# Patient Record
Sex: Male | Born: 1950 | Race: White | Hispanic: No | Marital: Married | Smoking: Never smoker
Health system: Southern US, Community
[De-identification: ages and names within clinical notes are randomized; demographics above are authoritative.]

## PROBLEM LIST (undated history)

## (undated) DIAGNOSIS — I10 Essential (primary) hypertension: Secondary | ICD-10-CM

## (undated) DIAGNOSIS — K8020 Calculus of gallbladder without cholecystitis without obstruction: Secondary | ICD-10-CM

## (undated) DIAGNOSIS — E119 Type 2 diabetes mellitus without complications: Secondary | ICD-10-CM

## (undated) DIAGNOSIS — F419 Anxiety disorder, unspecified: Secondary | ICD-10-CM

## (undated) DIAGNOSIS — I2510 Atherosclerotic heart disease of native coronary artery without angina pectoris: Secondary | ICD-10-CM

## (undated) DIAGNOSIS — I509 Heart failure, unspecified: Secondary | ICD-10-CM

## (undated) DIAGNOSIS — K635 Polyp of colon: Secondary | ICD-10-CM

## (undated) DIAGNOSIS — J45909 Unspecified asthma, uncomplicated: Secondary | ICD-10-CM

## (undated) DIAGNOSIS — I5030 Unspecified diastolic (congestive) heart failure: Secondary | ICD-10-CM

## (undated) DIAGNOSIS — G4730 Sleep apnea, unspecified: Secondary | ICD-10-CM

## (undated) DIAGNOSIS — I4891 Unspecified atrial fibrillation: Secondary | ICD-10-CM

## (undated) DIAGNOSIS — J189 Pneumonia, unspecified organism: Secondary | ICD-10-CM

## (undated) DIAGNOSIS — D509 Iron deficiency anemia, unspecified: Secondary | ICD-10-CM

## (undated) DIAGNOSIS — N189 Chronic kidney disease, unspecified: Secondary | ICD-10-CM

## (undated) DIAGNOSIS — E785 Hyperlipidemia, unspecified: Secondary | ICD-10-CM

## (undated) DIAGNOSIS — E669 Obesity, unspecified: Secondary | ICD-10-CM

## (undated) HISTORY — PX: UPPER GASTROINTESTINAL ENDOSCOPY: SHX188

## (undated) HISTORY — DX: Hyperlipidemia, unspecified: E78.5

## (undated) HISTORY — DX: Calculus of gallbladder without cholecystitis without obstruction: K80.20

## (undated) HISTORY — DX: Polyp of colon: K63.5

## (undated) HISTORY — PX: CORONARY STENT PLACEMENT: SHX1402

## (undated) HISTORY — DX: Sleep apnea, unspecified: G47.30

## (undated) HISTORY — DX: Chronic kidney disease, unspecified: N18.9

## (undated) HISTORY — DX: Heart failure, unspecified: I50.9

## (undated) HISTORY — DX: Iron deficiency anemia, unspecified: D50.9

## (undated) HISTORY — DX: Obesity, unspecified: E66.9

## (undated) HISTORY — DX: Anxiety disorder, unspecified: F41.9

## (undated) HISTORY — DX: Pneumonia, unspecified organism: J18.9

## (undated) HISTORY — PX: TONSILLECTOMY: SUR1361

## (undated) HISTORY — PX: COLONOSCOPY: SHX174

## (undated) HISTORY — DX: Unspecified diastolic (congestive) heart failure: I50.30

---

## 2006-05-05 ENCOUNTER — Emergency Department (HOSPITAL_COMMUNITY): Admission: EM | Admit: 2006-05-05 | Discharge: 2006-05-05 | Payer: Self-pay | Admitting: Emergency Medicine

## 2012-07-13 DIAGNOSIS — I4891 Unspecified atrial fibrillation: Secondary | ICD-10-CM | POA: Insufficient documentation

## 2012-08-15 DIAGNOSIS — N529 Male erectile dysfunction, unspecified: Secondary | ICD-10-CM | POA: Insufficient documentation

## 2012-08-15 DIAGNOSIS — R001 Bradycardia, unspecified: Secondary | ICD-10-CM | POA: Insufficient documentation

## 2012-08-15 DIAGNOSIS — M2550 Pain in unspecified joint: Secondary | ICD-10-CM | POA: Insufficient documentation

## 2012-08-15 DIAGNOSIS — E119 Type 2 diabetes mellitus without complications: Secondary | ICD-10-CM | POA: Insufficient documentation

## 2012-08-15 DIAGNOSIS — E65 Localized adiposity: Secondary | ICD-10-CM | POA: Insufficient documentation

## 2012-08-15 DIAGNOSIS — F329 Major depressive disorder, single episode, unspecified: Secondary | ICD-10-CM | POA: Insufficient documentation

## 2012-08-15 DIAGNOSIS — R609 Edema, unspecified: Secondary | ICD-10-CM | POA: Insufficient documentation

## 2012-08-15 DIAGNOSIS — E785 Hyperlipidemia, unspecified: Secondary | ICD-10-CM | POA: Insufficient documentation

## 2012-08-15 DIAGNOSIS — I10 Essential (primary) hypertension: Secondary | ICD-10-CM | POA: Insufficient documentation

## 2012-08-15 DIAGNOSIS — I2510 Atherosclerotic heart disease of native coronary artery without angina pectoris: Secondary | ICD-10-CM | POA: Insufficient documentation

## 2012-08-15 DIAGNOSIS — E559 Vitamin D deficiency, unspecified: Secondary | ICD-10-CM | POA: Insufficient documentation

## 2013-02-19 ENCOUNTER — Emergency Department (HOSPITAL_COMMUNITY)
Admission: EM | Admit: 2013-02-19 | Discharge: 2013-02-19 | Disposition: A | Discharge status: Home / Self Care | Payer: 59 | Attending: Emergency Medicine | Admitting: Emergency Medicine

## 2013-02-19 ENCOUNTER — Encounter (HOSPITAL_COMMUNITY): Payer: Self-pay | Admitting: *Deleted

## 2013-02-19 DIAGNOSIS — Z79899 Other long term (current) drug therapy: Secondary | ICD-10-CM | POA: Insufficient documentation

## 2013-02-19 DIAGNOSIS — R209 Unspecified disturbances of skin sensation: Secondary | ICD-10-CM | POA: Insufficient documentation

## 2013-02-19 DIAGNOSIS — Z9861 Coronary angioplasty status: Secondary | ICD-10-CM | POA: Insufficient documentation

## 2013-02-19 DIAGNOSIS — Z8679 Personal history of other diseases of the circulatory system: Secondary | ICD-10-CM | POA: Insufficient documentation

## 2013-02-19 DIAGNOSIS — Z794 Long term (current) use of insulin: Secondary | ICD-10-CM | POA: Insufficient documentation

## 2013-02-19 DIAGNOSIS — I10 Essential (primary) hypertension: Secondary | ICD-10-CM | POA: Insufficient documentation

## 2013-02-19 DIAGNOSIS — J45909 Unspecified asthma, uncomplicated: Secondary | ICD-10-CM | POA: Insufficient documentation

## 2013-02-19 DIAGNOSIS — E119 Type 2 diabetes mellitus without complications: Secondary | ICD-10-CM | POA: Insufficient documentation

## 2013-02-19 DIAGNOSIS — I2510 Atherosclerotic heart disease of native coronary artery without angina pectoris: Secondary | ICD-10-CM | POA: Insufficient documentation

## 2013-02-19 DIAGNOSIS — M542 Cervicalgia: Secondary | ICD-10-CM | POA: Insufficient documentation

## 2013-02-19 HISTORY — DX: Type 2 diabetes mellitus without complications: E11.9

## 2013-02-19 HISTORY — DX: Unspecified atrial fibrillation: I48.91

## 2013-02-19 HISTORY — DX: Essential (primary) hypertension: I10

## 2013-02-19 HISTORY — DX: Atherosclerotic heart disease of native coronary artery without angina pectoris: I25.10

## 2013-02-19 HISTORY — DX: Unspecified asthma, uncomplicated: J45.909

## 2013-02-19 MED ORDER — CYCLOBENZAPRINE HCL 10 MG PO TABS
10.0000 mg | ORAL_TABLET | Freq: Two times a day (BID) | ORAL | Status: DC | PRN
Start: 2013-02-19 — End: 2023-09-01

## 2013-02-19 MED ORDER — HYDROCODONE-ACETAMINOPHEN 5-325 MG PO TABS: 2.0000 | ORAL_TABLET | ORAL | Status: DC | PRN

## 2013-02-19 NOTE — ED Provider Notes (Signed)
History     CSN: [id number]  Arrival date & time 02/19/13  0721   First MD Initiated Contact with Patient 02/19/13 [phone]      Chief Complaint  Patient presents with  . Shoulder Pain     HPI Pt states woke up with right shoulder pain yesterday and today now has numbness. Pt reports that pain hurts with movement, but concerned because he does have heart problems.  Past Medical History  Diagnosis Date  . Coronary artery disease   . Diabetes mellitus without complication   . Hypertension   . Atrial fibrillation   . Asthma     Past Surgical History  Procedure Laterality Date  . Coronary stent placement    . Tonsillectomy      No family history on file.  History  Substance Use Topics  . Smoking status: Never Smoker   . Smokeless tobacco: Not on file  . Alcohol Use: No      Review of Systems All other systems reviewed and are negative Allergies  Glucophage  Home Medications   Current Outpatient Rx  Name  Route  Sig  Dispense  Refill  . amiodarone (PACERONE) 200 MG tablet   Oral   Take 200 mg by mouth daily.         Marland Kitchen apixaban (ELIQUIS) 5 MG TABS tablet   Oral   Take 5 mg by mouth 2 (two) times daily.         Marland Kitchen atorvastatin (LIPITOR) 40 MG tablet   Oral   Take 40 mg by mouth daily.         . cholecalciferol (VITAMIN D) 1000 UNITS tablet   Oral   Take 2,000 Units by mouth daily.         . insulin NPH (HUMULIN N,NOVOLIN N) 100 UNIT/ML injection   Subcutaneous   Inject 40 Units into the skin 2 (two) times daily.         . insulin regular (HUMULIN R) 100 units/mL injection   Subcutaneous   Inject 20-30 Units into the skin 3 (three) times daily before meals. 30 units in am, 20 units at lunch and 30 unkits pm         . irbesartan (AVAPRO) 150 MG tablet   Oral   Take 150 mg by mouth daily.         . cyclobenzaprine (FLEXERIL) 10 MG tablet   Oral   Take 1 tablet (10 mg total) by mouth 2 (two) times daily as needed for muscle spasms.   20  tablet   0   . HYDROcodone-acetaminophen (NORCO/VICODIN) 5-325 MG per tablet   Oral   Take 2 tablets by mouth every 4 (four) hours as needed for pain.   10 tablet   0     BP 184/46  Pulse 61  Temp(Src) 97.5 F (36.4 C) (Oral)  Resp 18  SpO2 94%  Physical Exam  Nursing note and vitals reviewed. Constitutional: He is oriented to person, place, and time. He appears well-developed and well-nourished. No distress.  HENT:  Head: Normocephalic and atraumatic.  Eyes: Pupils are equal, round, and reactive to light.  Neck: Normal range of motion. Muscular tenderness present. No spinous process tenderness present. No mass present.    Pain is reproducible to palpation were indicated  Cardiovascular: Normal rate and intact distal pulses.   Pulmonary/Chest: No respiratory distress.  Abdominal: Normal appearance. He exhibits no distension.  Musculoskeletal: Normal range of motion.  Neurological: He is alert  and oriented to person, place, and time. No cranial nerve deficit.  Skin: Skin is warm and dry. No rash noted.  Psychiatric: He has a normal mood and affect. His behavior is normal.    ED Course  Procedures (including critical care time)  Date: 02/19/2013  Rate: 60  Rhythm: normal sinus rhythm  QRS Axis: Left   Intervals: normal  ST/T Wave abnormalities: normal  Conduction Disutrbances: none  Narrative Interpretation: Abnormal EKG     Labs Reviewed - No data to display No results found.   1. Musculoskeletal neck pain       MDM         Nelia Shi, MD 02/19/13 [phone]

## 2013-02-19 NOTE — ED Notes (Signed)
Pt states woke up with right shoulder pain yesterday and today now has numbness.  Pt reports that pain hurts with movement, but concerned because he does have heart problems.

## 2013-02-19 NOTE — ED Notes (Signed)
NAD noted at time of d/c home 

## 2013-02-21 ENCOUNTER — Emergency Department (HOSPITAL_COMMUNITY)
Admission: EM | Admit: 2013-02-21 | Discharge: 2013-02-21 | Disposition: A | Discharge status: Home / Self Care | Payer: 59 | Attending: Emergency Medicine | Admitting: Emergency Medicine

## 2013-02-21 ENCOUNTER — Emergency Department (HOSPITAL_COMMUNITY): Payer: 59

## 2013-02-21 ENCOUNTER — Encounter (HOSPITAL_COMMUNITY): Payer: Self-pay | Admitting: *Deleted

## 2013-02-21 DIAGNOSIS — Z794 Long term (current) use of insulin: Secondary | ICD-10-CM | POA: Insufficient documentation

## 2013-02-21 DIAGNOSIS — M25519 Pain in unspecified shoulder: Secondary | ICD-10-CM | POA: Insufficient documentation

## 2013-02-21 DIAGNOSIS — E119 Type 2 diabetes mellitus without complications: Secondary | ICD-10-CM | POA: Insufficient documentation

## 2013-02-21 DIAGNOSIS — M542 Cervicalgia: Secondary | ICD-10-CM | POA: Insufficient documentation

## 2013-02-21 DIAGNOSIS — J45909 Unspecified asthma, uncomplicated: Secondary | ICD-10-CM | POA: Insufficient documentation

## 2013-02-21 DIAGNOSIS — Z8679 Personal history of other diseases of the circulatory system: Secondary | ICD-10-CM | POA: Insufficient documentation

## 2013-02-21 DIAGNOSIS — Z9861 Coronary angioplasty status: Secondary | ICD-10-CM | POA: Insufficient documentation

## 2013-02-21 DIAGNOSIS — I10 Essential (primary) hypertension: Secondary | ICD-10-CM | POA: Insufficient documentation

## 2013-02-21 DIAGNOSIS — M25511 Pain in right shoulder: Secondary | ICD-10-CM

## 2013-02-21 DIAGNOSIS — I2510 Atherosclerotic heart disease of native coronary artery without angina pectoris: Secondary | ICD-10-CM | POA: Insufficient documentation

## 2013-02-21 DIAGNOSIS — Z79899 Other long term (current) drug therapy: Secondary | ICD-10-CM | POA: Insufficient documentation

## 2013-02-21 LAB — CBC WITH DIFFERENTIAL/PLATELET
Eosinophils Relative: 2 % (ref 0–5)
HCT: 41.4 % (ref 39.0–52.0)
Lymphocytes Relative: 30 % (ref 12–46)
MCH: 27.9 pg (ref 26.0–34.0)
MCV: 82.6 fL (ref 78.0–100.0)
Monocytes Absolute: 1 10*3/uL (ref 0.1–1.0)
Monocytes Relative: 9 % (ref 3–12)
Neutro Abs: 5.9 10*3/uL (ref 1.7–7.7)
Neutrophils Relative %: 58 % (ref 43–77)
Platelets: 304 10*3/uL (ref 150–400)
RDW: 13.7 % (ref 11.5–15.5)

## 2013-02-21 LAB — BASIC METABOLIC PANEL
BUN: 16 mg/dL (ref 6–23)
Creatinine, Ser: 1.18 mg/dL (ref 0.50–1.35)

## 2013-02-21 MED ORDER — HYDROCODONE-ACETAMINOPHEN 5-325 MG PO TABS: 1.0000 | ORAL_TABLET | Freq: Four times a day (QID) | ORAL | Status: DC | PRN

## 2013-02-21 MED ORDER — PREDNISONE 20 MG PO TABS: 40.0000 mg | ORAL_TABLET | Freq: Every day | ORAL | Status: DC

## 2013-02-21 NOTE — ED Provider Notes (Signed)
History     CSN: [id number]  Arrival date & time 02/21/13  1302   First MD Initiated Contact with Patient 02/21/13 1505      Chief Complaint  Patient presents with  . Shoulder Pain  . Neck Pain    (Consider location/radiation/quality/duration/timing/severity/associated sxs/prior treatment) Patient is a 62 y.o. male presenting with shoulder pain and neck pain.  Shoulder Pain  Neck Pain  Pt with history of CAD and atrial fibrillation reports he woke up about 2 days ago with moderate to severe aching R shoulder pain, radiating into R neck. He was seen for similar 2 days ago and treated for presumed muscle spasm with Norco and Flexeril. He has not had much improvement. Wife reports he is inconsistently compliant with Lasix, typically goes days without taking his daily dose and then takes several doses back to back. He denies any trauma, no previous history of pain in this area. Different from cardiac pain with his stent. He has some paresthesia to R shoulder, but no pain or numbness radiating down the arm.   Past Medical History  Diagnosis Date  . Coronary artery disease   . Diabetes mellitus without complication   . Hypertension   . Atrial fibrillation   . Asthma     Past Surgical History  Procedure Laterality Date  . Coronary stent placement    . Tonsillectomy      History reviewed. No pertinent family history.  History  Substance Use Topics  . Smoking status: Never Smoker   . Smokeless tobacco: Not on file  . Alcohol Use: No      Review of Systems  HENT: Positive for neck pain.    All other systems reviewed and are negative except as noted in HPI.   Allergies  Glucophage  Home Medications   Current Outpatient Rx  Name  Route  Sig  Dispense  Refill  . amiodarone (PACERONE) 200 MG tablet   Oral   Take 200 mg by mouth daily.         Marland Kitchen apixaban (ELIQUIS) 5 MG TABS tablet   Oral   Take 5 mg by mouth 2 (two) times daily.         Marland Kitchen atorvastatin  (LIPITOR) 40 MG tablet   Oral   Take 40 mg by mouth daily.         . cholecalciferol (VITAMIN D) 1000 UNITS tablet   Oral   Take 2,000 Units by mouth daily.         . cyclobenzaprine (FLEXERIL) 10 MG tablet   Oral   Take 1 tablet (10 mg total) by mouth 2 (two) times daily as needed for muscle spasms.   20 tablet   0   . furosemide (LASIX) 40 MG tablet   Oral   Take 40 mg by mouth daily.         Marland Kitchen HYDROcodone-acetaminophen (NORCO/VICODIN) 5-325 MG per tablet   Oral   Take 2 tablets by mouth every 4 (four) hours as needed for pain.   10 tablet   0   . insulin NPH (HUMULIN N,NOVOLIN N) 100 UNIT/ML injection   Subcutaneous   Inject 40 Units into the skin 2 (two) times daily. 30 units in the morning.  40-50 units in the evening.         . insulin regular (HUMULIN R) 100 units/mL injection   Subcutaneous   Inject 20 Units into the skin 3 (three) times daily before meals. 20 unit morning, 40 units  lunch, and 30 units in the evening.         . irbesartan (AVAPRO) 150 MG tablet   Oral   Take 150 mg by mouth daily.           BP 156/70  Pulse 55  Temp(Src) 98 F (36.7 C) (Oral)  Resp 23  Ht 5\' 8"  (1.727 m)  Wt 275 lb (124.739 kg)  BMI 41.82 kg/m2  SpO2 95%  Physical Exam  Nursing note and vitals reviewed. Constitutional: He is oriented to person, place, and time. He appears well-developed and well-nourished.  HENT:  Head: Normocephalic and atraumatic.  Eyes: EOM are normal. Pupils are equal, round, and reactive to light.  Neck: Normal range of motion. Neck supple.  Cardiovascular: Normal rate, normal heart sounds and intact distal pulses.   Pulmonary/Chest: Effort normal and breath sounds normal.  Abdominal: Bowel sounds are normal. He exhibits no distension. There is no tenderness.  Musculoskeletal: Normal range of motion. He exhibits edema (bilatateral LE mild) and tenderness (R AC joint and soft tissue of supraclavicular and trapezius area, no bony  cervical spine tenderness).  Neurological: He is alert and oriented to person, place, and time. He has normal strength. No cranial nerve deficit or sensory deficit.  Skin: Skin is warm and dry. No rash noted.  Psychiatric: He has a normal mood and affect.    ED Course  Procedures (including critical care time)  Labs Reviewed  BASIC METABOLIC PANEL - Abnormal; Notable for the following:    GFR calc non Af Amer 63 (*)    GFR calc Af Amer 74 (*)    All other components within normal limits  CBC WITH DIFFERENTIAL   Dg Cervical Spine Complete  02/21/2013  *RADIOLOGY REPORT*  Clinical Data: Shoulder pain and neck pain.  CERVICAL SPINE - COMPLETE 4+ VIEW  Comparison: None.  Findings: Mild to moderate multilevel disc space narrowing, worst at C6-C7.  Foraminal narrowing is worst at C6-C7 on the right. Lung apices clear.  Bilateral carotid calcification.  Negative odontoid.  No osseous destruction.  IMPRESSION:  Spondylosis as described.   Original Report Authenticated By: Davonna Belling, M.D.    Dg Shoulder Right  02/21/2013  *RADIOLOGY REPORT*  Clinical Data: Right shoulder and neck pain for 3 days, no known injury  RIGHT SHOULDER - 2+ VIEW  Comparison: None  Findings: Mild AC joint degenerative changes. Osseous mineralization grossly normal. No acute fracture, dislocation or bone destruction. Visualized right ribs intact.  IMPRESSION: No acute abnormalities. Mild degenerative changes right AC joint.   Original Report Authenticated By: Ulyses Southward, M.D.      1. Shoulder pain, right       MDM  Labs unremarkable. Xrays as above show some arthritis. I suspect this is the cause of his pain which is clearly musculoskeletal. Could be a radiculopathy component as well. Continue with hydrocodone, short course of steroids. PCP followup already scheduled for next week.        Melis Trochez B. Bernette Mayers, MD 02/21/13 [id number]

## 2013-02-21 NOTE — ED Notes (Signed)
Pt was seen here Tues am for R shoulder pain and numbness and d/c'd with pain r/t nerves.  Pain increases with movement.  Pt is on his last hydrocodone and the pain is not improving, so pt would like to figure out what is going on.  States his wife thinks pain is r/t low K b/c pt is on lasix but no labs were drawn last time pt was here.

## 2013-02-21 NOTE — ED Notes (Signed)
Pt being transported to x-ray

## 2013-08-14 DIAGNOSIS — Z6841 Body Mass Index (BMI) 40.0 and over, adult: Secondary | ICD-10-CM | POA: Insufficient documentation

## 2014-08-21 DIAGNOSIS — Z79899 Other long term (current) drug therapy: Secondary | ICD-10-CM | POA: Insufficient documentation

## 2014-11-01 IMAGING — CR DG CERVICAL SPINE COMPLETE 4+V
5 series · 5 of 5 positions shown · non-contrast
Comparison: None.

CLINICAL DATA: Shoulder pain and neck pain.

CERVICAL SPINE - COMPLETE 4+ VIEW

[w c-spine lat *]
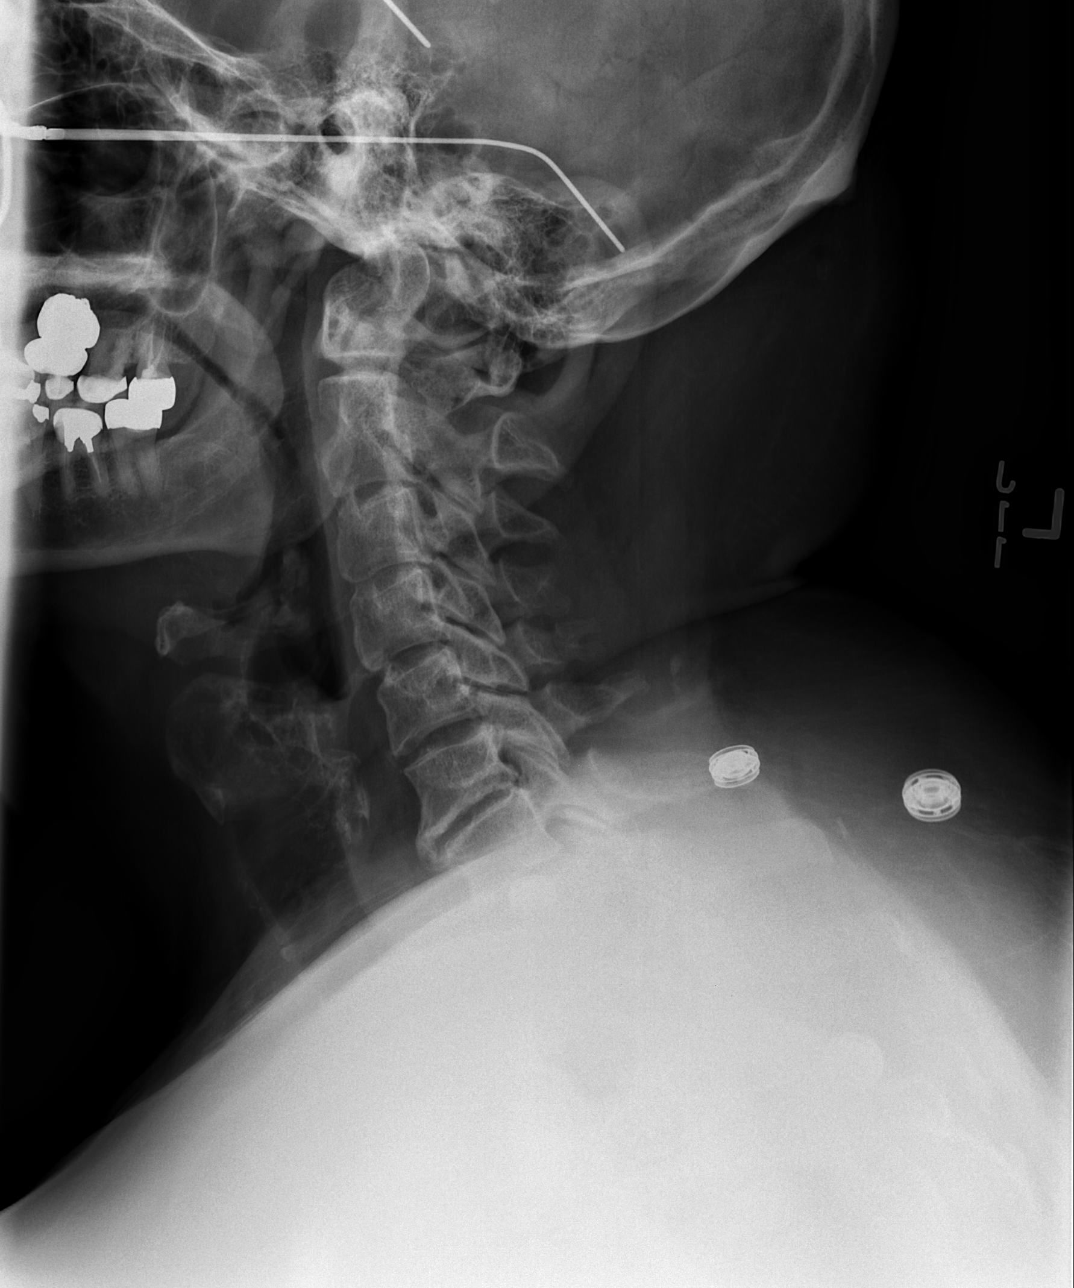

[w c-spine oblique (1 of 2)]
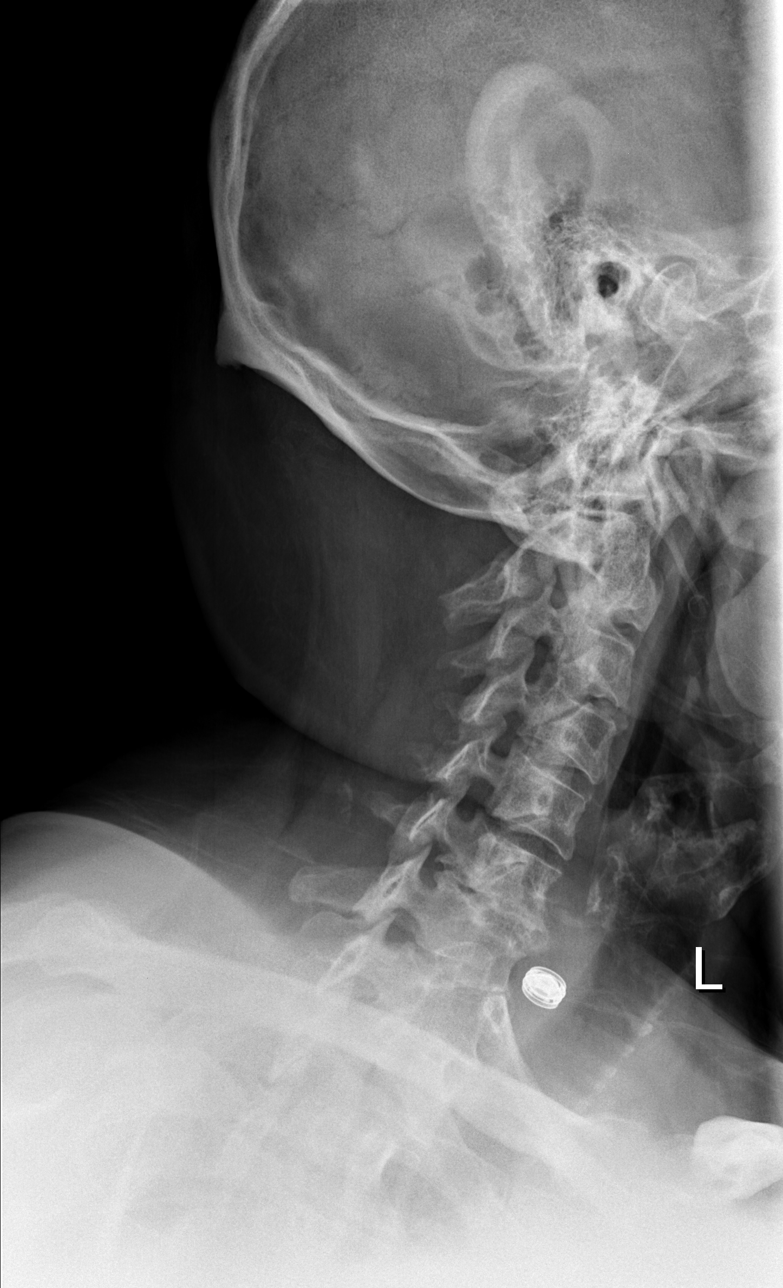

[w c-spine oblique (2 of 2)]
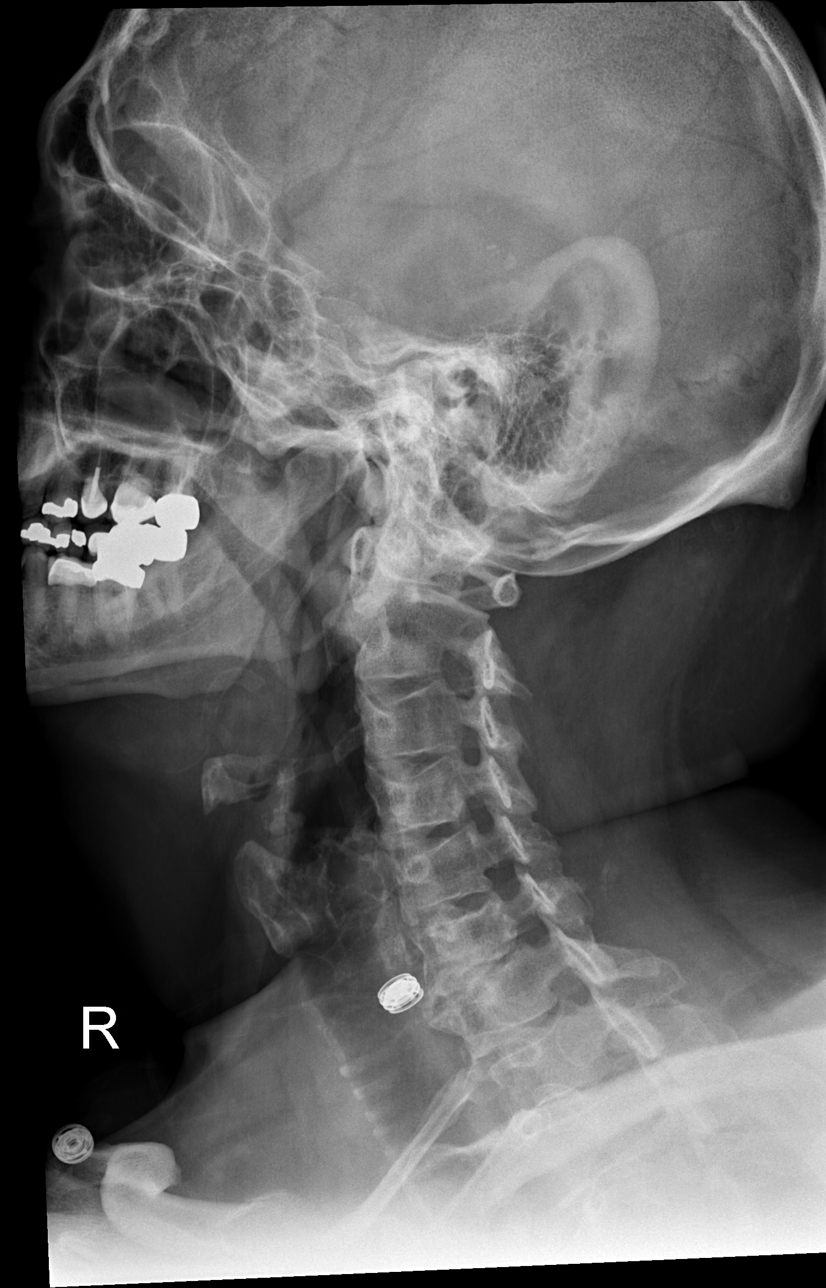

[w c-spine a.p.]
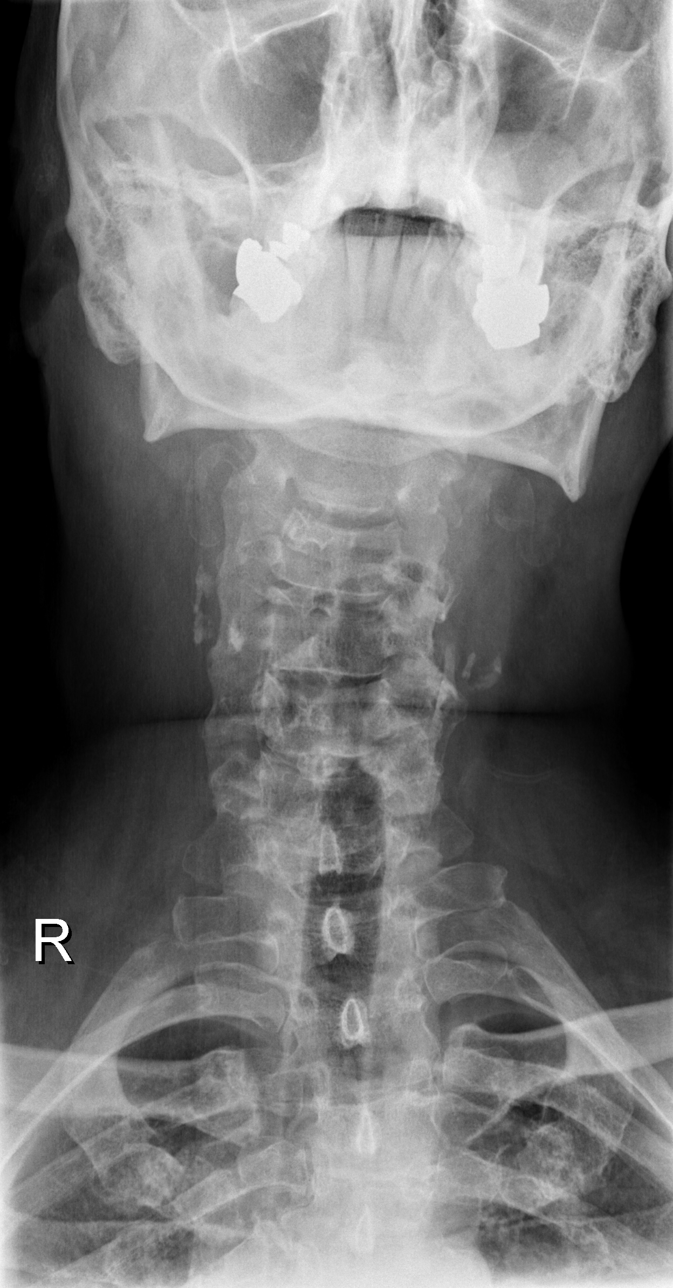

[w c-spine odontoid *]
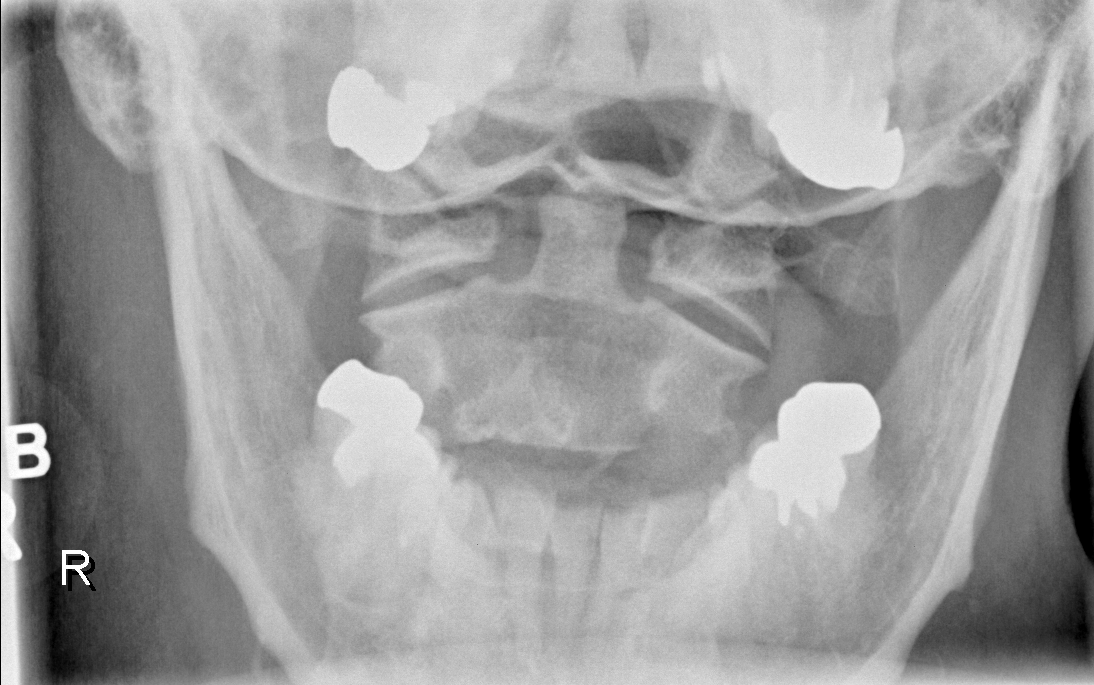

[5 of 5 positions shown; findings below may reference images not displayed]

FINDINGS: Mild to moderate multilevel disc space narrowing, worst
at C6-C7.  Foraminal narrowing is worst at C6-C7 on the right.
Lung apices clear.  Bilateral carotid calcification.  Negative
odontoid.  No osseous destruction.
IMPRESSION: Spondylosis as described.

## 2015-09-29 DIAGNOSIS — G4733 Obstructive sleep apnea (adult) (pediatric): Secondary | ICD-10-CM | POA: Insufficient documentation

## 2016-05-04 DIAGNOSIS — Z955 Presence of coronary angioplasty implant and graft: Secondary | ICD-10-CM | POA: Insufficient documentation

## 2017-04-19 DIAGNOSIS — R0602 Shortness of breath: Secondary | ICD-10-CM | POA: Insufficient documentation

## 2018-09-21 ENCOUNTER — Ambulatory Visit: Payer: Medicare Other | Admitting: Podiatry

## 2018-09-21 ENCOUNTER — Encounter: Payer: Self-pay | Admitting: Podiatry

## 2018-09-21 ENCOUNTER — Other Ambulatory Visit: Payer: Self-pay

## 2018-09-21 VITALS — BP 127/58 | HR 51

## 2018-09-21 DIAGNOSIS — M79675 Pain in left toe(s): Secondary | ICD-10-CM

## 2018-09-21 DIAGNOSIS — M79674 Pain in right toe(s): Secondary | ICD-10-CM

## 2018-09-21 DIAGNOSIS — E1142 Type 2 diabetes mellitus with diabetic polyneuropathy: Secondary | ICD-10-CM | POA: Diagnosis not present

## 2018-09-21 DIAGNOSIS — L84 Corns and callosities: Secondary | ICD-10-CM

## 2018-09-21 DIAGNOSIS — B351 Tinea unguium: Secondary | ICD-10-CM

## 2018-09-21 NOTE — Patient Instructions (Addendum)
Diabetes and Foot Care Diabetes may cause you to have problems because of poor blood supply (circulation) to your feet and legs. This may cause the skin on your feet to become thinner, break easier, and heal more slowly. Your skin may become dry, and the skin may peel and crack. You may also have nerve damage in your legs and feet causing decreased feeling in them. You may not notice minor injuries to your feet that could lead to infections or more serious problems. Taking care of your feet is one of the most important things you can do for yourself. Follow these instructions at home:  Wear shoes at all times, even in the house. Do not go barefoot. Bare feet are easily injured.  Check your feet daily for blisters, cuts, and redness. If you cannot see the bottom of your feet, use a mirror or ask someone for help.  Wash your feet with warm water (do not use hot water) and mild soap. Then pat your feet and the areas between your toes until they are completely dry. Do not soak your feet as this can dry your skin.  Apply a moisturizing lotion or petroleum jelly (that does not contain alcohol and is unscented) to the skin on your feet and to dry, brittle toenails. Do not apply lotion between your toes.  Trim your toenails straight across. Do not dig under them or around the cuticle. File the edges of your nails with an emery board or nail file.  Do not cut corns or calluses or try to remove them with medicine.  Wear clean socks or stockings every day. Make sure they are not too tight. Do not wear knee-high stockings since they may decrease blood flow to your legs.  Wear shoes that fit properly and have enough cushioning. To break in new shoes, wear them for just a few hours a day. This prevents you from injuring your feet. Always look in your shoes before you put them on to be sure there are no objects inside.  Do not cross your legs. This may decrease the blood flow to your feet.  If you find a  minor scrape, cut, or break in the skin on your feet, keep it and the skin around it clean and dry. These areas may be cleansed with mild soap and water. Do not cleanse the area with peroxide, alcohol, or iodine.  When you remove an adhesive bandage, be sure not to damage the skin around it.  If you have a wound, look at it several times a day to make sure it is healing.  Do not use heating pads or hot water bottles. They may burn your skin. If you have lost feeling in your feet or legs, you may not know it is happening until it is too late.  Make sure your health care provider performs a complete foot exam at least annually or more often if you have foot problems. Report any cuts, sores, or bruises to your health care provider immediately. Contact a health care provider if:  You have an injury that is not healing.  You have cuts or breaks in the skin.  You have an ingrown nail.  You notice redness on your legs or feet.  You feel burning or tingling in your legs or feet.  You have pain or cramps in your legs and feet.  Your legs or feet are numb.  Your feet always feel cold. Get help right away if:  There is increasing   redness, swelling, or pain in or around a wound.  There is a red line that goes up your leg.  Pus is coming from a wound.  You develop a fever or as directed by your health care provider.  You notice a bad smell coming from an ulcer or wound. This information is not intended to replace advice given to you by your health care provider. Make sure you discuss any questions you have with your health care provider. Document Released: 11/04/2000 Document Revised: 04/14/2016 Document Reviewed: 04/16/2013 Elsevier Interactive Patient Education  2017 [employer]  Diabetic Neuropathy Diabetic neuropathy is a nerve disease or nerve damage that is caused by diabetes mellitus. About half of all people with diabetes mellitus have some form of nerve damage. Nerve damage  is more common in those who have had diabetes mellitus for many years and who generally have not had good control of their blood sugar (glucose) level. Diabetic neuropathy is a common complication of diabetes mellitus. There are three common types of diabetic neuropathy and a fourth type that is less common and less understood:  Peripheral neuropathy-This is the most common type of diabetic neuropathy. It causes damage to the nerves of the feet and legs first and then eventually the hands and arms. The damage affects the ability to sense touch.  Autonomic neuropathy-This type causes damage to the autonomic nervous system, which controls the following functions: ? Heartbeat. ? Body temperature. ? Blood pressure. ? Urination. ? Digestion. ? Sweating. ? Sexual function.  Focal neuropathy-Focal neuropathy can be painful and unpredictable and occurs most often in older adults with diabetes mellitus. It involves a specific nerve or one area and often comes on suddenly. It usually does not cause long-term problems.  Radiculoplexus neuropathy- Sometimes called lumbosacral radiculoplexus neuropathy, radiculoplexus neuropathy affects the nerves of the thighs, hips, buttocks, or legs. It is more common in people with type 2 diabetes mellitus and in older men. It is characterized by debilitating pain, weakness, and atrophy, usually in the thigh muscles.  What are the causes? The cause of peripheral, autonomic, and focal neuropathies is diabetes mellitus that is uncontrolled and high glucose levels. The cause of radiculoplexus neuropathy is unknown. However, it is thought to be caused by inflammation related to uncontrolled glucose levels. What are the signs or symptoms? Peripheral Neuropathy Peripheral neuropathy develops slowly over time. When the nerves of the feet and legs no longer work there may be:  Burning, stabbing, or aching pain in the legs or feet.  Inability to feel pressure or pain in your  feet. This can lead to: ? Thick calluses over pressure areas. ? Pressure sores. ? Ulcers.  Foot deformities.  Reduced ability to feel temperature changes.  Muscle weakness.  Autonomic Neuropathy The symptoms of autonomic neuropathy vary depending on which nerves are affected. Symptoms may include:  Problems with digestion, such as: ? Feeling sick to your stomach (nausea). ? Vomiting. ? Bloating. ? Constipation. ? Diarrhea. ? Abdominal pain.  Difficulty with urination. This occurs if you lose your ability to sense when your bladder is full. Problems include: ? Urine leakage (incontinence). ? Inability to empty your bladder completely (retention).  Rapid or irregular heartbeat (palpitations).  Blood pressure drops when you stand up (orthostatic hypotension). When you stand up you may feel: ? Dizzy. ? Weak. ? Faint.  In men, inability to attain and maintain an erection.  In women, vaginal dryness and problems with decreased sexual desire and arousal.  Problems with body temperature   regulation.  Increased or decreased sweating.  Focal Neuropathy  Abnormal eye movements or abnormal alignment of both eyes.  Weakness in the wrist.  Foot drop. This results in an inability to lift the foot properly and abnormal walking or foot movement.  Paralysis on one side of your face (Bell palsy).  Chest or abdominal pain. Radiculoplexus Neuropathy  Sudden, severe pain in your hip, thigh, or buttocks.  Weakness and wasting of thigh muscles.  Difficulty rising from a seated position.  Abdominal swelling.  Unexplained weight loss (usually more than 10 lb [4.5 kg]). How is this diagnosed? Peripheral Neuropathy Your senses may be tested. Sensory function testing can be done with:  A light touch using a monofilament.  A vibration with tuning fork.  A sharp sensation with a pin prick.  Other tests that can help diagnose neuropathy are:  Nerve conduction velocity. This  test checks the transmission of an electrical current through a nerve.  Electromyography. This shows how muscles respond to electrical signals transmitted by nearby nerves.  Quantitative sensory testing. This is used to assess how your nerves respond to vibrations and changes in temperature.  Autonomic Neuropathy Diagnosis is often based on reported symptoms. Tell your health care provider if you experience:  Dizziness.  Constipation.  Diarrhea.  Inappropriate urination or inability to urinate.  Inability to get or maintain an erection.  Tests that may be done include:  Electrocardiography or Holter monitor. These are tests that can help show problems with the heart rate or heart rhythm.  An X-ray exam may be done.  Focal Neuropathy Diagnosis is made based on your symptoms and what your health care provider finds during your exam. Other tests may be done. They may include:  Nerve conduction velocities. This checks the transmission of electrical current through a nerve.  Electromyography. This shows how muscles respond to electrical signals transmitted by nearby nerves.  Quantitative sensory testing. This test is used to assess how your nerves respond to vibration and changes in temperature.  Radiculoplexus Neuropathy  Often the first thing is to eliminate any other issue or problems that might be the cause, as there is no standard test for diagnosis.  X-ray exam of your spine and lumbar region.  Spinal tap to rule out cancer.  MRI to rule out other lesions. How is this treated? Once nerve damage occurs, it cannot be reversed. The goal of treatment is to keep the disease or nerve damage from getting worse and affecting more nerve fibers. Controlling your blood glucose level is the key. Most people with radiculoplexus neuropathy see at least a partial improvement over time. You will need to keep your blood glucose and HbA1c levels in the target range determined by your  health care provider. Things that help control blood glucose levels include:  Blood glucose monitoring.  Meal planning.  Physical activity.  Diabetes medicine.  Over time, maintaining lower blood glucose levels helps lessen symptoms. Sometimes, prescription pain medicine is needed. Follow these instructions at home:  Do not smoke.  Keep your blood glucose level in the range that you and your health care provider have determined acceptable for you.  Keep your blood pressure level in the range that you and your health care provider have determined acceptable for you.  Eat a well-balanced diet.  Be physically active every day. Include strength training and balance exercises.  Protect your feet. ? Check your feet every day for sores, cuts, blisters, or signs of infection. ? Wear padded socks   and supportive shoes. Use orthotic inserts, if necessary. ? Regularly check the insides of your shoes for worn spots. Make sure there are no rocks or other items inside your shoes before you put them on. Contact a health care provider if:  You have burning, stabbing, or aching pain in the legs or feet.  You are unable to feel pressure or pain in your feet.  You develop problems with digestion such as: ? Nausea. ? Vomiting. ? Bloating. ? Constipation. ? Diarrhea. ? Abdominal pain.  You have difficulty with urination, such as: ? Incontinence. ? Retention.  You have palpitations.  You develop orthostatic hypotension. When you stand up you may feel: ? Dizzy. ? Weak. ? Faint.  You cannot attain and maintain an erection (in men).  You have vaginal dryness and problems with decreased sexual desire and arousal (in women).  You have severe pain in your thighs, legs, or buttocks.  You have unexplained weight loss. This information is not intended to replace advice given to you by your health care provider. Make sure you discuss any questions you have with your health care  provider. Document Released: 01/16/2002 Document Revised: 04/14/2016 Document Reviewed: 04/18/2013 Elsevier Interactive Patient Education  2017 [employer]  

## 2018-10-08 ENCOUNTER — Encounter: Payer: Self-pay | Admitting: Podiatry

## 2018-10-08 NOTE — Progress Notes (Signed)
Subjective: George Boyer presents to clinic today with  cc calluses bilateral great toes and second and third digits.  Patient states condition is been present for some time.  He became more concerned because of his diabetes.   He also relates long-standing problem with elongated, painful, discolored, thick toenails both feet which interfere with daily activities.  Pain is aggravated when wearing enclosed shoe gear. Pain is getting progressively worse.  His primary care doctor is Dr. Teodoro Spray in [place].  Last visit with Dr. Karen Kitchens was May 03, 2018.  Medical History   Date Unknown Asthma  Date Unknown Atrial fibrillation [facility])  Date Unknown Coronary artery disease  Date Unknown Diabetes mellitus without complication (HCC)  Date Unknown Hypertension  Mark as Reviewed   Cardiovascular and Mediastinum  CAD in native artery   HTN (hypertension)   Unspecified atrial fibrillation (HCC)   Respiratory  Obstructive sleep apnea   Endocrine  T2DM (type 2 diabetes mellitus) (HCC)   Other  Anxiety and depression   Arthralgia   Body mass index 40.0-44.9, adult (HCC)   Bradycardia   Central obesity   Dependent edema   Dyslipidemia   Erectile dysfunction   High risk medication use   S/P coronary artery stent placement   SOB (shortness of breath) on exertion   Vitamin D deficiency    Surgical History   Date Unknown Coronary stent placement  Date Unknown Tonsillectomy   Medications    fluticasone (FLOVENT HFA) 220 MCG/ACT inhaler    nitroGLYCERIN (NITROSTAT) 0.4 MG SL tablet    ACCU-CHEK AVIVA PLUS test strip    amiodarone (PACERONE) 200 MG tablet    amLODipine (NORVASC) 5 MG tablet    apixaban (ELIQUIS) 5 MG TABS tablet    atorvastatin (LIPITOR) 40 MG tablet    benzonatate (TESSALON) 100 MG capsule    buPROPion (WELLBUTRIN XL) 150 MG 24 hr tablet    cholecalciferol (VITAMIN D) 1000 UNITS tablet    cyclobenzaprine (FLEXERIL) 10 MG tablet    furosemide (LASIX) 40 MG  tablet    HYDROcodone-acetaminophen (NORCO/VICODIN) 5-325 MG per tablet    hydrocortisone 2.5 % ointment    insulin NPH (HUMULIN N,NOVOLIN N) 100 UNIT/ML injection    insulin regular (HUMULIN R) 100 units/mL injection    irbesartan (AVAPRO) 150 MG tablet    pravastatin (PRAVACHOL) 20 MG tablet    predniSONE (DELTASONE) 20 MG tablet    ranolazine (RANEXA) 500 MG 12 hr tablet    Vitamin D, Ergocalciferol, (DRISDOL) 50000 units CAPS capsule    Allergies     BupropionOther (See Comments)  Glucophage [Metformin Hcl]Diarrhea   Tobacco History   Smoking Status  Never Smoker  Smokeless Tobacco Status  Never Used   Family History   None   Review of systems: Constitutional: Denies chills fatigue fever sweats weight change Eyes: Denies diplopia glare light sensitivity Ears nose mouth throat: Denies vertigo denies bloody nose rhinitis denies cold sores and snoring Cardiovascular: +HTN, Denies chest pain tightness Respiratory: +Dyspnea on exertion, +asthma, Denies difficulty breathing, denies congestion Gastrointestinal: Denies abdominal pain, diarrhea, nausea, vomiting Genitourinary: Denies nocturia, pain on urination, blood in urine Musculoskeletal: Denies cramping Skin: +changes in toenails, denies color change dryness, itchy skin, mole changes, or rash  Neurological: Denies fainting, denies seizure, denies change in speech.  Endocrine: Denies dry mouth, denies flushing, denies heat intolerance, denies cold intolerance, denies excessive thirst, denies polyuria, denies nocturia Hematological: Denies easy bleeding, excessive bleeding, easy bruising, denies enlarged lymph nodes Allergy/immunological:  Denies hives denies frequent infections . Objective: Vitals:   09/21/18 0802  BP: (!) 127/58  Pulse: (!) 51   Vascular Examination: Capillary refill time <3 seconds x 10 digits Dorsalis pedis pulses 2/4 bilaterally  Posterior tibial pulses are faintly palpable bilaterally b/l No digital  hair x 10 digits Skin temperature warm to warm b/l  Dermatological Examination: Skin with normal turgor, texture and tone bilaterally  Toenails 1-5 b/l discolored, thick, dystrophic with subungual debris and pain with palpation to nailbeds due to thickness of nails.  Hyperkeratotic lesions noted submetatarsal heads 1, 5 bilaterally distal tip left second and third digits, distal tip right second digit, plantar medial hallux IPJ bilaterally  Musculoskeletal: Muscle strength 5/5 to all LE muscle groups  Neurological: Sensation intact with 10 gram monofilament. Vibratory sensation diminished b/l  Assessment: 1. Painful onychomycosis toenails 1-5 b/l  2.  Calluses submetatarsal heads 1, 5 bilaterally , plantar medial hallux IPJ bilaterally 3.  Corns distal tip left second and third digits, distal tip right second digit 4. NIDDM with neuropathy  Plan: 1. Discussed diabetic foot care principles.  Literature was dispensed on diabetic foot care and diabetic neuropathy. 2. Toenails 1-5 b/l were debrided in length and girth without iatrogenic bleeding. 3. All hyperkeratotic were pared with sterile blade submetatarsal heads 1 formal bilaterally, medial hallux IPJ bilaterally, distal tip left third digit, distal tip bilateral second digits. 4. Patient does qualify for Medicare approved diabetic shoe gear based on his examination today.  Supporting diagnoses include NIDDM with neuropathy, multiple callosities as above.  Paperwork will be completed and forwarded to our orthotics and prosthetics department.  Once they receive certification from his primary care physician will be scheduled for an appointment to be measured for his shoes and insoles. 5. Patient to continue soft, supportive shoe gear 6. Patient to report any pedal injuries to medical professional immediately. 7. Follow up 3 months. Patient/POA to call should there be a concern in the interim.

## 2018-10-16 ENCOUNTER — Other Ambulatory Visit: Payer: Self-pay | Admitting: Internal Medicine

## 2018-10-16 DIAGNOSIS — N183 Chronic kidney disease, stage 3 unspecified: Secondary | ICD-10-CM

## 2018-10-24 ENCOUNTER — Ambulatory Visit
Admission: RE | Admit: 2018-10-24 | Discharge: 2018-10-24 | Disposition: A | Discharge status: Home / Self Care | Payer: Self-pay | Source: Ambulatory Visit | Attending: Internal Medicine | Admitting: Internal Medicine

## 2018-10-24 DIAGNOSIS — N183 Chronic kidney disease, stage 3 unspecified: Secondary | ICD-10-CM

## 2018-12-21 ENCOUNTER — Ambulatory Visit: Payer: Medicare Other | Admitting: Podiatry

## 2018-12-26 ENCOUNTER — Ambulatory Visit: Payer: Medicare Other | Admitting: Podiatry

## 2018-12-26 ENCOUNTER — Ambulatory Visit: Payer: Medicare Other | Admitting: Orthotics

## 2018-12-26 DIAGNOSIS — L84 Corns and callosities: Secondary | ICD-10-CM

## 2018-12-26 DIAGNOSIS — M79675 Pain in left toe(s): Secondary | ICD-10-CM | POA: Diagnosis not present

## 2018-12-26 DIAGNOSIS — E1142 Type 2 diabetes mellitus with diabetic polyneuropathy: Secondary | ICD-10-CM

## 2018-12-26 DIAGNOSIS — B351 Tinea unguium: Secondary | ICD-10-CM

## 2018-12-26 DIAGNOSIS — M79674 Pain in right toe(s): Principal | ICD-10-CM

## 2018-12-26 NOTE — Patient Instructions (Signed)

## 2018-12-30 ENCOUNTER — Encounter: Payer: Self-pay | Admitting: Podiatry

## 2018-12-30 NOTE — Progress Notes (Addendum)
Subjective: George Boyer to clinic for preventative foot care. He has h/o diabetic neuropathy.   He is seen for painful, mycotic toenails and corns and calluses. He is picking up his diabetic shoes on today as well.  Teodoro Spray, MD is his PCP and last visit was 05/03/2018.  He is also on Eliquis.   Current Outpatient Medications:  .  ACCU-CHEK AVIVA PLUS test strip, USE TO TEST BLOOD SUGARS FOUR TIMES DAILY DX E11.9 AND Z79.4, Disp: , Rfl: 1 .  amiodarone (PACERONE) 200 MG tablet, Take 200 mg by mouth daily., Disp: , Rfl:  .  amLODipine (NORVASC) 5 MG tablet, TK 1 T PO QAM, Disp: , Rfl: 0 .  apixaban (ELIQUIS) 5 MG TABS tablet, Take 5 mg by mouth 2 (two) times daily., Disp: , Rfl:  .  atorvastatin (LIPITOR) 40 MG tablet, Take 40 mg by mouth daily., Disp: , Rfl:  .  benzonatate (TESSALON) 100 MG capsule, TK ONE C PO TID FOR 10 DAYS, Disp: , Rfl: 0 .  buPROPion (WELLBUTRIN XL) 150 MG 24 hr tablet, TAKE 1 TABLET BY MOUTH EVERY DAY IN THE MORNING, Disp: , Rfl: 0 .  cholecalciferol (VITAMIN D) 1000 UNITS tablet, Take 2,000 Units by mouth daily., Disp: , Rfl:  .  cyclobenzaprine (FLEXERIL) 10 MG tablet, Take 1 tablet (10 mg total) by mouth 2 (two) times daily as needed for muscle spasms., Disp: 20 tablet, Rfl: 0 .  fluticasone (FLOVENT HFA) 220 MCG/ACT inhaler, Inhale into the lungs., Disp: , Rfl:  .  furosemide (LASIX) 40 MG tablet, Take 40 mg by mouth daily., Disp: , Rfl:  .  HYDROcodone-acetaminophen (NORCO/VICODIN) 5-325 MG per tablet, Take 1-2 tablets by mouth every 6 (six) hours as needed for pain., Disp: 30 tablet, Rfl: 0 .  hydrocortisone 2.5 % ointment, APP TOPICALLY AA BID FOR 7 DAYS, Disp: , Rfl: 0 .  insulin NPH (HUMULIN N,NOVOLIN N) 100 UNIT/ML injection, Inject 40 Units into the skin 2 (two) times daily. 30 units in the morning.  40-50 units in the evening., Disp: , Rfl:  .  insulin regular (HUMULIN R) 100 units/mL injection, Inject 20 Units into the skin 3 (three) times daily  before meals. 20 unit morning, 40 units lunch, and 30 units in the evening., Disp: , Rfl:  .  irbesartan (AVAPRO) 150 MG tablet, Take 150 mg by mouth daily., Disp: , Rfl:  .  nitroGLYCERIN (NITROSTAT) 0.4 MG SL tablet, Place under the tongue., Disp: , Rfl:  .  pravastatin (PRAVACHOL) 20 MG tablet, TK 1 T PO HS, Disp: , Rfl: 0 .  predniSONE (DELTASONE) 20 MG tablet, Take 2 tablets (40 mg total) by mouth daily., Disp: 10 tablet, Rfl: 0 .  ranolazine (RANEXA) 500 MG 12 hr tablet, TK 1 T PO BID, Disp: , Rfl: 11 .  Vitamin D, Ergocalciferol, (DRISDOL) 50000 units CAPS capsule, TK ONE C PO TWICE WEEKLY, Disp: , Rfl: 0  Allergies  Allergen Reactions  . Bupropion Other (See Comments)    Tremors  . Glucophage [Metformin Hcl] Diarrhea    Vascular Examination: Capillary refill time <3 seconds x 10 digits Dorsalis pedis 2/4 b/l Posterior tibial pulses faintly palpable b/l No digital hair x 10 digits Skin temperature WNL b/l  Dermatological Examination: Skin with normal turgor, texture and tone b/l Toenails 1-5 b/l discolored, thick, dystrophic with subungual debris and pain with palpation to nailbeds due to thickness of nails.  Hyperkeratotic lesion submetatarsal head 1, 5 b/l, distal tip left  2nd, 3rd digits, distal tip right 2nd digit and medial hallux IPJ b/l.  Musculoskeletal: Muscle strength 5/5 to all LE muscle groups  Neurological: Sensation intact with 10 gram monofilament b/l. Vibratory sensation diminished b/l.  Assessment: 1. Painful onychomycosis toenails 1-5 b/l 2. Callus/Corn submetatarsal head 1, 5 b/l, distal tip left 2nd, 3rd digits, distal tip right 2nd digit and medial hallux IPJ b/l 3. NIDDM with Diabetic neuropathy  Plan: 1. Continue diabetic foot care principles.  2. Toenails 1-5 b/l were debrided in length and girth without iatrogenic bleeding. 3. Hyperkeratotic lesion pared with sterile chisel blade submetatarsal head 1, 5 b/l, distal tip left 2nd, 3rd digits,  distal tip right 2nd digit and medial hallux IPJ b/l without incident.  4. Patient to continue soft, supportive shoe gear 5. Patient to report any pedal injuries to medical professional  6. Follow up 3 months.  7. Patient/POA to call should there be a concern in the interim.

## 2019-02-08 ENCOUNTER — Ambulatory Visit: Payer: Medicare Other | Admitting: Orthotics

## 2019-02-18 NOTE — Progress Notes (Signed)

## 2019-02-28 ENCOUNTER — Ambulatory Visit (INDEPENDENT_AMBULATORY_CARE_PROVIDER_SITE_OTHER): Payer: Medicare Other | Admitting: Orthotics

## 2019-02-28 ENCOUNTER — Other Ambulatory Visit: Payer: Self-pay

## 2019-02-28 DIAGNOSIS — E1142 Type 2 diabetes mellitus with diabetic polyneuropathy: Secondary | ICD-10-CM

## 2019-02-28 DIAGNOSIS — L84 Corns and callosities: Secondary | ICD-10-CM

## 2019-02-28 NOTE — Progress Notes (Signed)

## 2019-03-06 ENCOUNTER — Ambulatory Visit: Payer: Medicare Other | Admitting: Orthotics

## 2019-03-26 ENCOUNTER — Other Ambulatory Visit: Payer: Self-pay

## 2019-03-26 ENCOUNTER — Ambulatory Visit (INDEPENDENT_AMBULATORY_CARE_PROVIDER_SITE_OTHER): Payer: Medicare Other

## 2019-03-26 ENCOUNTER — Ambulatory Visit: Payer: Medicare Other | Admitting: Podiatry

## 2019-03-26 ENCOUNTER — Encounter: Payer: Self-pay | Admitting: Podiatry

## 2019-03-26 VITALS — Temp 97.7°F

## 2019-03-26 DIAGNOSIS — M79674 Pain in right toe(s): Secondary | ICD-10-CM

## 2019-03-26 DIAGNOSIS — L84 Corns and callosities: Secondary | ICD-10-CM | POA: Diagnosis not present

## 2019-03-26 DIAGNOSIS — M6740 Ganglion, unspecified site: Secondary | ICD-10-CM | POA: Diagnosis not present

## 2019-03-26 DIAGNOSIS — E1142 Type 2 diabetes mellitus with diabetic polyneuropathy: Secondary | ICD-10-CM

## 2019-03-26 DIAGNOSIS — B351 Tinea unguium: Secondary | ICD-10-CM

## 2019-03-26 DIAGNOSIS — M79675 Pain in left toe(s): Secondary | ICD-10-CM | POA: Diagnosis not present

## 2019-03-26 NOTE — Patient Instructions (Addendum)
Ganglion Cyst  A ganglion cyst is a non-cancerous, fluid-filled lump that occurs near a joint or tendon. The cyst grows out of a joint or the lining of a tendon. Ganglion cysts most often develop in the hand or wrist, but they can also develop in the shoulder, elbow, hip, knee, ankle, or foot. Ganglion cysts are ball-shaped or egg-shaped. Their size can range from the size of a pea to larger than a grape. Increased activity may cause the cyst to get bigger because more fluid starts to build up. What are the causes? The exact cause of this condition is not known, but it may be related to:  Inflammation or irritation around the joint.  An injury.  Repetitive movements or overuse.  Arthritis. What increases the risk? You are more likely to develop this condition if:  You are a woman.  You are 40-59 years old. What are the signs or symptoms? The main symptom of this condition is a lump. It most often appears on the hand or wrist. In many cases, there are no other symptoms, but a cyst can sometimes cause:  Tingling.  Pain.  Numbness.  Muscle weakness.  Weak grip.  Less range of motion in a joint. How is this diagnosed? Ganglion cysts are usually diagnosed based on a physical exam. Your health care provider will feel the lump and may shine a light next to it. If it is a ganglion cyst, the light will likely shine through it. Your health care provider may order an X-ray, ultrasound, or MRI to rule out other conditions. How is this treated? Ganglion cysts often go away on their own without treatment. If you have pain or other symptoms, treatment may be needed. Treatment is also needed if the ganglion cyst limits your movement or if it gets infected. Treatment may include:  Wearing a brace or splint on your wrist or finger.  Taking anti-inflammatory medicine.  Having fluid drained from the lump with a needle (aspiration).  Getting a steroid injected into the joint.  Having  surgery to remove the ganglion cyst.  Placing a pad on your shoe or wearing shoes that will not rub against the cyst if it is on your foot. Follow these instructions at home:  Do not press on the ganglion cyst, poke it with a needle, or hit it.  Take over-the-counter and prescription medicines only as told by your health care provider.  If you have a brace or splint: ? Wear it as told by your health care provider. ? Remove it as told by your health care provider. Ask if you need to remove it when you take a shower or a bath.  Watch your ganglion cyst for any changes.  Keep all follow-up visits as told by your health care provider. This is important. Contact a health care provider if:  Your ganglion cyst becomes larger or more painful.  You have pus coming from the lump.  You have weakness or numbness in the affected area.  You have a fever or chills. Get help right away if:  You have a fever and have any of these in the cyst area: ? Increased redness. ? Red streaks. ? Swelling. Summary  A ganglion cyst is a non-cancerous, fluid-filled lump that occurs near a joint or tendon.  Ganglion cysts most often develop in the hand or wrist, but they can also develop in the shoulder, elbow, hip, knee, ankle, or foot.  Ganglion cysts often go away on their own without treatment.  This information is not intended to replace advice given to you by your health care provider. Make sure you discuss any questions you have with your health care provider. Document Released: 11/04/2000 Document Revised: 07/07/2017 Document Reviewed: 07/07/2017 Elsevier Interactive Patient Education  2019 [employer]  Diabetes Mellitus and Foot Care Foot care is an important part of your health, especially when you have diabetes. Diabetes may cause you to have problems because of poor blood flow (circulation) to your feet and legs, which can cause your skin to:  Become thinner and drier.  Break more  easily.  Heal more slowly.  Peel and crack. You may also have nerve damage (neuropathy) in your legs and feet, causing decreased feeling in them. This means that you may not notice minor injuries to your feet that could lead to more serious problems. Noticing and addressing any potential problems early is the best way to prevent future foot problems. How to care for your feet Foot hygiene  Wash your feet daily with warm water and mild soap. Do not use hot water. Then, pat your feet and the areas between your toes until they are completely dry. Do not soak your feet as this can dry your skin.  Trim your toenails straight across. Do not dig under them or around the cuticle. File the edges of your nails with an emery board or nail file.  Apply a moisturizing lotion or petroleum jelly to the skin on your feet and to dry, brittle toenails. Use lotion that does not contain alcohol and is unscented. Do not apply lotion between your toes. Shoes and socks  Wear clean socks or stockings every day. Make sure they are not too tight. Do not wear knee-high stockings since they may decrease blood flow to your legs.  Wear shoes that fit properly and have enough cushioning. Always look in your shoes before you put them on to be sure there are no objects inside.  To break in new shoes, wear them for just a few hours a day. This prevents injuries on your feet. Wounds, scrapes, corns, and calluses  Check your feet daily for blisters, cuts, bruises, sores, and redness. If you cannot see the bottom of your feet, use a mirror or ask someone for help.  Do not cut corns or calluses or try to remove them with medicine.  If you find a minor scrape, cut, or break in the skin on your feet, keep it and the skin around it clean and dry. You may clean these areas with mild soap and water. Do not clean the area with peroxide, alcohol, or iodine.  If you have a wound, scrape, corn, or callus on your foot, look at it  several times a day to make sure it is healing and not infected. Check for: ? Redness, swelling, or pain. ? Fluid or blood. ? Warmth. ? Pus or a bad smell. General instructions  Do not cross your legs. This may decrease blood flow to your feet.  Do not use heating pads or hot water bottles on your feet. They may burn your skin. If you have lost feeling in your feet or legs, you may not know this is happening until it is too late.  Protect your feet from hot and cold by wearing shoes, such as at the beach or on hot pavement.  Schedule a complete foot exam at least once a year (annually) or more often if you have foot problems. If you have foot problems, report  any cuts, sores, or bruises to your health care provider immediately. Contact a health care provider if:  You have a medical condition that increases your risk of infection and you have any cuts, sores, or bruises on your feet.  You have an injury that is not healing.  You have redness on your legs or feet.  You feel burning or tingling in your legs or feet.  You have pain or cramps in your legs and feet.  Your legs or feet are numb.  Your feet always feel cold.  You have pain around a toenail. Get help right away if:  You have a wound, scrape, corn, or callus on your foot and: ? You have pain, swelling, or redness that gets worse. ? You have fluid or blood coming from the wound, scrape, corn, or callus. ? Your wound, scrape, corn, or callus feels warm to the touch. ? You have pus or a bad smell coming from the wound, scrape, corn, or callus. ? You have a fever. ? You have a red line going up your leg. Summary  Check your feet every day for cuts, sores, red spots, swelling, and blisters.  Moisturize feet and legs daily.  Wear shoes that fit properly and have enough cushioning.  If you have foot problems, report any cuts, sores, or bruises to your health care provider immediately.  Schedule a complete foot exam  at least once a year (annually) or more often if you have foot problems. This information is not intended to replace advice given to you by your health care provider. Make sure you discuss any questions you have with your health care provider. Document Released: 11/04/2000 Document Revised: 12/20/2017 Document Reviewed: 12/09/2016 Elsevier Interactive Patient Education  2019 [employer]

## 2019-04-01 NOTE — Progress Notes (Signed)
Subjective: George Boyer presents today for preventative diabetic foot care with painful, thick toenails 1-5 b/l that he cannot cut and which interfere with daily activities.  Pain is aggravated when wearing enclosed shoe gear and relieved with periodic professional debridement.  Patient notes new concern of lump noted on the dorsum of his right foot.  States it appeared about 2-3 weeks ago. Onset was sudden. He notes it may be related to the new pair of shoes he received about that same time.  He notes no pain, no redness, no warmth. Only aggravated when pressure is applied. It is not limiting his activities.  George Spray, MD is his PCP. Last visit December 17, 2018.   Current Outpatient Medications:  .  ACCU-CHEK AVIVA PLUS test strip, USE TO TEST BLOOD SUGARS FOUR TIMES DAILY DX E11.9 AND Z79.4, Disp: , Rfl: 1 .  Accu-Chek FastClix Lancets MISC, Use to monitor blood glucose 4 time(s) daily DX CODE E11.9 and Z79.4, Disp: , Rfl:  .  amiodarone (PACERONE) 200 MG tablet, Take 200 mg by mouth daily., Disp: , Rfl:  .  amLODipine (NORVASC) 5 MG tablet, TK 1 T PO QAM, Disp: , Rfl: 0 .  apixaban (ELIQUIS) 5 MG TABS tablet, Take 5 mg by mouth 2 (two) times daily., Disp: , Rfl:  .  atorvastatin (LIPITOR) 40 MG tablet, Take 40 mg by mouth daily., Disp: , Rfl:  .  benzonatate (TESSALON) 100 MG capsule, TK ONE C PO TID FOR 10 DAYS, Disp: , Rfl: 0 .  buPROPion (WELLBUTRIN XL) 150 MG 24 hr tablet, TAKE 1 TABLET BY MOUTH EVERY DAY IN THE MORNING, Disp: , Rfl: 0 .  cholecalciferol (VITAMIN D) 1000 UNITS tablet, Take 2,000 Units by mouth daily., Disp: , Rfl:  .  cyanocobalamin (,VITAMIN B-12,) 1000 MCG/ML injection, INJECT 1 ML IN THE MUSCLE EVERY 30 DAYS, Disp: , Rfl:  .  cyclobenzaprine (FLEXERIL) 10 MG tablet, Take 1 tablet (10 mg total) by mouth 2 (two) times daily as needed for muscle spasms., Disp: 20 tablet, Rfl: 0 .  fluticasone (FLOVENT HFA) 220 MCG/ACT inhaler, Inhale into the lungs., Disp: , Rfl:   .  furosemide (LASIX) 40 MG tablet, Take 40 mg by mouth daily., Disp: , Rfl:  .  HYDROcodone-acetaminophen (NORCO/VICODIN) 5-325 MG per tablet, Take 1-2 tablets by mouth every 6 (six) hours as needed for pain., Disp: 30 tablet, Rfl: 0 .  hydrocortisone 2.5 % ointment, APP TOPICALLY AA BID FOR 7 DAYS, Disp: , Rfl: 0 .  insulin NPH (HUMULIN N,NOVOLIN N) 100 UNIT/ML injection, Inject 40 Units into the skin 2 (two) times daily. 30 units in the morning.  40-50 units in the evening., Disp: , Rfl:  .  insulin regular (HUMULIN R) 100 units/mL injection, Inject 20 Units into the skin 3 (three) times daily before meals. 20 unit morning, 40 units lunch, and 30 units in the evening., Disp: , Rfl:  .  Insulin Syringe-Needle U-100 (INSULIN SYRINGE 1CC/31GX5/16") 31G X 5/16" 1 ML MISC, Use with insulin five (5) times daily  DX E11.42 and Z79.4, Disp: , Rfl:  .  irbesartan (AVAPRO) 150 MG tablet, Take 150 mg by mouth daily., Disp: , Rfl:  .  nitroGLYCERIN (NITROSTAT) 0.4 MG SL tablet, Place under the tongue., Disp: , Rfl:  .  pravastatin (PRAVACHOL) 20 MG tablet, TK 1 T PO HS, Disp: , Rfl: 0 .  predniSONE (DELTASONE) 20 MG tablet, Take 2 tablets (40 mg total) by mouth daily., Disp: 10 tablet,  Rfl: 0 .  ranolazine (RANEXA) 500 MG 12 hr tablet, TK 1 T PO BID, Disp: , Rfl: 11 .  Vitamin D, Ergocalciferol, (DRISDOL) 50000 units CAPS capsule, TK ONE C PO TWICE WEEKLY, Disp: , Rfl: 0  Allergies  Allergen Reactions  . Bupropion Other (See Comments)    Tremors  . Glucophage [Metformin Hcl] Diarrhea    Objective:  Vascular Examination: Capillary refill time <3 seconds x 10 digits.  Dorsalis pedis 2/4 b/l.  Posterior tibial pulses faintly palpable b/l.  Digital hair absent x 10 digits.  Skin temperature gradient WNL b/l.  Dermatological Examination: Skin with normal turgor, texture and tone b/l.  Toenails 1-5 b/l discolored, thick, dystrophic with subungual debris and pain with palpation to nailbeds due  to thickness of nails.  Hyperkeratotic lesions submet head 1, 5 b/l, distal tip left 2nd, 3rd digits, distal tip right 2nd digit, medial hallux IPJ b/l.Marland Kitchen. No erythema, no edema, no drainage, no flocculence noted with any of these lesions.  Musculoskeletal: Muscle strength 5/5 to all LE muscle groups.       Palpable firm, fixed nodule noted over dorsal forefoot 2nd webspace, along extensor tendon. No erythema, no edema, no drainage, no flocculence, no warmth.  No suspicion of abscess.  No gross bony deformities b/l.  No pain, crepitus or joint limitation noted with ROM.   Neurological: Sensation intact 5/5 with 10 gram monofilament.  Vibratory sensation diminished.  Xrays right foot reveal calcified vessels, early posterior calcaneal spur formation.  No gas in tissues.  Assessment: 1. Painful onychomycosis toenails 1-5 b/l  2. Ganglion cyst right foot  Plan: 1. Discussed ganglion cyst's etiology and course, conservative vs surgical treatment options available. Xray right foot taken/reviewed with George Boyer. Offered in-office aspiration of ganglion cyst, but patient refused on today stating he will wait until it becomes symptomatic. He has also had vascular studies done in the remote past, so any surgical intervention would depend on his peripheral vascular integrity. 2. Toenails 1-5 b/l were debrided in length and girth without iatrogenic bleeding. 3. Patient to continue soft, supportive shoe gear daily. 4. Patient to report any pedal injuries to medical professional immediately. 5. Follow up 9 weeks for diabetic foot care; he will call office if ganglion cyst of right foot becomes symptomatic.  6. Patient/POA to call should there be a concern in the interim.

## 2019-04-02 ENCOUNTER — Encounter: Payer: Self-pay | Admitting: Podiatry

## 2019-06-12 ENCOUNTER — Encounter: Payer: Self-pay | Admitting: Podiatry

## 2019-06-12 ENCOUNTER — Other Ambulatory Visit: Payer: Self-pay

## 2019-06-12 ENCOUNTER — Ambulatory Visit: Payer: Medicare Other | Admitting: Podiatry

## 2019-06-12 DIAGNOSIS — B351 Tinea unguium: Secondary | ICD-10-CM | POA: Diagnosis not present

## 2019-06-12 DIAGNOSIS — M79674 Pain in right toe(s): Secondary | ICD-10-CM | POA: Diagnosis not present

## 2019-06-12 DIAGNOSIS — M79675 Pain in left toe(s): Secondary | ICD-10-CM

## 2019-06-12 DIAGNOSIS — E1142 Type 2 diabetes mellitus with diabetic polyneuropathy: Secondary | ICD-10-CM | POA: Diagnosis not present

## 2019-06-12 DIAGNOSIS — L84 Corns and callosities: Secondary | ICD-10-CM

## 2019-06-12 NOTE — Patient Instructions (Signed)

## 2019-06-16 NOTE — Progress Notes (Signed)
Subjective: George Boyer presents today with painful, thick toenails 1-5 b/l that he cannot cut and which interfere with daily activities.  Pain is aggravated when wearing enclosed shoe gear.  Patient states the ganglion cyst on the dorsal 2nd webspace right foot has shrunk and is no longer visible.  Teodoro Sprayrujillo, Jamie, MD is his PCP.    Current Outpatient Medications:  .  ACCU-CHEK AVIVA PLUS test strip, USE TO TEST BLOOD SUGARS FOUR TIMES DAILY DX E11.9 AND Z79.4, Disp: , Rfl: 1 .  Accu-Chek FastClix Lancets MISC, Use to monitor blood glucose 4 time(s) daily DX CODE E11.9 and Z79.4, Disp: , Rfl:  .  amiodarone (PACERONE) 200 MG tablet, Take 200 mg by mouth daily., Disp: , Rfl:  .  amLODipine (NORVASC) 5 MG tablet, TK 1 T PO QAM, Disp: , Rfl: 0 .  apixaban (ELIQUIS) 5 MG TABS tablet, Take 5 mg by mouth 2 (two) times daily., Disp: , Rfl:  .  atorvastatin (LIPITOR) 40 MG tablet, Take 40 mg by mouth daily., Disp: , Rfl:  .  benzonatate (TESSALON) 100 MG capsule, TK ONE C PO TID FOR 10 DAYS, Disp: , Rfl: 0 .  buPROPion (WELLBUTRIN XL) 150 MG 24 hr tablet, TAKE 1 TABLET BY MOUTH EVERY DAY IN THE MORNING, Disp: , Rfl: 0 .  cholecalciferol (VITAMIN D) 1000 UNITS tablet, Take 2,000 Units by mouth daily., Disp: , Rfl:  .  cyanocobalamin (,VITAMIN B-12,) 1000 MCG/ML injection, INJECT 1 ML IN THE MUSCLE EVERY 30 DAYS, Disp: , Rfl:  .  cyclobenzaprine (FLEXERIL) 10 MG tablet, Take 1 tablet (10 mg total) by mouth 2 (two) times daily as needed for muscle spasms., Disp: 20 tablet, Rfl: 0 .  fluticasone (FLOVENT HFA) 220 MCG/ACT inhaler, Inhale into the lungs., Disp: , Rfl:  .  furosemide (LASIX) 40 MG tablet, Take 40 mg by mouth daily., Disp: , Rfl:  .  HYDROcodone-acetaminophen (NORCO/VICODIN) 5-325 MG per tablet, Take 1-2 tablets by mouth every 6 (six) hours as needed for pain., Disp: 30 tablet, Rfl: 0 .  hydrocortisone 2.5 % ointment, APP TOPICALLY AA BID FOR 7 DAYS, Disp: , Rfl: 0 .  insulin NPH (HUMULIN  N,NOVOLIN N) 100 UNIT/ML injection, Inject 40 Units into the skin 2 (two) times daily. 30 units in the morning.  40-50 units in the evening., Disp: , Rfl:  .  insulin regular (HUMULIN R) 100 units/mL injection, Inject 20 Units into the skin 3 (three) times daily before meals. 20 unit morning, 40 units lunch, and 30 units in the evening., Disp: , Rfl:  .  Insulin Syringe-Needle U-100 (INSULIN SYRINGE 1CC/31GX5/16") 31G X 5/16" 1 ML MISC, Use with insulin five (5) times daily  DX E11.42 and Z79.4, Disp: , Rfl:  .  irbesartan (AVAPRO) 150 MG tablet, Take 150 mg by mouth daily., Disp: , Rfl:  .  nitroGLYCERIN (NITROSTAT) 0.4 MG SL tablet, Place under the tongue., Disp: , Rfl:  .  pravastatin (PRAVACHOL) 20 MG tablet, TK 1 T PO HS, Disp: , Rfl: 0 .  predniSONE (DELTASONE) 20 MG tablet, Take 2 tablets (40 mg total) by mouth daily., Disp: 10 tablet, Rfl: 0 .  ranolazine (RANEXA) 500 MG 12 hr tablet, TK 1 T PO BID, Disp: , Rfl: 11 .  Vitamin D, Ergocalciferol, (DRISDOL) 50000 units CAPS capsule, TK ONE C PO TWICE WEEKLY, Disp: , Rfl: 0  Allergies  Allergen Reactions  . Bupropion Other (See Comments)    Tremors  . Glucophage [Metformin Hcl]  Diarrhea    Objective: Vascular Examination: Capillary refill time < 3 seconds x 10 digits.  Dorsalis pedis and Posterior tibial pulses palpable b/l.  Digital hair absent x 10 digits.  Skin temperature gradient WNL b/l.  Dermatological Examination: Skin with normal turgor, texture and tone b/l.  Toenails 1-5 b/l discolored, thick, dystrophic with subungual debris and pain with palpation to nailbeds due to thickness of nails.  Hyperkeratotic lesion submet head 1, 5 b/l, distal tip left 2nd and 3rd digits, distal tip right 2nd digit, medial hallux IP b/l with tenderness to palpation. No edema, no erythema, no drainage, no flocculence.  Musculoskeletal: Muscle strength 5/5 to all LE muscle groups.  No gross bony deformities b/l.  No pain, crepitus or  joint limitation noted with ROM.   Neurological: Sensation intact 5/5 b/l with 10 gram monofilament.  Vibratory sensation diminished b/l.  Assessment: Painful onychomycosis toenails 1-5 b/l  Corns and callosities submet head 1, 5 b/l, distal tip left 2nd and 3rd digits, distal tip right 2nd digit, medial hallux IP b/l   Plan: 1. Toenails 1-5 b/l were debrided in length and girth without iatrogenic bleeding. 2. Corns and calluses pared submet head 1, 5 b/l, distal tip left 2nd and 3rd digits, distal tip right 2nd digit, medial hallux IPJ b/lutilizing sterile scalpel blade without incident. 3. Continue to watch ganglion cyst right 2nd webspace should it become symptomatic.  4. Patient to continue soft, supportive shoe gear daily. 5. Patient to report any pedal injuries to medical professional immediately. 6. Follow up 9 weeks. 7. Patient/POA to call should there be a concern in the interim.

## 2019-07-11 DIAGNOSIS — R809 Proteinuria, unspecified: Secondary | ICD-10-CM | POA: Insufficient documentation

## 2019-07-11 DIAGNOSIS — E1122 Type 2 diabetes mellitus with diabetic chronic kidney disease: Secondary | ICD-10-CM | POA: Insufficient documentation

## 2019-07-11 DIAGNOSIS — D631 Anemia in chronic kidney disease: Secondary | ICD-10-CM | POA: Insufficient documentation

## 2019-08-14 ENCOUNTER — Ambulatory Visit (INDEPENDENT_AMBULATORY_CARE_PROVIDER_SITE_OTHER): Payer: Medicare Other | Admitting: Podiatry

## 2019-08-14 ENCOUNTER — Other Ambulatory Visit: Payer: Self-pay

## 2019-08-14 ENCOUNTER — Encounter: Payer: Self-pay | Admitting: Podiatry

## 2019-08-14 DIAGNOSIS — L84 Corns and callosities: Secondary | ICD-10-CM

## 2019-08-14 DIAGNOSIS — M79674 Pain in right toe(s): Secondary | ICD-10-CM | POA: Diagnosis not present

## 2019-08-14 DIAGNOSIS — E119 Type 2 diabetes mellitus without complications: Secondary | ICD-10-CM

## 2019-08-14 DIAGNOSIS — Z794 Long term (current) use of insulin: Secondary | ICD-10-CM | POA: Diagnosis not present

## 2019-08-14 DIAGNOSIS — M79675 Pain in left toe(s): Secondary | ICD-10-CM

## 2019-08-14 DIAGNOSIS — E1142 Type 2 diabetes mellitus with diabetic polyneuropathy: Secondary | ICD-10-CM

## 2019-08-14 DIAGNOSIS — B351 Tinea unguium: Secondary | ICD-10-CM | POA: Diagnosis not present

## 2019-08-14 NOTE — Patient Instructions (Signed)
Diabetic Neuropathy Diabetic neuropathy refers to nerve damage that is caused by diabetes (diabetes mellitus). Over time, people with diabetes can develop nerve damage throughout the body. There are several types of diabetic neuropathy:  Peripheral neuropathy. This is the most common type of diabetic neuropathy. It causes damage to nerves that carry signals between the spinal cord and other parts of the body (peripheral nerves). This usually affects nerves in the feet and legs first, and may eventually affect the hands and arms. The damage affects the ability to sense touch or temperature.  Autonomic neuropathy. This type causes damage to nerves that control involuntary functions (autonomic nerves). These nerves carry signals that control: ? Heartbeat. ? Body temperature. ? Blood pressure. ? Urination. ? Digestion. ? Sweating. ? Sexual function. ? Response to changing blood sugar (glucose) levels.  Focal neuropathy. This type of nerve damage affects one area of the body, such as an arm, a leg, or the face. The injury may involve one nerve or a small group of nerves. Focal neuropathy can be painful and unpredictable, and occurs most often in older adults with diabetes. This often develops suddenly, but usually improves over time and does not cause long-term problems.  Proximal neuropathy. This type of nerve damage affects the nerves of the thighs, hips, buttocks, or legs. It causes severe pain, weakness, and muscle death (atrophy), usually in the thigh muscles. It is more common among older men and people who have type 2 diabetes. The length of recovery time may vary. What are the causes? Peripheral, autonomic, and focal neuropathies are caused by diabetes that is not well controlled with treatment. The cause of proximal neuropathy is not known, but it may be caused by inflammation related to uncontrolled blood glucose levels. What are the signs or symptoms? Peripheral neuropathy Peripheral  neuropathy develops slowly over time. When the nerves of the feet and legs no longer work, you may experience:  Burning, stabbing, or aching pain in the legs or feet.  Pain or cramping in the legs or feet.  Loss of feeling (numbness) and inability to feel pressure or pain in the feet. This can lead to: ? Thick calluses or sores on areas of constant pressure. ? Ulcers. ? Reduced ability to feel temperature changes.  Foot deformities.  Muscle weakness.  Loss of balance or coordination. Autonomic neuropathy The symptoms of autonomic neuropathy vary depending on which nerves are affected. Symptoms may include:  Problems with digestion, such as: ? Nausea or vomiting. ? Poor appetite. ? Bloating. ? Diarrhea or constipation. ? Trouble swallowing. ? Losing weight without trying to.  Problems with the heart, blood and lungs, such as: ? Dizziness, especially when standing up. ? Fainting. ? Shortness of breath. ? Irregular heartbeat.  Bladder problems, such as: ? Trouble starting or stopping urination. ? Leaking urine. ? Trouble emptying the bladder. ? Urinary tract infections (UTIs).  Problems with other body functions, such as: ? Sweat. You may sweat too much or too little. ? Temperature. You might get hot easily. Or, you might feel cold more than usual. ? Sexual function. Men may not be able to get or maintain an erection. Women may have vaginal dryness and difficulty with arousal. Focal neuropathy Symptoms affect only one area of the body. Common symptoms include:  Numbness.  Tingling.  Burning pain.  Prickling feeling.  Very sensitive skin.  Weakness.  Inability to move (paralysis).  Muscle twitching.  Muscles getting smaller (wasting).  Poor coordination.  Double or blurred vision. Proximal   neuropathy  Sudden, severe pain in the hip, thigh, or buttocks. Pain may spread from the back into the legs (sciatica).  Pain and numbness in the arms and legs.   Tingling.  Loss of bladder control or bowel control.  Weakness and wasting of thigh muscles.  Difficulty getting up from a seated position.  Abdominal swelling.  Unexplained weight loss. How is this diagnosed? Diagnosis usually involves reviewing your medical history and any symptoms you have. Diagnosis varies depending on the type of neuropathy your health care provider suspects. Peripheral neuropathy Your health care provider will check areas that are affected by your nervous system (neurologic exam), such as your reflexes, how you move, and what you can feel. You may have other tests, such as:  Blood tests.  Removal and examination of fluid that surrounds the spinal cord (lumbar puncture).  CT scan.  MRI.  A test to check the nerves that control muscles (electromyogram, EMG).  Tests of how quickly messages pass through your nerves (nerve conduction velocity tests).  Removal of a small piece of nerve to be examined under a microscope (biopsy). Autonomic neuropathy You may have tests, such as:  Tests to measure your blood pressure and heart rate. This may include monitoring you while you are safely secured to an exam table that moves you from a lying position to an upright position (table tilt test).  Breathing tests to check your lungs.  Tests to check how food moves through the digestive system (gastric emptying tests).  Blood, sweat, or urine tests.  Ultrasound of your bladder.  Spinal fluid tests. Focal neuropathy This condition may be diagnosed with:  A neurologic exam.  CT scan.  MRI.  EMG.  Nerve conduction velocity tests. Proximal neuropathy There is no test to diagnose this type of neuropathy. You may have tests to rule out other possible causes of this type of neuropathy. Tests may include:  X-rays of your spine and lumbar region.  Lumbar puncture.  MRI. How is this treated? The goal of treatment is to keep nerve damage from getting worse.  The most important part of treatment is keeping your blood glucose level and your A1C level within your target range by following your diabetes management plan. Over time, maintaining lower blood glucose levels helps lessen symptoms. In some cases, you may need prescription pain medicine. Follow these instructions at home:  Lifestyle   Do not use any products that contain nicotine or tobacco, such as cigarettes and e-cigarettes. If you need help quitting, ask your health care provider.  Be physically active every day. Include strength training and balance exercises.  Follow a healthy meal plan.  Work with your health care provider to manage your blood pressure. General instructions  Follow your diabetes management plan as directed. ? Check your blood glucose levels as directed by your health care provider. ? Keep your blood glucose in your target range as directed by your health care provider. ? Have your A1C level checked at least two times a year, or as often as told by your health care provider.  Take over the counter and prescription medicines only as told by your health care provider. This includes insulin and diabetes medicine.  Do not drive or use heavy machinery while taking prescription pain medicines.  Check your skin and feet every day for cuts, bruises, redness, blisters, or sores.  Keep all follow up visits as told by your health care provider. This is important. Contact a health care provider if:    You have burning, stabbing, or aching pain in your legs or feet.  You are unable to feel pressure or pain in your feet.  You develop problems with digestion, such as: ? Nausea. ? Vomiting. ? Bloating. ? Constipation. ? Diarrhea. ? Abdominal pain.  You have difficulty with urination, such as inability: ? To control when you urinate (incontinence). ? To completely empty the bladder (retention).  You have palpitations.  You feel dizzy, weak, or faint when you stand  up. Get help right away if:  You cannot urinate.  You have sudden weakness or loss of coordination.  You have trouble speaking.  You have pain or pressure in your chest.  You have an irregular heart beat.  You have sudden inability to move a part of your body. Summary  Diabetic neuropathy refers to nerve damage that is caused by diabetes. It can affect nerves throughout the entire body, causing numbness and pain in the arms, legs, digestive tract, heart, and other body systems.  Keep your blood glucose level and your blood pressure in your target range, as directed by your health care provider. This can help prevent neuropathy from getting worse.  Check your skin and feet every day for cuts, bruises, redness, blisters, or sores.  Do not use any products that contain nicotine or tobacco, such as cigarettes and e-cigarettes. If you need help quitting, ask your health care provider. This information is not intended to replace advice given to you by your health care provider. Make sure you discuss any questions you have with your health care provider. Document Released: 01/16/2002 Document Revised: 12/20/2017 Document Reviewed: 12/12/2016 Elsevier Patient Education  2020 [employer]  Onychomycosis/Fungal Toenails  WHAT IS IT? An infection that lies within the keratin of your nail plate that is caused by a fungus.  WHY ME? Fungal infections affect all ages, sexes, races, and creeds.  There may be many factors that predispose you to a fungal infection such as age, coexisting medical conditions such as diabetes, or an autoimmune disease; stress, medications, fatigue, genetics, etc.  Bottom line: fungus thrives in a warm, moist environment and your shoes offer such a location.  IS IT CONTAGIOUS? Theoretically, yes.  You do not want to share shoes, nail clippers or files with someone who has fungal toenails.  Walking around barefoot in the same room or sleeping in the same bed is unlikely  to transfer the organism.  It is important to realize, however, that fungus can spread easily from one nail to the next on the same foot.  HOW DO WE TREAT THIS?  There are several ways to treat this condition.  Treatment may depend on many factors such as age, medications, pregnancy, liver and kidney conditions, etc.  It is best to ask your doctor which options are available to you.  1. No treatment.   Unlike many other medical concerns, you can live with this condition.  However for many people this can be a painful condition and may lead to ingrown toenails or a bacterial infection.  It is recommended that you keep the nails cut short to help reduce the amount of fungal nail. 2. Topical treatment.  These range from herbal remedies to prescription strength nail lacquers.  About 40-50% effective, topicals require twice daily application for approximately 9 to 12 months or until an entirely new nail has grown out.  The most effective topicals are medical grade medications available through physicians offices. 3. Oral antifungal medications.  With an 80-90% cure   rate, the most common oral medication requires 3 to 4 months of therapy and stays in your system for a year as the new nail grows out.  Oral antifungal medications do require blood work to make sure it is a safe drug for you.  A liver function panel will be performed prior to starting the medication and after the first month of treatment.  It is important to have the blood work performed to avoid any harmful side effects.  In general, this medication safe but blood work is required. 4. Laser Therapy.  This treatment is performed by applying a specialized laser to the affected nail plate.  This therapy is noninvasive, fast, and non-painful.  It is not covered by insurance and is therefore, out of pocket.  The results have been very good with a 80-95% cure rate.  [facility] is the only practice in the area to offer this therapy. 5. Permanent  Nail Avulsion.  Removing the entire nail so that a new nail will not grow back. 

## 2019-08-20 NOTE — Progress Notes (Signed)
Subjective: George Boyer is a 68 y.o. y.o. male who presents fpr preventative diabetic foot care today with h/o calluses b/l and  cc of painful, discolored, thick toenails and painful callus/corn which interfere with daily activities. Pain is aggravated when wearing enclosed shoe gear and relieved with periodic professional debridement.  Sarina Ser, MD is his PCP.   Current Outpatient Medications on File Prior to Visit  Medication Sig Dispense Refill  . Insulin Glargine, 1 Unit Dial, 300 UNIT/ML SOPN Inject 60 units under the skin once daily.    Marland Kitchen ACCU-CHEK AVIVA PLUS test strip USE TO TEST BLOOD SUGARS FOUR TIMES DAILY DX E11.9 AND Z79.4  1  . Accu-Chek FastClix Lancets MISC Use to monitor blood glucose 4 time(s) daily DX CODE E11.9 and Z79.4    . amiodarone (PACERONE) 200 MG tablet Take 200 mg by mouth daily.    Marland Kitchen amLODipine (NORVASC) 5 MG tablet TK 1 T PO QAM  0  . apixaban (ELIQUIS) 5 MG TABS tablet Take 5 mg by mouth 2 (two) times daily.    Marland Kitchen atorvastatin (LIPITOR) 40 MG tablet Take 40 mg by mouth daily.    . benzonatate (TESSALON) 100 MG capsule TK ONE C PO TID FOR 10 DAYS  0  . buPROPion (WELLBUTRIN XL) 150 MG 24 hr tablet TAKE 1 TABLET BY MOUTH EVERY DAY IN THE MORNING  0  . cholecalciferol (VITAMIN D) 1000 UNITS tablet Take 2,000 Units by mouth daily.    . cyanocobalamin (,VITAMIN B-12,) 1000 MCG/ML injection INJECT 1 ML IN THE MUSCLE EVERY 30 DAYS    . cyclobenzaprine (FLEXERIL) 10 MG tablet Take 1 tablet (10 mg total) by mouth 2 (two) times daily as needed for muscle spasms. 20 tablet 0  . fluticasone (FLOVENT HFA) 220 MCG/ACT inhaler Inhale into the lungs.    . furosemide (LASIX) 40 MG tablet Take 40 mg by mouth daily.    Marland Kitchen HYDROcodone-acetaminophen (NORCO/VICODIN) 5-325 MG per tablet Take 1-2 tablets by mouth every 6 (six) hours as needed for pain. 30 tablet 0  . hydrocortisone 2.5 % ointment APP TOPICALLY AA BID FOR 7 DAYS  0  . insulin NPH (HUMULIN N,NOVOLIN N) 100 UNIT/ML  injection Inject 40 Units into the skin 2 (two) times daily. 30 units in the morning.  40-50 units in the evening.    . insulin regular (HUMULIN R) 100 units/mL injection Inject 20 Units into the skin 3 (three) times daily before meals. 20 unit morning, 40 units lunch, and 30 units in the evening.    . Insulin Syringe-Needle U-100 (INSULIN SYRINGE 1CC/31GX5/16") 31G X 5/16" 1 ML MISC Use with insulin five (5) times daily  DX E11.42 and Z79.4    . irbesartan (AVAPRO) 150 MG tablet Take 150 mg by mouth daily.    . nitroGLYCERIN (NITROSTAT) 0.4 MG SL tablet Place under the tongue.    . pravastatin (PRAVACHOL) 20 MG tablet TK 1 T PO HS  0  . predniSONE (DELTASONE) 20 MG tablet Take 2 tablets (40 mg total) by mouth daily. 10 tablet 0  . ranolazine (RANEXA) 500 MG 12 hr tablet TK 1 T PO BID  11  . TOUJEO SOLOSTAR 300 UNIT/ML SOPN ADM 60 UNI [place] QD    . TRADJENTA 5 MG TABS tablet     . Vitamin D, Ergocalciferol, (DRISDOL) 50000 units CAPS capsule TK ONE C PO TWICE WEEKLY  0   No current facility-administered medications on file prior to visit.  Allergies  Allergen Reactions  . Sunflower Oil Anaphylaxis    Swelling of throat  . Bupropion Other (See Comments)    Tremors  . Glucophage [Metformin Hcl] Diarrhea    Objective:  Vascular Examination: Capillary refill time < 3 seconds x 10 digits.  Dorsalis pedis pulses palpable b/l.  Posterior tibial pulses palpable b/l.  Digital hair absent x 10 digits.  Skin temperature gradient WNL b/l.  Dermatological Examination: Skin with normal turgor, texture and tone b/l.  Toenails 1-5 b/l discolored, thick, dystrophic with subungual debris and pain with palpation to nailbeds due to thickness of nails.  Hyperkeratotic lesions submet heads 1, 5 b/l and b/l hallux. No erythema, no edema, no drainage, no flocculence noted.   Musculoskeletal: Muscle strength 5/5 to all LE muscle groups b/l.   No gross bony deformities b/l.   No pain, crepitus  or joint limitation noted with active/passive ROM b/l.  Neurological: Sensation intact 5/5 b/l with 10 gram monofilament.  Vibratory sensation diminished b/l.  Assessment: 1. Painful onychomycosis toenails 1-5 b/l 2.  Calluses submet heads 1, 5 b/l and b/l hallux 3.  NIDDM  Plan: 1. Continue diabetic foot care principles. Literature dispensed on today. 2. Toenails 1-5 b/l were debrided in length and girth without iatrogenic bleeding. 3. Hyperkeratotic lesion(s) pared submet heads 1, 5 b/l and b/l hallux with sterile scalpel blade without incident. 4. Patient to continue soft, supportive shoe gear daily. 5. Patient to report any pedal injuries to medical professional immediately. 6. Follow up 9 weeks.  7. Patient/POA to call should there be a concern in the interim.

## 2019-11-05 ENCOUNTER — Ambulatory Visit: Payer: Medicare Other | Admitting: Podiatry

## 2019-11-05 ENCOUNTER — Other Ambulatory Visit: Payer: Self-pay

## 2019-11-05 ENCOUNTER — Encounter: Payer: Self-pay | Admitting: Podiatry

## 2019-11-05 DIAGNOSIS — M79675 Pain in left toe(s): Secondary | ICD-10-CM

## 2019-11-05 DIAGNOSIS — Z794 Long term (current) use of insulin: Secondary | ICD-10-CM | POA: Diagnosis not present

## 2019-11-05 DIAGNOSIS — B351 Tinea unguium: Secondary | ICD-10-CM | POA: Diagnosis not present

## 2019-11-05 DIAGNOSIS — L84 Corns and callosities: Secondary | ICD-10-CM

## 2019-11-05 DIAGNOSIS — E119 Type 2 diabetes mellitus without complications: Secondary | ICD-10-CM | POA: Diagnosis not present

## 2019-11-05 DIAGNOSIS — M79674 Pain in right toe(s): Secondary | ICD-10-CM

## 2019-11-05 NOTE — Patient Instructions (Signed)

## 2019-11-13 NOTE — Progress Notes (Signed)
Subjective: George Boyer is a 68 y.o. y.o. male who presents today with cc of painful, discolored, thick toenails and painful callus/corn which interfere with daily activities. Pain is aggravated when wearing enclosed shoe gear and relieved with periodic professional debridement.  Sarina Ser, MD is his PCP.   He remains on blood thinner, Eliquis.  Medications reviewed in chart.  Allergies  Allergen Reactions  . Sunflower Oil Anaphylaxis    Swelling of throat  . Bupropion Other (See Comments)    Tremors  . Glucophage [Metformin Hcl] Diarrhea    Objective: There were no vitals filed for this visit.  Vascular Examination: Capillary refill time to digits <3 seconds b/l.  Dorsalis pedis and posterior tibial pulses palpable b/l.  Digital hair absent b/l.  Skin temperature gradient WNL b/l.  Dermatological Examination: Skin with normal turgor, texture and tone b/l.  Toenails 1-5 b/l discolored, thick, dystrophic with subungual debris and pain with palpation to nailbeds due to thickness of nails.  Hyperkeratotic lesions submet heads 1, 5 b/l and b/l hallux. No erythema, no edema, no drainage, no flocculence noted.   Musculoskeletal: Muscle strength 5/5 to all LE muscle groups b/l.  No gross bony deformities b/l.  Normal LE joint ROM without pain or crepitus  Neurological: Sensation intact 5/5 b/l with 10 gram monofilament.  Vibratory sensation diminished b/l.  Assessment: 1. Painful onychomycosis toenails 1-5 b/l 2.   Calluses submet head 1, 5 b/l and b/l hallux 3.   NIDDM  Plan: 1. No new findings. No new orders. 2. Continue diabetic foot care principles. Literature dispensed on today. 3. Toenails 1-5 b/l were debrided in length and girth without iatrogenic bleeding. 4. Hyperkeratotic lesion(s) submet head 1, 5 b/l and b/l hallux pared with sterile scalpel blade without incident. 5. Patient to continue soft, supportive shoe gear daily. 6. Patient to report  any pedal injuries to medical professional immediately. 7. Follow up 9 weeks.  8. Patient/POA to call should there be a concern in the interim.

## 2019-12-10 ENCOUNTER — Ambulatory Visit: Payer: Medicare Other | Attending: Internal Medicine

## 2019-12-10 DIAGNOSIS — Z23 Encounter for immunization: Secondary | ICD-10-CM | POA: Insufficient documentation

## 2019-12-10 NOTE — Progress Notes (Signed)
   Covid-[facility] Vaccination [facility]  Name:  George Boyer    MRN: [id number] DOB: 30-Dec-1950  12/10/2019  [name] was observed post Covid-19 immunization for 15 minutes without incidence. He was provided with Vaccine Information Sheet and instruction to access the V-Safe system.   [name] was instructed to call 911 with any severe reactions post vaccine: Marland Kitchen Difficulty breathing  . Swelling of your face and throat  . A fast heartbeat  . A bad rash all over your body  . Dizziness and weakness    Immunizations Administered    Name Date Dose VIS Date Route   Pfizer COVID-19 Vaccine 12/10/2019 12:40 PM 0.3 mL 11/01/2019 Intramuscular   Manufacturer: [employer], [employer]   Lot: [id number]   NDC: [address]-2

## 2019-12-31 ENCOUNTER — Ambulatory Visit: Payer: Medicare Other | Attending: Internal Medicine

## 2019-12-31 DIAGNOSIS — Z23 Encounter for immunization: Secondary | ICD-10-CM | POA: Insufficient documentation

## 2019-12-31 NOTE — Progress Notes (Signed)
   Covid-19 Vaccination [facility]  Name:  George Boyer    MRN: [id number] DOB: 03/27/1951  12/31/2019  George Boyer was observed post Covid-19 immunization for 15 minutes without incidence. He was provided with Vaccine Information Sheet and instruction to access the V-Safe system.   George Boyer was instructed to call 911 with any severe reactions post vaccine: Marland Kitchen Difficulty breathing  . Swelling of your face and throat  . A fast heartbeat  . A bad rash all over your body  . Dizziness and weakness    Immunizations Administered    Name Date Dose VIS Date Route   Pfizer COVID-19 Vaccine 12/31/2019  1:06 PM 0.3 mL 11/01/2019 Intramuscular   Manufacturer: [employer], [employer]   Lot: [id number]   NDC: [address]-1

## 2020-02-04 ENCOUNTER — Ambulatory Visit: Payer: Medicare Other | Admitting: Podiatry

## 2020-02-04 ENCOUNTER — Encounter: Payer: Self-pay | Admitting: Podiatry

## 2020-02-04 ENCOUNTER — Other Ambulatory Visit: Payer: Self-pay

## 2020-02-04 VITALS — Temp 97.2°F

## 2020-02-04 DIAGNOSIS — Z794 Long term (current) use of insulin: Secondary | ICD-10-CM

## 2020-02-04 DIAGNOSIS — M79675 Pain in left toe(s): Secondary | ICD-10-CM | POA: Diagnosis not present

## 2020-02-04 DIAGNOSIS — B351 Tinea unguium: Secondary | ICD-10-CM

## 2020-02-04 DIAGNOSIS — L84 Corns and callosities: Secondary | ICD-10-CM | POA: Diagnosis not present

## 2020-02-04 DIAGNOSIS — E119 Type 2 diabetes mellitus without complications: Secondary | ICD-10-CM

## 2020-02-04 DIAGNOSIS — M79674 Pain in right toe(s): Secondary | ICD-10-CM | POA: Diagnosis not present

## 2020-02-04 NOTE — Progress Notes (Signed)
Subjective: George Boyer presents today for follow up of preventative diabetic foot care and callus(es) b/l feet and painful mycotic toenails b/l that are difficult to trim. Pain interferes with ambulation. Aggravating factors include wearing enclosed shoe gear. Pain is relieved with periodic professional debridement.   He states he did attempt trim his nails.  Allergies  Allergen Reactions  . Sunflower Oil Anaphylaxis    Swelling of throat  . Bupropion Other (See Comments)    Tremors  . Glucophage [Metformin Hcl] Diarrhea     Objective: Vitals:   02/04/20 0833  Temp: (!) 97.2 F (36.2 C)    Pt 69 y.o. year old Caucasian male, obese,  in NAD. AAO x 3.   Vascular Examination:  Capillary fill time to digits <3 seconds b/l. Palpable DP pulses b/l. Palpable PT pulses b/l. Pedal hair absent b/l Skin temperature gradient within normal limits b/l.  Dermatological Examination: Pedal skin with normal turgor, texture and tone bilaterally. No open wounds bilaterally. No interdigital macerations bilaterally. Toenails 1-5 b/l elongated, dystrophic, thickened, crumbly with subungual debris and tenderness to dorsal palpation. Hyperkeratotic lesion(s) submet head 1 b/l and b/l hallux IPJ.  No erythema, no edema, no drainage, no flocculence.   Incurvated nailplate left great toe medial border(s) with tenderness to palpation. No erythema, no edema, no drainage noted.  Musculoskeletal: Normal muscle strength 5/5 to all lower extremity muscle groups bilaterally, no pain crepitus or joint limitation noted with ROM b/l and bunion deformity noted b/l  Neurological: Protective sensation intact 5/5 intact bilaterally with 10g monofilament b/l Vibratory sensation intact b/l  Assessment: 1. Pain due to onychomycosis of toenails of both feet   2. Callus   3. Type 2 diabetes mellitus without complication, with long-term current use of insulin (HCC)    Plan: -Continue diabetic foot care principles.  Literature dispensed on today.  -Toenails 1-5 b/l were debrided in length and girth with sterile nail nippers and dremel. -Offending nail border debrided and curretaged left hallux. Light bleeding of border addressed with Lumicain hemostatic solution. Border cleansed with alcohol and triple antibiotic applied. No further treatment required by patient. -Patient advised to apply moisturizing lotion to both feet once daily. -Patient to continue soft, supportive shoe gear daily. -Continue diabetic shoes daily. -Patient to report any pedal injuries to medical professional immediately. -Patient/POA to call should there be question/concern in the interim.  Return in about 3 months (around 05/06/2020) for diabetic nail and callus trim.

## 2020-02-04 NOTE — Patient Instructions (Signed)
Diabetes Mellitus and Foot Care Foot care is an important part of your health, especially when you have diabetes. Diabetes may cause you to have problems because of poor blood flow (circulation) to your feet and legs, which can cause your skin to:  Become thinner and drier.  Break more easily.  Heal more slowly.  Peel and crack. You may also have nerve damage (neuropathy) in your legs and feet, causing decreased feeling in them. This means that you may not notice minor injuries to your feet that could lead to more serious problems. Noticing and addressing any potential problems early is the best way to prevent future foot problems. How to care for your feet Foot hygiene  Wash your feet daily with warm water and mild soap. Do not use hot water. Then, pat your feet and the areas between your toes until they are completely dry. Do not soak your feet as this can dry your skin.  Trim your toenails straight across. Do not dig under them or around the cuticle. File the edges of your nails with an emery board or nail file.  Apply a moisturizing lotion or petroleum jelly to the skin on your feet and to dry, brittle toenails. Use lotion that does not contain alcohol and is unscented. Do not apply lotion between your toes. Shoes and socks  Wear clean socks or stockings every day. Make sure they are not too tight. Do not wear knee-high stockings since they may decrease blood flow to your legs.  Wear shoes that fit properly and have enough cushioning. Always look in your shoes before you put them on to be sure there are no objects inside.  To break in new shoes, wear them for just a few hours a day. This prevents injuries on your feet. Wounds, scrapes, corns, and calluses  Check your feet daily for blisters, cuts, bruises, sores, and redness. If you cannot see the bottom of your feet, use a mirror or ask someone for help.  Do not cut corns or calluses or try to remove them with medicine.  If you  find a minor scrape, cut, or break in the skin on your feet, keep it and the skin around it clean and dry. You may clean these areas with mild soap and water. Do not clean the area with peroxide, alcohol, or iodine.  If you have a wound, scrape, corn, or callus on your foot, look at it several times a day to make sure it is healing and not infected. Check for: ? Redness, swelling, or pain. ? Fluid or blood. ? Warmth. ? Pus or a bad smell. General instructions  Do not cross your legs. This may decrease blood flow to your feet.  Do not use heating pads or hot water bottles on your feet. They may burn your skin. If you have lost feeling in your feet or legs, you may not know this is happening until it is too late.  Protect your feet from hot and cold by wearing shoes, such as at the beach or on hot pavement.  Schedule a complete foot exam at least once a year (annually) or more often if you have foot problems. If you have foot problems, report any cuts, sores, or bruises to your health care provider immediately. Contact a health care provider if:  You have a medical condition that increases your risk of infection and you have any cuts, sores, or bruises on your feet.  You have an injury that is not   healing.  You have redness on your legs or feet.  You feel burning or tingling in your legs or feet.  You have pain or cramps in your legs and feet.  Your legs or feet are numb.  Your feet always feel cold.  You have pain around a toenail. Get help right away if:  You have a wound, scrape, corn, or callus on your foot and: ? You have pain, swelling, or redness that gets worse. ? You have fluid or blood coming from the wound, scrape, corn, or callus. ? Your wound, scrape, corn, or callus feels warm to the touch. ? You have pus or a bad smell coming from the wound, scrape, corn, or callus. ? You have a fever. ? You have a red line going up your leg. Summary  Check your feet every day  for cuts, sores, red spots, swelling, and blisters.  Moisturize feet and legs daily.  Wear shoes that fit properly and have enough cushioning.  If you have foot problems, report any cuts, sores, or bruises to your health care provider immediately.  Schedule a complete foot exam at least once a year (annually) or more often if you have foot problems. This information is not intended to replace advice given to you by your health care provider. Make sure you discuss any questions you have with your health care provider. Document Revised: 07/31/2019 Document Reviewed: 12/09/2016 Elsevier Patient Education  2020 [employer]  

## 2020-04-08 DIAGNOSIS — I5189 Other ill-defined heart diseases: Secondary | ICD-10-CM | POA: Insufficient documentation

## 2020-04-21 ENCOUNTER — Encounter: Payer: Self-pay | Admitting: Podiatry

## 2020-04-21 ENCOUNTER — Other Ambulatory Visit: Payer: Self-pay

## 2020-04-21 ENCOUNTER — Ambulatory Visit: Payer: Medicare Other | Admitting: Podiatry

## 2020-04-21 VITALS — Temp 97.5°F

## 2020-04-21 DIAGNOSIS — E119 Type 2 diabetes mellitus without complications: Secondary | ICD-10-CM | POA: Diagnosis not present

## 2020-04-21 DIAGNOSIS — M2011 Hallux valgus (acquired), right foot: Secondary | ICD-10-CM

## 2020-04-21 DIAGNOSIS — M79675 Pain in left toe(s): Secondary | ICD-10-CM | POA: Diagnosis not present

## 2020-04-21 DIAGNOSIS — L84 Corns and callosities: Secondary | ICD-10-CM | POA: Diagnosis not present

## 2020-04-21 DIAGNOSIS — M79674 Pain in right toe(s): Secondary | ICD-10-CM

## 2020-04-21 DIAGNOSIS — Z794 Long term (current) use of insulin: Secondary | ICD-10-CM | POA: Diagnosis not present

## 2020-04-21 DIAGNOSIS — B351 Tinea unguium: Secondary | ICD-10-CM

## 2020-04-21 DIAGNOSIS — M2012 Hallux valgus (acquired), left foot: Secondary | ICD-10-CM | POA: Diagnosis not present

## 2020-04-21 NOTE — Progress Notes (Signed)
Subjective: George Boyer presents today for for annual diabetic foot examination and painful callus(es) b/l feet and painful mycotic toenails b/l that are difficult to trim. Pain interferes with ambulation. Aggravating factors include wearing enclosed shoe gear. Pain is relieved with periodic professional debridement.   He voices no new pedal concerns on today's visit.  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  His wife is scheduled for knee surgery and he will take time off to care for her.  Past Medical History:  Diagnosis Date  . Asthma   . Atrial fibrillation (HCC)   . Coronary artery disease   . Diabetes mellitus without complication (HCC)   . Hypertension     Patient Active Problem List   Diagnosis Date Noted  . Anemia due to chronic kidney disease 07/11/2019  . CKD stage 3 due to type 2 diabetes mellitus (HCC) 07/11/2019  . Microalbuminuria 07/11/2019  . SOB (shortness of breath) on exertion 04/19/2017  . S/P coronary artery stent placement 05/04/2016  . Obstructive sleep apnea 09/29/2015  . High risk medication use 08/21/2014  . Body mass index 40.0-44.9, adult (HCC) 08/14/2013  . Anxiety and depression 08/15/2012  . Arthralgia 08/15/2012  . Bradycardia 08/15/2012  . CAD in native artery 08/15/2012  . Central obesity 08/15/2012  . Dependent edema 08/15/2012  . Dyslipidemia 08/15/2012  . Erectile dysfunction 08/15/2012  . HTN (hypertension) 08/15/2012  . T2DM (type 2 diabetes mellitus) (HCC) 08/15/2012  . Vitamin D deficiency 08/15/2012  . Hyperlipidemia LDL goal <100 08/15/2012  . Unspecified atrial fibrillation (HCC) 07/13/2012  . Long term (current) use of anticoagulants 07/13/2012    Past Surgical History:  Procedure Laterality Date  . CORONARY STENT PLACEMENT    . TONSILLECTOMY      Current Outpatient Medications on File Prior to Visit  Medication Sig Dispense Refill   . ACCU-CHEK AVIVA PLUS test strip USE TO TEST BLOOD SUGARS FOUR TIMES DAILY DX E11.9 AND Z79.4  1  . Accu-Chek FastClix Lancets MISC Use to monitor blood glucose 4 time(s) daily DX CODE E11.9 and Z79.4    . amiodarone (PACERONE) 200 MG tablet Take 200 mg by mouth daily.    Marland Kitchen amLODipine (NORVASC) 5 MG tablet TK 1 T PO QAM  0  . apixaban (ELIQUIS) 5 MG TABS tablet Take 5 mg by mouth 2 (two) times daily.    Marland Kitchen atorvastatin (LIPITOR) 40 MG tablet Take 40 mg by mouth daily.    . benzonatate (TESSALON) 100 MG capsule TK ONE C PO TID FOR 10 DAYS  0  . buPROPion (WELLBUTRIN XL) 150 MG 24 hr tablet TAKE 1 TABLET BY MOUTH EVERY DAY IN THE MORNING  0  . cholecalciferol (VITAMIN D) 1000 UNITS tablet Take 2,000 Units by mouth daily.    . cyanocobalamin (,VITAMIN B-12,) 1000 MCG/ML injection INJECT 1 ML IN THE MUSCLE EVERY 30 DAYS    . cyclobenzaprine (FLEXERIL) 10 MG tablet Take 1 tablet (10 mg total) by mouth 2 (two) times daily as needed for muscle spasms. 20 tablet 0  . fluticasone (FLOVENT HFA) 220 MCG/ACT inhaler Inhale into the lungs.    . Folic Acid-Cholecalciferol 03-276 MG-UNIT TABS Take by mouth.    . furosemide (LASIX) 40 MG tablet Take 40 mg by mouth daily.    Marland Kitchen HYDROcodone-acetaminophen (NORCO/VICODIN) 5-325 MG per tablet Take 1-2 tablets by mouth every 6 (six) hours as needed for pain.  30 tablet 0  . hydrocortisone 2.5 % ointment APP TOPICALLY AA BID FOR 7 DAYS  0  . Insulin Glargine, 1 Unit Dial, 300 UNIT/ML SOPN Inject 60 units under the skin once daily.    . insulin NPH (HUMULIN N,NOVOLIN N) 100 UNIT/ML injection Inject 40 Units into the skin 2 (two) times daily. 30 units in the morning.  40-50 units in the evening.    . insulin regular (HUMULIN R) 100 units/mL injection Inject 20 Units into the skin 3 (three) times daily before meals. 20 unit morning, 40 units lunch, and 30 units in the evening.    . Insulin Syringe-Needle U-100 (INSULIN SYRINGE 1CC/31GX5/16") 31G X 5/16" 1 ML MISC Use with  insulin five (5) times daily  DX E11.42 and Z79.4    . irbesartan (AVAPRO) 150 MG tablet Take 150 mg by mouth daily.    . nitroGLYCERIN (NITROSTAT) 0.4 MG SL tablet Place under the tongue.    . pravastatin (PRAVACHOL) 20 MG tablet TK 1 T PO HS  0  . predniSONE (DELTASONE) 20 MG tablet Take 2 tablets (40 mg total) by mouth daily. 10 tablet 0  . ranolazine (RANEXA) 500 MG 12 hr tablet TK 1 T PO BID  11  . TOUJEO SOLOSTAR 300 UNIT/ML SOPN ADM 60 UNI [place] QD    . TRADJENTA 5 MG TABS tablet     . Vitamin D, Ergocalciferol, (DRISDOL) 50000 units CAPS capsule TK ONE C PO TWICE WEEKLY  0   No current facility-administered medications on file prior to visit.     Allergies  Allergen Reactions  . Sunflower Oil Anaphylaxis    Swelling of throat  . Bupropion Other (See Comments)    Tremors  . Glucophage [Metformin Hcl] Diarrhea    Social History   Occupational History  . Not on file  Tobacco Use  . Smoking status: Never Smoker  . Smokeless tobacco: Never Used  Substance and Sexual Activity  . Alcohol use: No  . Drug use: No  . Sexual activity: Not on file    History reviewed. No pertinent family history.  Immunization History  Administered Date(s) Administered  . Influenza,inj,quad, With Preservative 08/17/2018  . PFIZER SARS-COV-2 Vaccination 12/10/2019, 12/31/2019  . Pneumococcal Polysaccharide-23 07/09/2019     Objective: Vitals:   04/21/20 0838  Temp: (!) 97.5 F (36.4 C)    George Boyer is a/an 69 y.o. male  in NAD. AAO X 3.  Vascular Examination: Capillary fill time to digits <3 seconds b/l lower extremities. Palpable PT pulses b/l. Faintly palpable DP pulses b/l. Pedal hair absent b/l Skin temperature gradient within normal limits b/l.  Dermatological Examination: Pedal skin with normal turgor, texture and tone bilaterally. No open wounds bilaterally. No interdigital macerations bilaterally. Toenails L hallux, R hallux, R 2nd toe and R 5th toe elongated, discolored,  dystrophic, thickened, and crumbly with subungual debris and tenderness to dorsal palpation. Hyperkeratotic lesion(s) L hallux, submet head 1 left foot, submet head 1 right foot and submet head 5 right foot.  No erythema, no edema, no drainage, no flocculence.  Musculoskeletal Examination: Normal muscle strength 5/5 to all lower extremity muscle groups bilaterally. No pain crepitus or joint limitation noted with ROM b/l. Hallux valgus with bunion deformity noted L hallux, submet head 1 left foot, submet head 1 right foot and submet head 5 right foot. Patient ambulates independent of any assistive aids.  Neurological Examination: Protective sensation intact 5/5 intact bilaterally with 10g monofilament b/l. Vibratory sensation intact  b/l. Proprioception intact bilaterally.  Assessment: 1. Pain due to onychomycosis of toenails of both feet   2. Callus   3. Hallux valgus, acquired, bilateral   4. Type 2 diabetes mellitus without complication, with long-term current use of insulin (HCC)   5. Encounter for diabetic foot exam (HCC)    Plan: -Examined patient. -No new findings. No new orders. -Diabetic foot examination performed on today's visit. -Toenails L hallux, R hallux, R 2nd toe and R 5th toe debrided in length and girth without iatrogenic bleeding with sterile nail nipper and dremel.  -Callus(es) L hallux, submet head 1 left foot, submet head 1 right foot and submet head 5 right foot pared utilizing sterile scalpel blade without complication or incident. Total number debrided =4. -Patient to continue soft, supportive shoe gear daily. Start process for new diabetic shoes. -Patient to report any pedal injuries to medical professional immediately. -Patient/POA to call should there be question/concern in the interim.  Return in about 9 weeks (around 06/23/2020) for diabetic nail trim.

## 2020-04-21 NOTE — Patient Instructions (Signed)
Diabetes Mellitus and Foot Care Foot care is an important part of your health, especially when you have diabetes. Diabetes may cause you to have problems because of poor blood flow (circulation) to your feet and legs, which can cause your skin to:  Become thinner and drier.  Break more easily.  Heal more slowly.  Peel and crack. You may also have nerve damage (neuropathy) in your legs and feet, causing decreased feeling in them. This means that you may not notice minor injuries to your feet that could lead to more serious problems. Noticing and addressing any potential problems early is the best way to prevent future foot problems. How to care for your feet Foot hygiene  Wash your feet daily with warm water and mild soap. Do not use hot water. Then, pat your feet and the areas between your toes until they are completely dry. Do not soak your feet as this can dry your skin.  Trim your toenails straight across. Do not dig under them or around the cuticle. File the edges of your nails with an emery board or nail file.  Apply a moisturizing lotion or petroleum jelly to the skin on your feet and to dry, brittle toenails. Use lotion that does not contain alcohol and is unscented. Do not apply lotion between your toes. Shoes and socks  Wear clean socks or stockings every day. Make sure they are not too tight. Do not wear knee-high stockings since they may decrease blood flow to your legs.  Wear shoes that fit properly and have enough cushioning. Always look in your shoes before you put them on to be sure there are no objects inside.  To break in new shoes, wear them for just a few hours a day. This prevents injuries on your feet. Wounds, scrapes, corns, and calluses  Check your feet daily for blisters, cuts, bruises, sores, and redness. If you cannot see the bottom of your feet, use a mirror or ask someone for help.  Do not cut corns or calluses or try to remove them with medicine.  If you  find a minor scrape, cut, or break in the skin on your feet, keep it and the skin around it clean and dry. You may clean these areas with mild soap and water. Do not clean the area with peroxide, alcohol, or iodine.  If you have a wound, scrape, corn, or callus on your foot, look at it several times a day to make sure it is healing and not infected. Check for: ? Redness, swelling, or pain. ? Fluid or blood. ? Warmth. ? Pus or a bad smell. General instructions  Do not cross your legs. This may decrease blood flow to your feet.  Do not use heating pads or hot water bottles on your feet. They may burn your skin. If you have lost feeling in your feet or legs, you may not know this is happening until it is too late.  Protect your feet from hot and cold by wearing shoes, such as at the beach or on hot pavement.  Schedule a complete foot exam at least once a year (annually) or more often if you have foot problems. If you have foot problems, report any cuts, sores, or bruises to your health care provider immediately. Contact a health care provider if:  You have a medical condition that increases your risk of infection and you have any cuts, sores, or bruises on your feet.  You have an injury that is not   healing.  You have redness on your legs or feet.  You feel burning or tingling in your legs or feet.  You have pain or cramps in your legs and feet.  Your legs or feet are numb.  Your feet always feel cold.  You have pain around a toenail. Get help right away if:  You have a wound, scrape, corn, or callus on your foot and: ? You have pain, swelling, or redness that gets worse. ? You have fluid or blood coming from the wound, scrape, corn, or callus. ? Your wound, scrape, corn, or callus feels warm to the touch. ? You have pus or a bad smell coming from the wound, scrape, corn, or callus. ? You have a fever. ? You have a red line going up your leg. Summary  Check your feet every day  for cuts, sores, red spots, swelling, and blisters.  Moisturize feet and legs daily.  Wear shoes that fit properly and have enough cushioning.  If you have foot problems, report any cuts, sores, or bruises to your health care provider immediately.  Schedule a complete foot exam at least once a year (annually) or more often if you have foot problems. This information is not intended to replace advice given to you by your health care provider. Make sure you discuss any questions you have with your health care provider. Document Revised: 07/31/2019 Document Reviewed: 12/09/2016 Elsevier Patient Education  2020 [employer]  

## 2020-05-05 ENCOUNTER — Other Ambulatory Visit: Payer: Medicare Other | Admitting: Orthotics

## 2020-05-08 NOTE — Progress Notes (Signed)
Pts wife canceled appt stating pt does not want shoes.

## 2020-05-15 DIAGNOSIS — L539 Erythematous condition, unspecified: Secondary | ICD-10-CM | POA: Insufficient documentation

## 2020-06-04 DIAGNOSIS — L03113 Cellulitis of right upper limb: Secondary | ICD-10-CM

## 2020-06-04 HISTORY — DX: Cellulitis of right upper limb: L03.113

## 2020-07-22 ENCOUNTER — Other Ambulatory Visit: Payer: Self-pay

## 2020-07-22 ENCOUNTER — Ambulatory Visit: Payer: Medicare Other | Admitting: Podiatry

## 2020-08-03 IMAGING — US US RENAL
1 series · 14 of 25 positions shown · non-contrast
Comparison: None.

CLINICAL DATA: Initial evaluation for chronic kidney disease stage
3

EXAM:
RENAL / URINARY TRACT ULTRASOUND COMPLETE

[Series 1: us renal · 0.25mm/px · 14 of 39 slices shown]
[im 1/39]
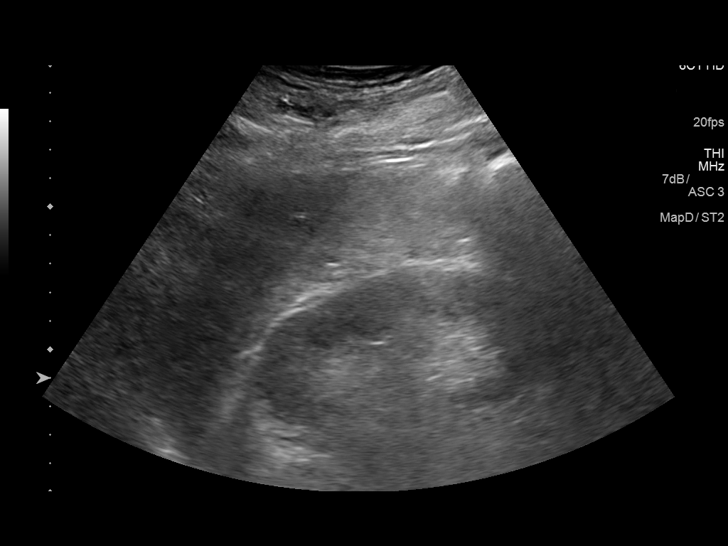
[im 4/39]
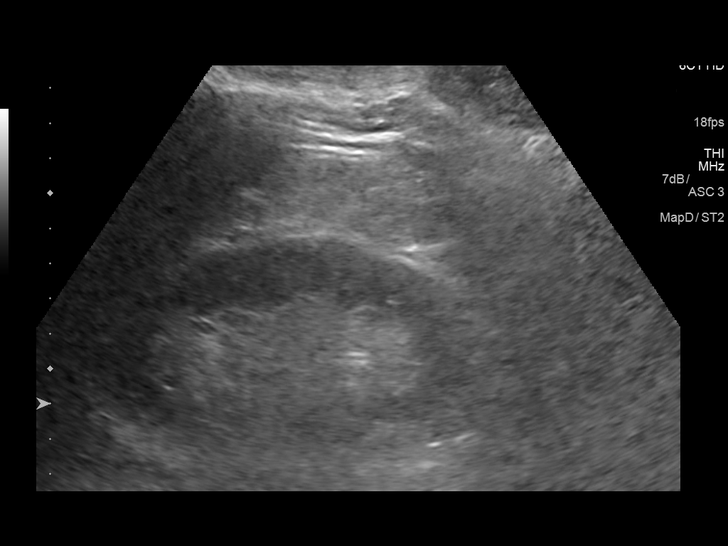
[im 7/39]
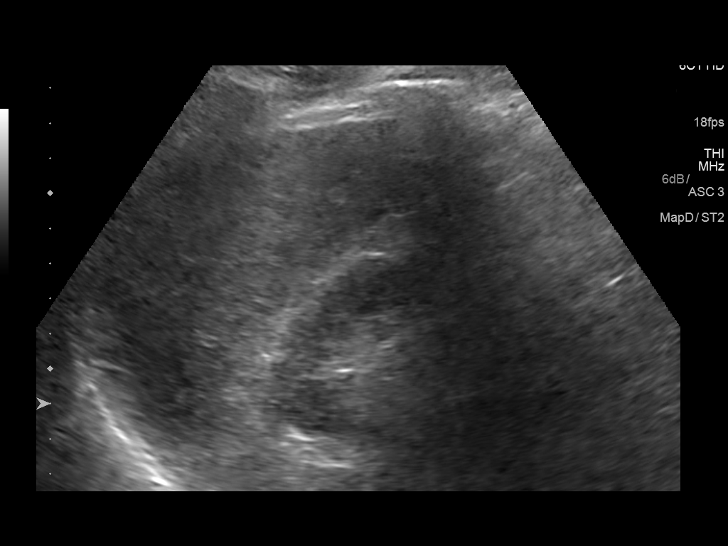
[im 10/39]
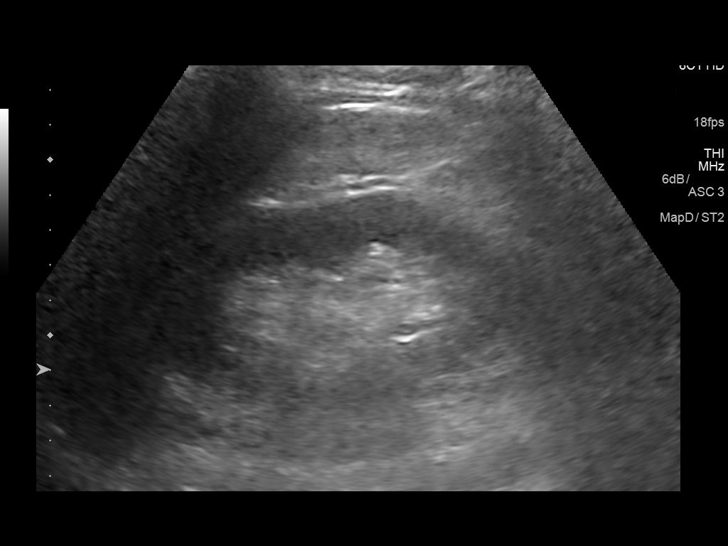
[im 13/39]
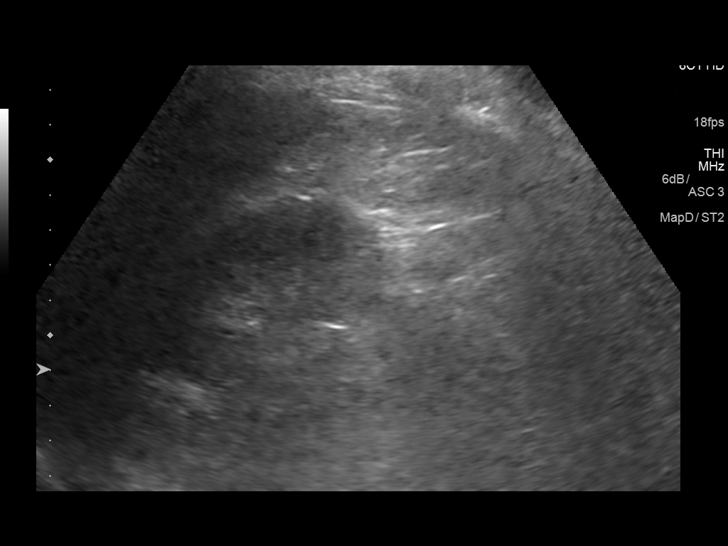
[im 15/39]
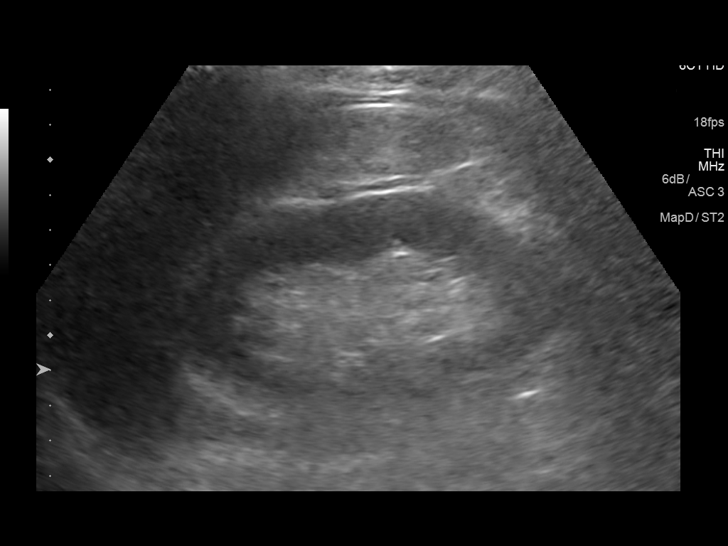
[im 18/39]
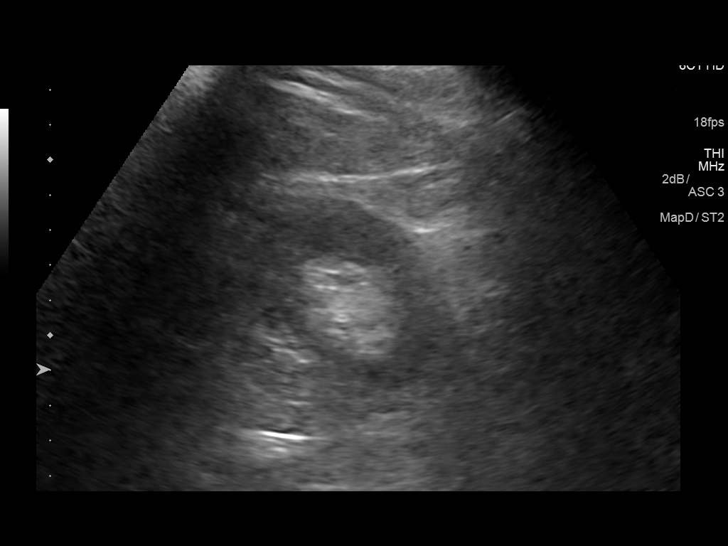
[im 21/39]
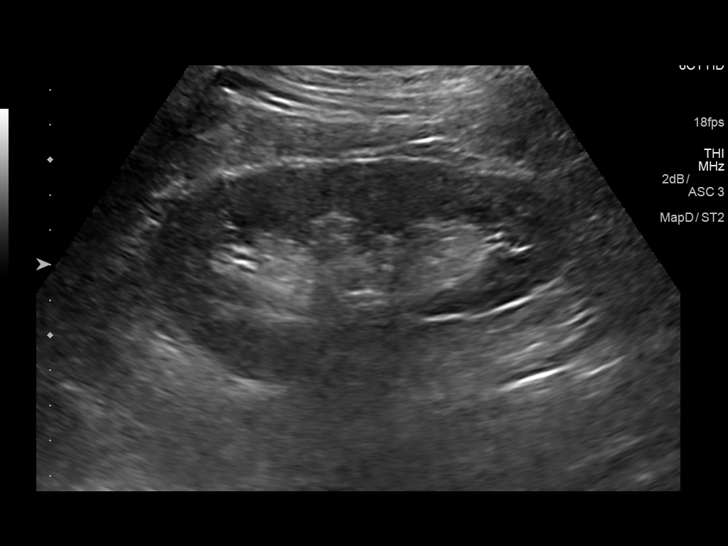
[im 24/39]
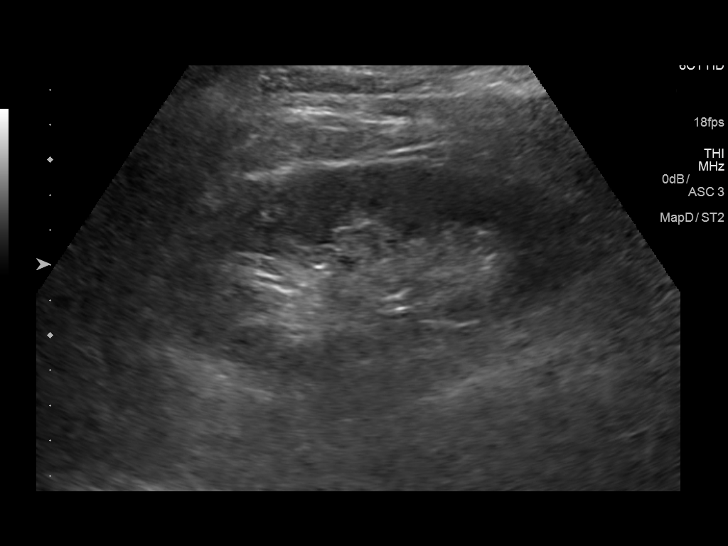
[im 26/39]
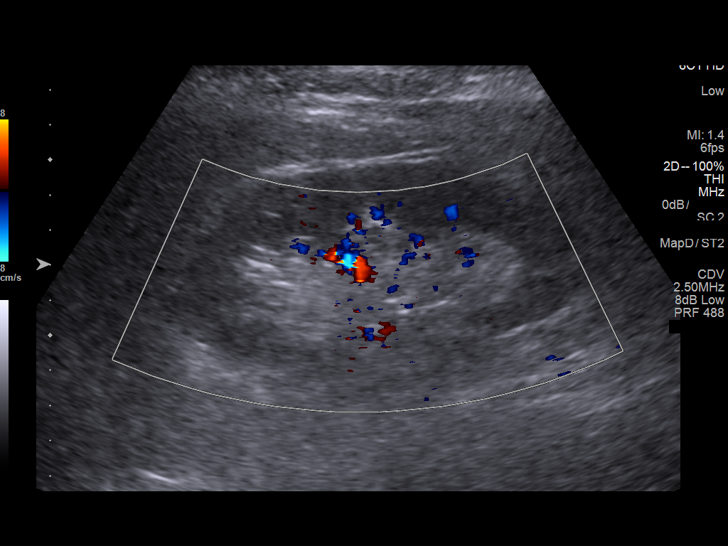
[im 29/39]
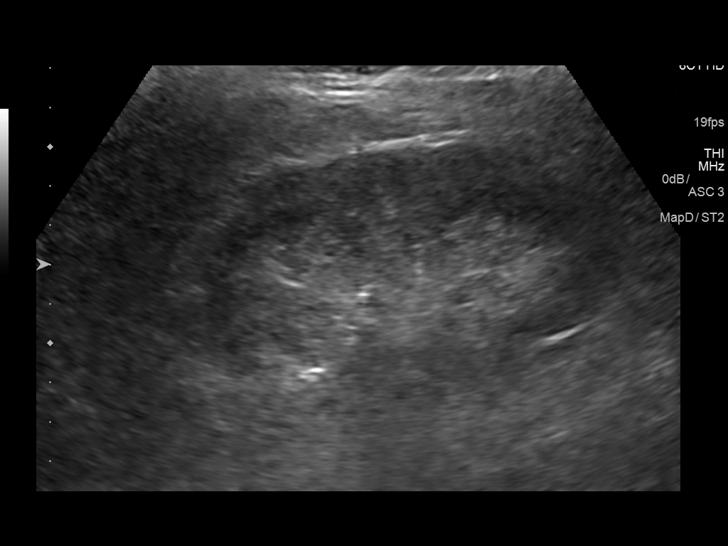
[im 32/39]
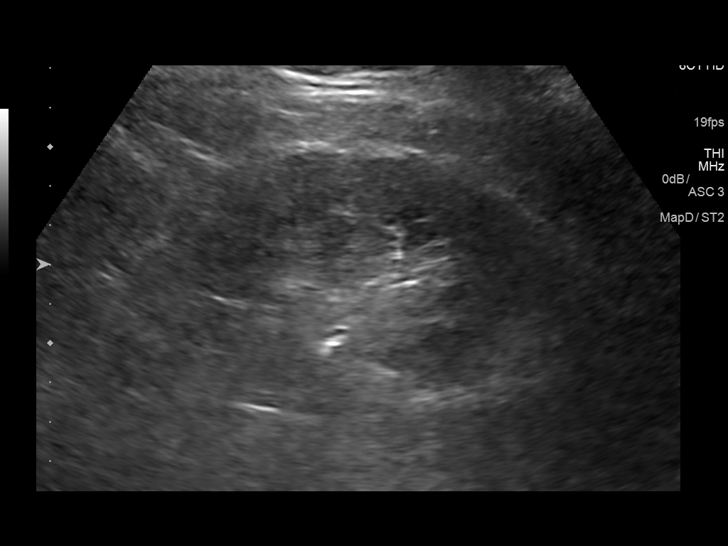
[im 35/39]
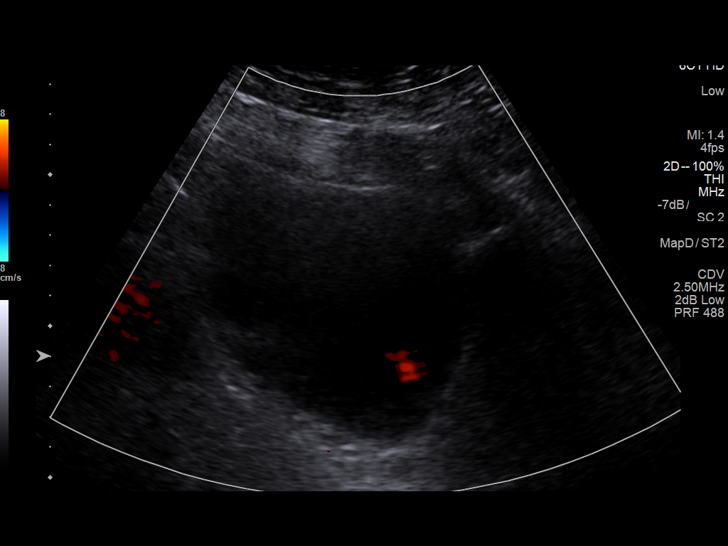
[im 39/39]
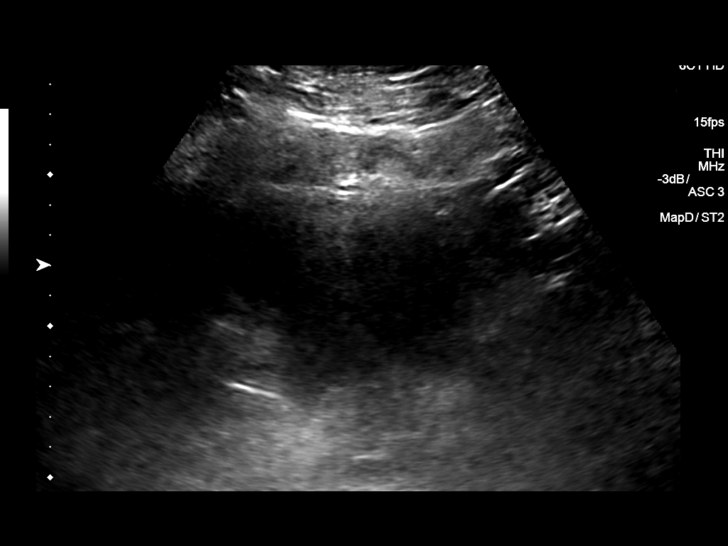

[14 of 25 positions shown; findings below may reference images not displayed]

FINDINGS: Right Kidney:

Renal measurements: 10.2 x 5.7 x 6.5 cm = volume: 199.5 mL.
Increased echogenicity within the renal parenchyma. No mass or
hydronephrosis visualized.

Left Kidney:

Renal measurements: 12.2 x 5.5 x 5.6 cm = volume: 196.2 mL.
Increased echogenicity within the renal parenchyma. No mass or
hydronephrosis visualized.

Bladder:

Appears normal for degree of bladder distention.
IMPRESSION: 1. Increased echogenicity within the renal parenchyma, compatible
with medical renal disease.
2. No hydronephrosis.

## 2020-08-05 ENCOUNTER — Other Ambulatory Visit: Payer: Self-pay

## 2020-08-05 ENCOUNTER — Ambulatory Visit: Payer: Medicare Other | Admitting: Podiatry

## 2020-08-05 ENCOUNTER — Encounter: Payer: Self-pay | Admitting: Podiatry

## 2020-08-05 DIAGNOSIS — Z794 Long term (current) use of insulin: Secondary | ICD-10-CM

## 2020-08-05 DIAGNOSIS — M79674 Pain in right toe(s): Secondary | ICD-10-CM

## 2020-08-05 DIAGNOSIS — L84 Corns and callosities: Secondary | ICD-10-CM

## 2020-08-05 DIAGNOSIS — B351 Tinea unguium: Secondary | ICD-10-CM | POA: Diagnosis not present

## 2020-08-05 DIAGNOSIS — M79675 Pain in left toe(s): Secondary | ICD-10-CM

## 2020-08-05 DIAGNOSIS — E119 Type 2 diabetes mellitus without complications: Secondary | ICD-10-CM

## 2020-08-07 NOTE — Progress Notes (Signed)
Subjective: George Boyer presents today for for annual diabetic foot examination and painful callus(es) b/l feet and painful mycotic toenails b/l that are difficult to trim. Pain interferes with ambulation. Aggravating factors include wearing enclosed shoe gear. Pain is relieved with periodic professional debridement.   He voices no new pedal concerns on today's visit.  Past Medical History:  Diagnosis Date   Asthma    Atrial fibrillation (HCC)    Coronary artery disease    Diabetes mellitus without complication (HCC)    Hypertension     Patient Active Problem List   Diagnosis Date Noted   Cellulitis of right hand 06/04/2020   Erythema of hand 05/15/2020   Diastolic dysfunction 04/08/2020   Anemia due to chronic kidney disease 07/11/2019   CKD stage 3 due to type 2 diabetes mellitus (HCC) 07/11/2019   Microalbuminuria 07/11/2019   SOB (shortness of breath) on exertion 04/19/2017   S/P coronary artery stent placement 05/04/2016   Obstructive sleep apnea 09/29/2015   High risk medication use 08/21/2014   Body mass index 40.0-44.9, adult (HCC) 08/14/2013   Anxiety and depression 08/15/2012   Arthralgia 08/15/2012   Bradycardia 08/15/2012   CAD in native artery 08/15/2012   Central obesity 08/15/2012   Dependent edema 08/15/2012   Dyslipidemia 08/15/2012   Erectile dysfunction 08/15/2012   HTN (hypertension) 08/15/2012   T2DM (type 2 diabetes mellitus) (HCC) 08/15/2012   Vitamin D deficiency 08/15/2012   Hyperlipidemia LDL goal <100 08/15/2012   Unspecified atrial fibrillation (HCC) 07/13/2012   Long term (current) use of anticoagulants 07/13/2012    Past Surgical History:  Procedure Laterality Date   CORONARY STENT PLACEMENT     TONSILLECTOMY      Current Outpatient Medications on File Prior to Visit  Medication Sig Dispense Refill   isosorbide mononitrate (IMDUR) 30 MG 24 hr tablet Take by mouth.     ACCU-CHEK AVIVA PLUS test strip  USE TO TEST BLOOD SUGARS FOUR TIMES DAILY DX E11.9 AND Z79.4  1   Accu-Chek FastClix Lancets MISC Use to monitor blood glucose 4 time(s) daily DX CODE E11.9 and Z79.4     amiodarone (PACERONE) 200 MG tablet Take 200 mg by mouth daily.     amLODipine (NORVASC) 5 MG tablet TK 1 T PO QAM  0   apixaban (ELIQUIS) 5 MG TABS tablet Take 5 mg by mouth 2 (two) times daily.     atorvastatin (LIPITOR) 40 MG tablet Take 40 mg by mouth daily.     benzonatate (TESSALON) 100 MG capsule TK ONE C PO TID FOR 10 DAYS  0   buPROPion (WELLBUTRIN XL) 150 MG 24 hr tablet TAKE 1 TABLET BY MOUTH EVERY DAY IN THE MORNING  0   cholecalciferol (VITAMIN D) 1000 UNITS tablet Take 2,000 Units by mouth daily.     cyanocobalamin (,VITAMIN B-12,) 1000 MCG/ML injection INJECT 1 ML IN THE MUSCLE EVERY 30 DAYS     cyclobenzaprine (FLEXERIL) 10 MG tablet Take 1 tablet (10 mg total) by mouth 2 (two) times daily as needed for muscle spasms. 20 tablet 0   fluticasone (FLOVENT HFA) 220 MCG/ACT inhaler Inhale into the lungs.     Folic Acid-Cholecalciferol 01-2439 MG-UNIT TABS Take by mouth.     furosemide (LASIX) 40 MG tablet Take 40 mg by mouth daily.     HYDROcodone-acetaminophen (NORCO/VICODIN) 5-325 MG per tablet Take 1-2 tablets by mouth every 6 (six) hours as needed for pain. 30 tablet 0   hydrocortisone 2.5 % ointment  APP TOPICALLY AA BID FOR 7 DAYS  0   Insulin Glargine, 1 Unit Dial, 300 UNIT/ML SOPN Inject 60 units under the skin once daily.     insulin NPH (HUMULIN N,NOVOLIN N) 100 UNIT/ML injection Inject 40 Units into the skin 2 (two) times daily. 30 units in the morning.  40-50 units in the evening.     insulin regular (HUMULIN R) 100 units/mL injection Inject 20 Units into the skin 3 (three) times daily before meals. 20 unit morning, 40 units lunch, and 30 units in the evening.     Insulin Syringe-Needle U-100 (INSULIN SYRINGE 1CC/31GX5/16") 31G X 5/16" 1 ML MISC Use with insulin five (5) times daily  DX  E11.42 and Z79.4     irbesartan (AVAPRO) 150 MG tablet Take 150 mg by mouth daily.     nitroGLYCERIN (NITROSTAT) 0.4 MG SL tablet Place under the tongue.     pravastatin (PRAVACHOL) 20 MG tablet TK 1 T PO HS  0   predniSONE (DELTASONE) 20 MG tablet Take 2 tablets (40 mg total) by mouth daily. 10 tablet 0   ranolazine (RANEXA) 500 MG 12 hr tablet TK 1 T PO BID  11   TOUJEO SOLOSTAR 300 UNIT/ML SOPN ADM 60 UNI [place] QD     TRADJENTA 5 MG TABS tablet      Vitamin D, Ergocalciferol, (DRISDOL) 50000 units CAPS capsule TK ONE C PO TWICE WEEKLY  0   No current facility-administered medications on file prior to visit.     Allergies  Allergen Reactions   Sunflower Oil Anaphylaxis    Swelling of throat Swelling of throat Swelling of throat-any sunflower product Swelling of throat-any sunflower product    Bupropion Other (See Comments)    Tremors   Glucophage [Metformin Hcl] Diarrhea    Social History   Occupational History   Not on file  Tobacco Use   Smoking status: Never Smoker   Smokeless tobacco: Never Used  Substance and Sexual Activity   Alcohol use: No   Drug use: No   Sexual activity: Not on file    History reviewed. No pertinent family history.  Immunization History  Administered Date(s) Administered   Influenza,inj,quad, With Preservative 08/17/2018   PFIZER SARS-COV-2 Vaccination 12/10/2019, 12/31/2019   Pneumococcal Polysaccharide-23 07/09/2019     Objective: There were no vitals filed for this visit.  George Boyer is a/an 69 y.o. Caucasian male in NAD. AAO X 3.  Vascular Examination: Capillary fill time to digits <3 seconds b/l lower extremities. Palpable PT pulses b/l. Faintly palpable DP pulses b/l. Pedal hair absent b/l Skin temperature gradient within normal limits b/l.  Dermatological Examination: Pedal skin with normal turgor, texture and tone bilaterally. No open wounds bilaterally. No interdigital macerations bilaterally. Toenails L  hallux, R hallux, R 2nd toe and R 5th toe elongated, discolored, dystrophic, thickened, and crumbly with subungual debris and tenderness to dorsal palpation. Hyperkeratotic lesion(s) L hallux, submet head 1 left foot, submet head 1 right foot and submet head 5 right foot.  No erythema, no edema, no drainage, no flocculence.  Musculoskeletal Examination: Normal muscle strength 5/5 to all lower extremity muscle groups bilaterally. No pain crepitus or joint limitation noted with ROM b/l. Hallux valgus with bunion deformity noted L hallux, submet head 1 left foot, submet head 1 right foot and submet head 5 right foot. Patient ambulates independent of any assistive aids.  Neurological Examination: Protective sensation intact 5/5 intact bilaterally with 10g monofilament b/l. Vibratory sensation intact b/l.  Proprioception intact bilaterally.  Assessment: 1. Pain due to onychomycosis of toenails of both feet   2. Corns and callosities   3. Type 2 diabetes mellitus without complication, with long-term current use of insulin (HCC)     Plan: -Examined patient. -No new findings. No new orders. -Diabetic foot examination performed on today's visit. -Toenails L hallux, R hallux, R 2nd toe and R 5th toe debrided in length and girth without iatrogenic bleeding with sterile nail nipper and dremel.  -Callus(es) L hallux, submet head 1 left foot, submet head 1 right foot and submet head 5 right foot pared utilizing sterile scalpel blade without complication or incident. Total number debrided =4. -Patient to continue soft, supportive shoe gear daily. Start process for new diabetic shoes. -Patient to report any pedal injuries to medical professional immediately. -Patient/POA to call should there be question/concern in the interim.  Return in about 3 months (around 11/04/2020) for nail trim.

## 2020-11-04 ENCOUNTER — Ambulatory Visit: Payer: Medicare Other | Admitting: Podiatry

## 2020-11-09 ENCOUNTER — Other Ambulatory Visit: Payer: Self-pay

## 2020-11-09 ENCOUNTER — Encounter: Payer: Self-pay | Admitting: Podiatry

## 2020-11-09 ENCOUNTER — Ambulatory Visit: Payer: Medicare Other | Admitting: Podiatry

## 2020-11-09 DIAGNOSIS — M2011 Hallux valgus (acquired), right foot: Secondary | ICD-10-CM

## 2020-11-09 DIAGNOSIS — E0822 Diabetes mellitus due to underlying condition with diabetic chronic kidney disease: Secondary | ICD-10-CM

## 2020-11-09 DIAGNOSIS — N183 Chronic kidney disease, stage 3 unspecified: Secondary | ICD-10-CM

## 2020-11-09 DIAGNOSIS — L84 Corns and callosities: Secondary | ICD-10-CM

## 2020-11-09 DIAGNOSIS — B351 Tinea unguium: Secondary | ICD-10-CM

## 2020-11-09 DIAGNOSIS — M2012 Hallux valgus (acquired), left foot: Secondary | ICD-10-CM

## 2020-11-09 DIAGNOSIS — M79674 Pain in right toe(s): Secondary | ICD-10-CM | POA: Diagnosis not present

## 2020-11-09 DIAGNOSIS — Z794 Long term (current) use of insulin: Secondary | ICD-10-CM | POA: Diagnosis not present

## 2020-11-09 DIAGNOSIS — M79675 Pain in left toe(s): Secondary | ICD-10-CM | POA: Diagnosis not present

## 2020-11-15 NOTE — Progress Notes (Signed)
Subjective:  [name]: George Boyer, male    DOB: 11-24-1950,  MRN: [id number]  69 y.o. male presents with preventative diabetic foot care and callus(es) b/l feet and painful thick toenails that are difficult to trim. Painful toenails interfere with ambulation. Aggravating factors include wearing enclosed shoe gear. Pain is relieved with periodic professional debridement. Painful calluses are aggravated when weightbearing with and without shoegear. Pain is relieved with periodic professional debridement.Marland Kitchen    PCP: Teodoro Spray, MD and last visit was: 08/13/2020.  Review of Systems: Negative except as noted in the HPI.  Past Medical History:  Diagnosis Date  . Asthma   . Atrial fibrillation (HCC)   . Coronary artery disease   . Diabetes mellitus without complication (HCC)   . Hypertension    Past Surgical History:  Procedure Laterality Date  . CORONARY STENT PLACEMENT    . TONSILLECTOMY     Patient Active Problem List   Diagnosis Date Noted  . Cellulitis of right hand 06/04/2020  . Erythema of hand 05/15/2020  . Diastolic dysfunction 04/08/2020  . Anemia due to chronic kidney disease 07/11/2019  . CKD stage 3 due to type 2 diabetes mellitus (HCC) 07/11/2019  . Microalbuminuria 07/11/2019  . SOB (shortness of breath) on exertion 04/19/2017  . S/P coronary artery stent placement 05/04/2016  . Obstructive sleep apnea 09/29/2015  . High risk medication use 08/21/2014  . Body mass index 40.0-44.9, adult (HCC) 08/14/2013  . Anxiety and depression 08/15/2012  . Arthralgia 08/15/2012  . Bradycardia 08/15/2012  . CAD in native artery 08/15/2012  . Central obesity 08/15/2012  . Dependent edema 08/15/2012  . Dyslipidemia 08/15/2012  . Erectile dysfunction 08/15/2012  . HTN (hypertension) 08/15/2012  . T2DM (type 2 diabetes mellitus) (HCC) 08/15/2012  . Vitamin D deficiency 08/15/2012  . Hyperlipidemia LDL goal <100 08/15/2012  . Unspecified atrial fibrillation (HCC) 07/13/2012   . Long term (current) use of anticoagulants 07/13/2012    Current Outpatient Medications:  .  ACCU-CHEK AVIVA PLUS test strip, USE TO TEST BLOOD SUGARS FOUR TIMES DAILY DX E11.9 AND Z79.4, Disp: , Rfl: 1 .  Accu-Chek FastClix Lancets MISC, Use to monitor blood glucose 4 time(s) daily DX CODE E11.9 and Z79.4, Disp: , Rfl:  .  amiodarone (PACERONE) 200 MG tablet, Take 200 mg by mouth daily., Disp: , Rfl:  .  amLODipine (NORVASC) 5 MG tablet, TK 1 T PO QAM, Disp: , Rfl: 0 .  apixaban (ELIQUIS) 5 MG TABS tablet, Take 5 mg by mouth 2 (two) times daily., Disp: , Rfl:  .  atorvastatin (LIPITOR) 40 MG tablet, Take 40 mg by mouth daily., Disp: , Rfl:  .  benzonatate (TESSALON) 100 MG capsule, TK ONE C PO TID FOR 10 DAYS, Disp: , Rfl: 0 .  buPROPion (WELLBUTRIN XL) 150 MG 24 hr tablet, TAKE 1 TABLET BY MOUTH EVERY DAY IN THE MORNING, Disp: , Rfl: 0 .  cholecalciferol (VITAMIN D) 1000 UNITS tablet, Take 2,000 Units by mouth daily., Disp: , Rfl:  .  cyanocobalamin (,VITAMIN B-12,) 1000 MCG/ML injection, INJECT 1 ML IN THE MUSCLE EVERY 30 DAYS, Disp: , Rfl:  .  cyclobenzaprine (FLEXERIL) 10 MG tablet, Take 1 tablet (10 mg total) by mouth 2 (two) times daily as needed for muscle spasms., Disp: 20 tablet, Rfl: 0 .  fluticasone (FLOVENT HFA) 220 MCG/ACT inhaler, Inhale into the lungs., Disp: , Rfl:  .  Folic Acid-Cholecalciferol 07-3817 MG-UNIT TABS, Take by mouth., Disp: , Rfl:  .  furosemide (  LASIX) 40 MG tablet, Take 40 mg by mouth daily., Disp: , Rfl:  .  HYDROcodone-acetaminophen (NORCO/VICODIN) 5-325 MG per tablet, Take 1-2 tablets by mouth every 6 (six) hours as needed for pain., Disp: 30 tablet, Rfl: 0 .  hydrocortisone 2.5 % ointment, APP TOPICALLY AA BID FOR 7 DAYS, Disp: , Rfl: 0 .  Insulin Glargine, 1 Unit Dial, 300 UNIT/ML SOPN, Inject 60 units under the skin once daily., Disp: , Rfl:  .  insulin NPH (HUMULIN N,NOVOLIN N) 100 UNIT/ML injection, Inject 40 Units into the skin 2 (two) times daily. 30  units in the morning.  40-50 units in the evening., Disp: , Rfl:  .  insulin regular (HUMULIN R) 100 units/mL injection, Inject 20 Units into the skin 3 (three) times daily before meals. 20 unit morning, 40 units lunch, and 30 units in the evening., Disp: , Rfl:  .  Insulin Syringe-Needle U-100 (INSULIN SYRINGE 1CC/31GX5/16") 31G X 5/16" 1 ML MISC, Use with insulin five (5) times daily  DX E11.42 and Z79.4, Disp: , Rfl:  .  irbesartan (AVAPRO) 150 MG tablet, Take 150 mg by mouth daily., Disp: , Rfl:  .  isosorbide mononitrate (IMDUR) 30 MG 24 hr tablet, Take by mouth., Disp: , Rfl:  .  nitroGLYCERIN (NITROSTAT) 0.4 MG SL tablet, Place under the tongue., Disp: , Rfl:  .  pravastatin (PRAVACHOL) 20 MG tablet, TK 1 T PO HS, Disp: , Rfl: 0 .  predniSONE (DELTASONE) 20 MG tablet, Take 2 tablets (40 mg total) by mouth daily., Disp: 10 tablet, Rfl: 0 .  ranolazine (RANEXA) 500 MG 12 hr tablet, TK 1 T PO BID, Disp: , Rfl: 11 .  TOUJEO SOLOSTAR 300 UNIT/ML SOPN, ADM 60 UNI [place] QD, Disp: , Rfl:  .  TRADJENTA 5 MG TABS tablet, , Disp: , Rfl:  .  Vitamin D, Ergocalciferol, (DRISDOL) 50000 units CAPS capsule, TK ONE C PO TWICE WEEKLY, Disp: , Rfl: 0 Allergies  Allergen Reactions  . Sunflower Oil Anaphylaxis    Swelling of throat Swelling of throat Swelling of throat-any sunflower product Swelling of throat-any sunflower product   . Bupropion Other (See Comments)    Tremors  . Glucophage [Metformin Hcl] Diarrhea   Social History   Tobacco Use  Smoking Status Never Smoker  Smokeless Tobacco Never Used    Objective:  There were no vitals filed for this visit. Constitutional Patient is a pleasant 69 y.o. Caucasian male morbidly obese in NAD. AAO x 3.  Vascular Capillary fill time to digits <3 seconds b/l lower extremities. Palpable PT pulse(s) b/l lower extremities Faintly palpable DP pulse(s) b/l lower extremities. Pedal hair absent. Lower extremity skin temperature gradient within normal limits. No  cyanosis or clubbing noted.  Neurologic Normal speech. Protective sensation intact 5/5 intact bilaterally with 10g monofilament b/l. Vibratory sensation intact b/l.  Dermatologic Pedal skin with normal turgor, texture and tone bilaterally. No open wounds bilaterally. No interdigital macerations bilaterally. Toenails L hallux, R hallux, R 2nd toe and R 5th toe elongated, discolored, dystrophic, thickened, and crumbly with subungual debris and tenderness to dorsal palpation. Hyperkeratotic lesion(s) L hallux, submet head 1 left foot, submet head 1 right foot and submet head 5 right foot.  No erythema, no edema, no drainage, no fluctuance.  Orthopedic: Normal muscle strength 5/5 to all lower extremity muscle groups bilaterally. No pain crepitus or joint limitation noted with ROM b/l. Hallux valgus with bunion deformity noted b/l lower extremities. Patient ambulates independent of any assistive aids.  Assessment:   1. Pain due to onychomycosis of toenails of both feet   2. Callus   3. Hallux valgus, acquired, bilateral   4. Diabetes mellitus due to underlying condition with stage 3 chronic kidney disease, with long-term current use of insulin, unspecified whether stage 3a or 3b CKD (HCC)    Plan:  Patient was evaluated and treated and all questions answered.  Onychomycosis with pain -Nails palliatively debridement as below. -Educated on self-care  Procedure: Nail Debridement Rationale: Pain Type of Debridement: manual, sharp debridement. Instrumentation: Nail nipper, rotary burr. Number of Nails: 4  -Examined patient. -Continue diabetic foot care principles. -Patient to continue soft, supportive shoe gear daily. -Toenails L hallux, R hallux, R 2nd toe and R 5th toe debrided in length and girth without iatrogenic bleeding with sterile nail nipper and dremel.  -Nondystrophic toenails trimmed L 2nd toe, L 3rd toe, L 4th toe, L 5th toe, R 3rd toe and R 4th toe. -Callus(es) L hallux, submet  head 1 left foot, submet head 1 right foot and submet head 5 right foot pared utilizing sterile scalpel blade without complication or incident. Total number debrided =4. -Patient to report any pedal injuries to medical professional immediately. -Patient/POA to call should there be question/concern in the interim.  Return in about 3 months (around 02/07/2021) for diabetic foot care.  Freddie Breech, DPM

## 2021-01-27 DIAGNOSIS — I350 Nonrheumatic aortic (valve) stenosis: Secondary | ICD-10-CM | POA: Insufficient documentation

## 2021-01-27 DIAGNOSIS — R0989 Other specified symptoms and signs involving the circulatory and respiratory systems: Secondary | ICD-10-CM | POA: Insufficient documentation

## 2021-02-10 ENCOUNTER — Encounter: Payer: Self-pay | Admitting: Podiatry

## 2021-02-10 ENCOUNTER — Ambulatory Visit: Payer: Medicare Other | Admitting: Podiatry

## 2021-02-10 ENCOUNTER — Other Ambulatory Visit: Payer: Self-pay

## 2021-02-10 DIAGNOSIS — M79675 Pain in left toe(s): Secondary | ICD-10-CM

## 2021-02-10 DIAGNOSIS — B351 Tinea unguium: Secondary | ICD-10-CM | POA: Diagnosis not present

## 2021-02-10 DIAGNOSIS — Z794 Long term (current) use of insulin: Secondary | ICD-10-CM

## 2021-02-10 DIAGNOSIS — M79674 Pain in right toe(s): Secondary | ICD-10-CM

## 2021-02-10 DIAGNOSIS — N183 Chronic kidney disease, stage 3 unspecified: Secondary | ICD-10-CM | POA: Diagnosis not present

## 2021-02-10 DIAGNOSIS — E0822 Diabetes mellitus due to underlying condition with diabetic chronic kidney disease: Secondary | ICD-10-CM

## 2021-02-10 DIAGNOSIS — M2011 Hallux valgus (acquired), right foot: Secondary | ICD-10-CM

## 2021-02-10 DIAGNOSIS — M2012 Hallux valgus (acquired), left foot: Secondary | ICD-10-CM

## 2021-02-10 DIAGNOSIS — L84 Corns and callosities: Secondary | ICD-10-CM | POA: Diagnosis not present

## 2021-02-14 NOTE — Progress Notes (Signed)
Subjective:  [name]: George Boyer, male    DOB: 11-Aug-1951,  MRN: [id number]  70 y.o. male presents with preventative diabetic foot care and callus(es) b/l feet and painful thick toenails that are difficult to trim. Painful toenails interfere with ambulation. Aggravating factors include wearing enclosed shoe gear. Pain is relieved with periodic professional debridement. Painful calluses are aggravated when weightbearing with and without shoegear. Pain is relieved with periodic professional debridement.   He is ready for his new diabetic shoes.   PCP: Teodoro Spray, MD and last visit was: 08/13/2020.  Review of Systems: Negative except as noted in the HPI.   Allergies  Allergen Reactions  . Sunflower Oil Anaphylaxis    Swelling of throat Swelling of throat Swelling of throat-any sunflower product Swelling of throat-any sunflower product   . Bupropion Other (See Comments)    Tremors  . Glucophage [Metformin Hcl] Diarrhea   Objective:  There were no vitals filed for this visit. Constitutional Patient is a pleasant 70 y.o. Caucasian male morbidly obese in NAD. AAO x 3.  Vascular Capillary fill time to digits <3 seconds b/l lower extremities. Palpable PT pulse(s) b/l lower extremities Faintly palpable DP pulse(s) b/l lower extremities. Pedal hair absent. Lower extremity skin temperature gradient within normal limits. No cyanosis or clubbing noted.  Neurologic Normal speech. Protective sensation intact 5/5 intact bilaterally with 10g monofilament b/l. Vibratory sensation intact b/l.  Dermatologic Pedal skin with normal turgor, texture and tone bilaterally. No open wounds bilaterally. No interdigital macerations bilaterally. Toenails L hallux, R hallux, R 2nd toe and R 5th toe elongated, discolored, dystrophic, thickened, and crumbly with subungual debris and tenderness to dorsal palpation. Hyperkeratotic lesion(s) L hallux, submet head 1 left foot, submet head 1 right foot and submet  head 5 right foot.  No erythema, no edema, no drainage, no fluctuance.  Orthopedic: Normal muscle strength 5/5 to all lower extremity muscle groups bilaterally. No pain crepitus or joint limitation noted with ROM b/l. Hallux valgus with bunion deformity noted b/l lower extremities. Patient ambulates independent of any assistive aids.     Assessment:   1. Pain due to onychomycosis of toenails of both feet   2. Callus   3. Hallux valgus, acquired, bilateral   4. Diabetes mellitus due to underlying condition with stage 3 chronic kidney disease, with long-term current use of insulin, unspecified whether stage 3a or 3b CKD (HCC)    Plan:  Patient was evaluated and treated and all questions answered.  Onychomycosis with pain -Nails palliatively debridement as below. -Educated on self-care  Procedure: Nail Debridement Rationale: Pain Type of Debridement: manual, sharp debridement. Instrumentation: Nail nipper, rotary burr. Number of Nails: 4  -Examined patient. -Continue diabetic foot care principles. -Patient to continue soft, supportive shoe gear daily. Start procedure for diabetic shoes. Patient qualifies based on diagnoses. -Toenails L hallux, R hallux, R 2nd toe and R 5th toe debrided in length and girth without iatrogenic bleeding with sterile nail nipper and dremel.  -Nondystrophic toenails trimmed L 2nd toe, L 3rd toe, L 4th toe, L 5th toe, R 3rd toe and R 4th toe. -Callus(es) L hallux, submet head 1 left foot, submet head 1 right foot and submet head 5 right foot pared utilizing sterile scalpel blade without complication or incident. Total number debrided =4. -Patient to report any pedal injuries to medical professional immediately. -Patient/POA to call should there be question/concern in the interim.  Return in about 3 months (around 05/13/2021) for nail trim.  Lise Auer  Elisha Ponder, DPM

## 2021-03-03 ENCOUNTER — Telehealth: Payer: Self-pay | Admitting: Podiatry

## 2021-03-03 NOTE — Telephone Encounter (Signed)
LVM for patient to return call to get scheduled for casting of diabetic shoes 

## 2021-03-09 ENCOUNTER — Ambulatory Visit (INDEPENDENT_AMBULATORY_CARE_PROVIDER_SITE_OTHER): Payer: Medicare Other | Admitting: Podiatry

## 2021-03-09 ENCOUNTER — Other Ambulatory Visit: Payer: Self-pay

## 2021-03-09 DIAGNOSIS — M2011 Hallux valgus (acquired), right foot: Secondary | ICD-10-CM

## 2021-03-09 DIAGNOSIS — E0822 Diabetes mellitus due to underlying condition with diabetic chronic kidney disease: Secondary | ICD-10-CM

## 2021-03-09 DIAGNOSIS — M2012 Hallux valgus (acquired), left foot: Secondary | ICD-10-CM

## 2021-03-09 DIAGNOSIS — N183 Chronic kidney disease, stage 3 unspecified: Secondary | ICD-10-CM

## 2021-03-09 DIAGNOSIS — Z794 Long term (current) use of insulin: Secondary | ICD-10-CM

## 2021-03-09 NOTE — Progress Notes (Signed)
Patient presented for foam casting for 3 pair custom diabetic shoe inserts. Patient is measured with a [place] Device to be a size 9 x-wide  Diabetic shoes are chosen from the [employer] The shoes chosen are [id number]  The patient will be contacted when the shoes and inserts are ready to be picked up.

## 2021-05-28 ENCOUNTER — Other Ambulatory Visit: Payer: Self-pay

## 2021-05-28 ENCOUNTER — Ambulatory Visit: Payer: Medicare Other | Admitting: Podiatry

## 2021-05-28 ENCOUNTER — Encounter: Payer: Self-pay | Admitting: Podiatry

## 2021-05-28 DIAGNOSIS — M79675 Pain in left toe(s): Secondary | ICD-10-CM

## 2021-05-28 DIAGNOSIS — B351 Tinea unguium: Secondary | ICD-10-CM | POA: Diagnosis not present

## 2021-05-28 DIAGNOSIS — M216X2 Other acquired deformities of left foot: Secondary | ICD-10-CM | POA: Diagnosis not present

## 2021-05-28 DIAGNOSIS — N183 Chronic kidney disease, stage 3 unspecified: Secondary | ICD-10-CM | POA: Diagnosis not present

## 2021-05-28 DIAGNOSIS — E0822 Diabetes mellitus due to underlying condition with diabetic chronic kidney disease: Secondary | ICD-10-CM | POA: Diagnosis not present

## 2021-05-28 DIAGNOSIS — M216X1 Other acquired deformities of right foot: Secondary | ICD-10-CM

## 2021-05-28 DIAGNOSIS — M79674 Pain in right toe(s): Secondary | ICD-10-CM

## 2021-05-28 DIAGNOSIS — Z794 Long term (current) use of insulin: Secondary | ICD-10-CM

## 2021-05-30 NOTE — Progress Notes (Signed)
Subjective: George Boyer is a pleasant 70 y.o. male patient seen today for at-risk foot care with h/o NIDDM and CKD. He is seen for calluses of both feet and painful thick toenails that are difficult to trim. Pain interferes with ambulation. Aggravating factors include wearing enclosed shoe gear. Pain is relieved with periodic professional debridement.  He states his calves feel tight on today's visit. He denies any swelling or episodes of trauma.  PCP is Teodoro Spray, MD.  Allergies  Allergen Reactions   Sunflower Oil Anaphylaxis    Swelling of throat Swelling of throat Swelling of throat-any sunflower product Swelling of throat-any sunflower product    Bupropion Other (See Comments)    Tremors   Glucophage [Metformin Hcl] Diarrhea    Objective: Physical Exam  General: George Boyer is a pleasant 70 y.o. Caucasian male, morbidly obese in NAD. AAO x 3.   Vascular:  Neurovascular status unchanged b/l lower extremities. Capillary fill time to digits <3 seconds b/l lower extremities. Palpable PT pulse(s) b/l lower extremities Faintly palpable DP pulse(s) b/l lower extremities. Pedal hair absent. Lower extremity skin temperature gradient within normal limits. No pain with calf compression b/l.  Dermatological:  Pedal skin with normal turgor, texture and tone b/l lower extremities No open wounds b/l lower extremities No interdigital macerations b/l lower extremities Toenails L hallux, R hallux, R 2nd toe, and R 5th toe elongated, discolored, dystrophic, thickened, and crumbly with subungual debris and tenderness to dorsal palpation. Hyperkeratotic lesion(s) L hallux, submet head 1 left foot, submet head 1 right foot, and submet head 5 right foot.  No erythema, no edema, no drainage, no fluctuance.  Musculoskeletal:  Normal muscle strength 5/5 to all lower extremity muscle groups bilaterally. Equinus noted b/l LE. No pain crepitus or joint limitation noted with ROM b/l. Hallux valgus with  bunion deformity noted b/l lower extremities.  Neurological:  Protective sensation intact 5/5 intact bilaterally with 10g monofilament b/l. Vibratory sensation intact b/l.  Assessment and Plan:  1. Pain due to onychomycosis of toenails of both feet   2. Acquired equinus deformity of both feet   3. Diabetes mellitus due to underlying condition with stage 3 chronic kidney disease, with long-term current use of insulin, unspecified whether stage 3a or 3b CKD (HCC)     -Examined patient. -Discussed equinus and treatment options including heel lifts and physical therapy. He would like to defer physical therapy for now. Dispensed felt heel lifts for shoes. Will reassess on next appointment or he should call office if condition worsens. -Patient to continue soft, supportive shoe gear daily. -Toenails L hallux, R hallux, R 2nd toe, and R 5th toe debrided in length and girth without iatrogenic bleeding with sterile nail nipper and dremel.  -Nondystrophic toenails trimmed L 2nd toe, L 3rd toe, L 4th toe, L 5th toe, R 3rd toe, and R 4th toe. -Patient to report any pedal injuries to medical professional immediately. -Patient/POA to call should there be question/concern in the interim.  Return in about 10 weeks (around 08/06/2021) for diabetic nail and callus trim.  Freddie Breech, DPM

## 2021-06-04 ENCOUNTER — Other Ambulatory Visit: Payer: Self-pay

## 2021-06-04 ENCOUNTER — Other Ambulatory Visit: Payer: Medicare Other

## 2021-06-04 DIAGNOSIS — E1140 Type 2 diabetes mellitus with diabetic neuropathy, unspecified: Secondary | ICD-10-CM | POA: Diagnosis not present

## 2021-06-04 DIAGNOSIS — M2012 Hallux valgus (acquired), left foot: Secondary | ICD-10-CM | POA: Diagnosis not present

## 2021-06-04 DIAGNOSIS — L84 Corns and callosities: Secondary | ICD-10-CM | POA: Diagnosis not present

## 2021-06-04 DIAGNOSIS — M2011 Hallux valgus (acquired), right foot: Secondary | ICD-10-CM | POA: Diagnosis not present

## 2021-08-18 ENCOUNTER — Ambulatory Visit: Payer: Medicare Other | Admitting: Podiatry

## 2021-08-25 ENCOUNTER — Encounter: Payer: Self-pay | Admitting: Podiatry

## 2021-08-25 ENCOUNTER — Ambulatory Visit (INDEPENDENT_AMBULATORY_CARE_PROVIDER_SITE_OTHER): Payer: Medicare Other | Admitting: Podiatry

## 2021-08-25 ENCOUNTER — Other Ambulatory Visit: Payer: Self-pay

## 2021-08-25 DIAGNOSIS — L84 Corns and callosities: Secondary | ICD-10-CM | POA: Diagnosis not present

## 2021-08-25 DIAGNOSIS — B351 Tinea unguium: Secondary | ICD-10-CM

## 2021-08-25 DIAGNOSIS — M79675 Pain in left toe(s): Secondary | ICD-10-CM | POA: Diagnosis not present

## 2021-08-25 DIAGNOSIS — M79674 Pain in right toe(s): Secondary | ICD-10-CM | POA: Diagnosis not present

## 2021-08-25 DIAGNOSIS — M2011 Hallux valgus (acquired), right foot: Secondary | ICD-10-CM | POA: Diagnosis not present

## 2021-08-25 DIAGNOSIS — M2012 Hallux valgus (acquired), left foot: Secondary | ICD-10-CM

## 2021-08-25 DIAGNOSIS — E0822 Diabetes mellitus due to underlying condition with diabetic chronic kidney disease: Secondary | ICD-10-CM | POA: Diagnosis not present

## 2021-08-25 DIAGNOSIS — Z794 Long term (current) use of insulin: Secondary | ICD-10-CM

## 2021-08-25 DIAGNOSIS — N183 Chronic kidney disease, stage 3 unspecified: Secondary | ICD-10-CM

## 2021-08-25 NOTE — Progress Notes (Signed)
This patient returns to my office for at risk foot care.  This patient requires this care by a professional since this patient will be at risk due to having CKD stage 3, T2DM and chronic anticoagulation.  He is taking eliquis.  This patient is unable to cut nails himself since the patient cannot reach his nails.These nails are painful walking and wearing shoes.  This patient presents for at risk foot care today.  General Appearance  Alert, conversant and in no acute stress.  Vascular  Dorsalis pedis and posterior tibial  pulses are  weakly palpable  bilaterally.  Capillary return is within normal limits  bilaterally. Temperature is within normal limits  bilaterally.  Neurologic  Senn-Weinstein monofilament wire test within normal limits  bilaterally. Muscle power within normal limits bilaterally.  Nails Thick disfigured discolored nails with subungual debris  from hallux to fifth toes bilaterally. No evidence of bacterial infection or drainage bilaterally.  Orthopedic  No limitations of motion  feet .  No crepitus or effusions noted.  HAV  B/L.  Hammer toes  B/L.  Hallux malleus  B/L.  Skin  normotropic skin with no porokeratosis noted bilaterally.  No signs of infections or ulcers noted.     Onychomycosis  Pain in right toes  Pain in left toes  Consent was obtained for treatment procedures.   Mechanical debridement of nails 1-5  bilaterally performed with a nail nipper.  Filed with dremel without incident.    Return office visit                     Told patient to return for periodic foot care and evaluation due to potential at risk complications.   Helane Gunther DPM

## 2021-12-07 ENCOUNTER — Ambulatory Visit (INDEPENDENT_AMBULATORY_CARE_PROVIDER_SITE_OTHER): Payer: Medicare Other | Admitting: Podiatry

## 2021-12-07 ENCOUNTER — Encounter: Payer: Self-pay | Admitting: Podiatry

## 2021-12-07 ENCOUNTER — Other Ambulatory Visit: Payer: Self-pay

## 2021-12-07 DIAGNOSIS — B351 Tinea unguium: Secondary | ICD-10-CM

## 2021-12-07 DIAGNOSIS — N183 Chronic kidney disease, stage 3 unspecified: Secondary | ICD-10-CM

## 2021-12-07 DIAGNOSIS — L84 Corns and callosities: Secondary | ICD-10-CM

## 2021-12-07 DIAGNOSIS — M79674 Pain in right toe(s): Secondary | ICD-10-CM | POA: Diagnosis not present

## 2021-12-07 DIAGNOSIS — E0822 Diabetes mellitus due to underlying condition with diabetic chronic kidney disease: Secondary | ICD-10-CM

## 2021-12-07 DIAGNOSIS — M79675 Pain in left toe(s): Secondary | ICD-10-CM | POA: Diagnosis not present

## 2021-12-07 DIAGNOSIS — Z794 Long term (current) use of insulin: Secondary | ICD-10-CM

## 2021-12-12 NOTE — Progress Notes (Signed)
°  Subjective:  [name]: George Boyer, male    DOB: July 04, 1951,  MRN: [id number]  George Boyer presents to clinic today for at risk foot care. Pt has h/o NIDDM with chronic kidney disease and callus(es) bilaterally and painful thick toenails that are difficult to trim. Painful toenails interfere with ambulation. Aggravating factors include wearing enclosed shoe gear. Pain is relieved with periodic professional debridement. Painful calluses are aggravated when weightbearing with and without shoegear. Pain is relieved with periodic professional debridement.  Patient states blood glucose was 178 mg/dl this morning.    PCP is Teodoro Spray, MD , and last visit was December, 2022.  Allergies  Allergen Reactions   Sunflower Oil Anaphylaxis    Swelling of throat Swelling of throat Swelling of throat-any sunflower product Swelling of throat-any sunflower product    Bupropion Other (See Comments)    Tremors   Glucophage [Metformin Hcl] Diarrhea    Review of Systems: Negative except as noted in the HPI. Objective:   Constitutional George Boyer is a pleasant 71 y.o. Caucasian male, morbidly obese in NAD. AAO x 3.   Vascular CFT immediate b/l LE. Palpable DP/PT pulses b/l LE. Digital hair sparse b/l. Skin temperature gradient WNL b/l. No pain with calf compression b/l. No edema noted b/l. No cyanosis or clubbing noted b/l LE.  Neurologic Normal speech. Oriented to person, place, and time. Protective sensation intact 5/5 intact bilaterally with 10g monofilament b/l. Vibratory sensation intact b/l.  Dermatologic Pedal skin is warm and supple b/l LE. No open wounds b/l LE. No interdigital macerations noted b/l LE. Toenails bilateral great toes, R 2nd toe, and R 5th toe elongated, discolored, dystrophic, thickened, and crumbly with subungual debris and tenderness to dorsal palpation. Nondystrophic toenails 2-5 left, R 3rd toe, and R 4th toe. Hyperkeratotic lesion(s) L hallux, submet head 1 b/l, and  submet head 5 right foot.  No erythema, no edema, no drainage, no fluctuance.  Orthopedic: Muscle strength 5/5 to all lower extremity muscle groups bilaterally. HAV with bunion deformity noted b/l LE. Hallux hammertoe noted b/l LE. Hammertoe deformity noted 2-5 b/l.   Radiographs: None  Assessment:   1. Pain due to onychomycosis of toenails of both feet   2. Callus   3. Diabetes mellitus due to underlying condition with stage 3 chronic kidney disease, with long-term current use of insulin, unspecified whether stage 3a or 3b CKD (HCC)    Plan:  Patient was evaluated and treated and all questions answered. Consent given for treatment as described below: -Patient to continue soft, supportive shoe gear daily. -Mycotic toenails bilateral great toes, R 2nd toe, and R 5th toe were debrided in length and girth with sterile nail nippers and dremel without iatrogenic bleeding. -Nondystrophic toenails trimmed 2-5 left, R 3rd toe, and R 4th toe. -Callus(es) L hallux, submet head 1 b/l, and submet head 5 right foot pared utilizing sterile scalpel blade without complication or incident. Total number debrided =4. -Patient/POA to call should there be question/concern in the interim.  Return in about 3 months (around 03/07/2022).  Freddie Breech, DPM

## 2022-01-31 DIAGNOSIS — I6523 Occlusion and stenosis of bilateral carotid arteries: Secondary | ICD-10-CM | POA: Insufficient documentation

## 2022-03-09 ENCOUNTER — Ambulatory Visit (INDEPENDENT_AMBULATORY_CARE_PROVIDER_SITE_OTHER): Payer: Medicare Other | Admitting: Podiatry

## 2022-03-09 ENCOUNTER — Encounter: Payer: Self-pay | Admitting: Podiatry

## 2022-03-09 DIAGNOSIS — M79674 Pain in right toe(s): Secondary | ICD-10-CM

## 2022-03-09 DIAGNOSIS — N183 Chronic kidney disease, stage 3 unspecified: Secondary | ICD-10-CM | POA: Diagnosis not present

## 2022-03-09 DIAGNOSIS — Z794 Long term (current) use of insulin: Secondary | ICD-10-CM | POA: Diagnosis not present

## 2022-03-09 DIAGNOSIS — E0822 Diabetes mellitus due to underlying condition with diabetic chronic kidney disease: Secondary | ICD-10-CM

## 2022-03-09 DIAGNOSIS — B351 Tinea unguium: Secondary | ICD-10-CM | POA: Diagnosis not present

## 2022-03-09 DIAGNOSIS — M79675 Pain in left toe(s): Secondary | ICD-10-CM

## 2022-03-09 DIAGNOSIS — L84 Corns and callosities: Secondary | ICD-10-CM | POA: Diagnosis not present

## 2022-03-09 NOTE — Progress Notes (Signed)
?  Subjective:  ?[name]: George Boyer, male    DOB: May 12, 1951,  MRN: [id number] ? ?George Boyer presents to clinic today for at risk foot care. Pt has h/o NIDDM with chronic kidney disease and callus(es) b/l feet and painful thick toenails that are difficult to trim. Painful toenails interfere with ambulation. Aggravating factors include wearing enclosed shoe gear. Pain is relieved with periodic professional debridement. Painful calluses are aggravated when weightbearing with and without shoegear. Pain is relieved with periodic professional debridement. ? ?Patient states blood glucose was 168 mg/dl today.  Last HgA1c was 7.2%. ? ?New problem(s): None.  ? ?PCP is Sarina Ser, MD , and last visit was March, 2023. ? ?Allergies  ?Allergen Reactions  ? Sunflower Oil Anaphylaxis  ?  Swelling of throat ?Swelling of throat ?Swelling of throat-any sunflower product ?Swelling of throat-any sunflower product ?  ? Bupropion Other (See Comments)  ?  Tremors  ? Glucophage [Metformin Hcl] Diarrhea  ? ? ?Review of Systems: Negative except as noted in the HPI. ? ?Objective: No changes noted in today's physical examination. ? ?Constitutional George Boyer is a pleasant 71 y.o. Caucasian male, morbidly obese in NAD. AAO x 3.   ?Vascular CFT immediate b/l LE. Palpable DP/PT pulses b/l LE. Digital hair sparse b/l. Skin temperature gradient WNL b/l. No pain with calf compression b/l. No edema noted b/l. No cyanosis or clubbing noted b/l LE.  ?Neurologic Normal speech. Oriented to person, place, and time. Protective sensation intact 5/5 intact bilaterally with 10g monofilament b/l. Vibratory sensation intact b/l.  ?Dermatologic Pedal skin is warm and supple b/l LE. No open wounds b/l LE. No interdigital macerations noted b/l LE. Toenails bilateral great toes, R 2nd toe, and R 5th toe elongated, discolored, dystrophic, thickened, and crumbly with subungual debris and tenderness to dorsal palpation. Nondystrophic toenails 2-5 left, R  3rd toe, and R 4th toe. Hyperkeratotic lesion(s) b/l hallux, submet head 1 right foot and submet head 5 right foot.  No erythema, no edema, no drainage, no fluctuance.  ?Orthopedic: Muscle strength 5/5 to all lower extremity muscle groups bilaterally. HAV with bunion deformity noted b/l LE. Hallux hammertoe noted b/l LE. Hammertoe deformity noted 2-5 b/l.  ? ?Radiographs: None ? ?Assessment/Plan: ?1. Pain due to onychomycosis of toenails of both feet   ?2. Callus   ?3. Diabetes mellitus due to underlying condition with stage 3 chronic kidney disease, with long-term current use of insulin, unspecified whether stage 3a or 3b CKD ([place])   ? -Examined patient. ?-Toenails R 2nd toe and R 5th toe debrided in length and girth without iatrogenic bleeding with sterile nail nipper and dremel.  ?-Nondystrophic toenails trimmed 2-5 left foot, R 3rd toe, and R 4th toe. ?-Callus(es) bilateral great toes, submet head 1 right foot, and submet head 5 right foot pared utilizing sterile scalpel blade without complication or incident. Total number debrided =4. ?-Patient/POA to call should there be question/concern in the interim.  ? ?Return in about 9 weeks (around 05/11/2022). ? ?Marzetta Board, DPM  ?

## 2022-06-10 ENCOUNTER — Encounter: Payer: Self-pay | Admitting: Podiatry

## 2022-06-10 ENCOUNTER — Ambulatory Visit (INDEPENDENT_AMBULATORY_CARE_PROVIDER_SITE_OTHER): Payer: Medicare Other | Admitting: Podiatry

## 2022-06-10 DIAGNOSIS — Z794 Long term (current) use of insulin: Secondary | ICD-10-CM

## 2022-06-10 DIAGNOSIS — M79675 Pain in left toe(s): Secondary | ICD-10-CM | POA: Diagnosis not present

## 2022-06-10 DIAGNOSIS — L84 Corns and callosities: Secondary | ICD-10-CM

## 2022-06-10 DIAGNOSIS — B351 Tinea unguium: Secondary | ICD-10-CM

## 2022-06-10 DIAGNOSIS — E0822 Diabetes mellitus due to underlying condition with diabetic chronic kidney disease: Secondary | ICD-10-CM

## 2022-06-10 DIAGNOSIS — N183 Chronic kidney disease, stage 3 unspecified: Secondary | ICD-10-CM

## 2022-06-10 DIAGNOSIS — M79674 Pain in right toe(s): Secondary | ICD-10-CM

## 2022-06-18 ENCOUNTER — Emergency Department (HOSPITAL_COMMUNITY): Payer: Medicare Other

## 2022-06-18 ENCOUNTER — Other Ambulatory Visit: Payer: Self-pay

## 2022-06-18 ENCOUNTER — Emergency Department (HOSPITAL_COMMUNITY)
Admission: EM | Admit: 2022-06-18 | Discharge: 2022-06-19 | Disposition: A | Discharge status: Home / Self Care | Payer: Medicare Other | Attending: Emergency Medicine | Admitting: Emergency Medicine

## 2022-06-18 DIAGNOSIS — J45909 Unspecified asthma, uncomplicated: Secondary | ICD-10-CM | POA: Diagnosis not present

## 2022-06-18 DIAGNOSIS — I10 Essential (primary) hypertension: Secondary | ICD-10-CM | POA: Diagnosis not present

## 2022-06-18 DIAGNOSIS — Z7951 Long term (current) use of inhaled steroids: Secondary | ICD-10-CM | POA: Insufficient documentation

## 2022-06-18 DIAGNOSIS — Z7984 Long term (current) use of oral hypoglycemic drugs: Secondary | ICD-10-CM | POA: Insufficient documentation

## 2022-06-18 DIAGNOSIS — M25512 Pain in left shoulder: Secondary | ICD-10-CM | POA: Diagnosis not present

## 2022-06-18 DIAGNOSIS — E119 Type 2 diabetes mellitus without complications: Secondary | ICD-10-CM | POA: Insufficient documentation

## 2022-06-18 DIAGNOSIS — S80212A Abrasion, left knee, initial encounter: Secondary | ICD-10-CM | POA: Diagnosis not present

## 2022-06-18 DIAGNOSIS — S0990XA Unspecified injury of head, initial encounter: Secondary | ICD-10-CM | POA: Insufficient documentation

## 2022-06-18 DIAGNOSIS — Z79899 Other long term (current) drug therapy: Secondary | ICD-10-CM | POA: Diagnosis not present

## 2022-06-18 DIAGNOSIS — M25552 Pain in left hip: Secondary | ICD-10-CM | POA: Insufficient documentation

## 2022-06-18 DIAGNOSIS — Y9301 Activity, walking, marching and hiking: Secondary | ICD-10-CM | POA: Diagnosis not present

## 2022-06-18 DIAGNOSIS — S8992XA Unspecified injury of left lower leg, initial encounter: Secondary | ICD-10-CM | POA: Diagnosis present

## 2022-06-18 DIAGNOSIS — Z7901 Long term (current) use of anticoagulants: Secondary | ICD-10-CM | POA: Insufficient documentation

## 2022-06-18 DIAGNOSIS — Z794 Long term (current) use of insulin: Secondary | ICD-10-CM | POA: Diagnosis not present

## 2022-06-18 DIAGNOSIS — R079 Chest pain, unspecified: Secondary | ICD-10-CM | POA: Insufficient documentation

## 2022-06-18 DIAGNOSIS — W108XXA Fall (on) (from) other stairs and steps, initial encounter: Secondary | ICD-10-CM | POA: Diagnosis not present

## 2022-06-18 DIAGNOSIS — I4891 Unspecified atrial fibrillation: Secondary | ICD-10-CM | POA: Insufficient documentation

## 2022-06-18 DIAGNOSIS — W19XXXA Unspecified fall, initial encounter: Secondary | ICD-10-CM

## 2022-06-18 MED ORDER — OXYCODONE-ACETAMINOPHEN 5-325 MG PO TABS
1.0000 | ORAL_TABLET | Freq: Once | ORAL | Status: AC
  Administered 2022-06-19: 1 via ORAL
  Filled 2022-06-18: qty 1

## 2022-06-18 NOTE — ED Provider Notes (Signed)
[facility] EMERGENCY DEPARTMENT Provider Note   CSN: [id number] Arrival date & time: 06/18/22  2216     History {Add pertinent medical, surgical, social history, OB history to HPI:1} Chief Complaint  Patient presents with   George Boyer is a 71 y.o. male with history of diabetes mellitus, hypertension, asthma, and atrial fibrillation on Eliquis who presents to the emergency department status post mechanical fall just prior to arrival. Patient reports he was walking down some wet steps outside of his daughters house when he slipped on the last step leading to a fall. Reports he landed on his left shoulder/chest, denies head injury or LOC. Having pain to the left shoulder, chest, hip, and knee. Worse with movement & when he attempted to weight bearing. Denies any prodromal sxs prior to fall. Denies headache, neck pain, abdominal pain, dyspnea, numbness, tingling or weakness.   HPI     Home Medications Prior to Admission medications   Medication Sig Start Date End Date Taking? Authorizing Provider  ACCU-CHEK AVIVA PLUS test strip USE TO TEST BLOOD SUGARS FOUR TIMES DAILY DX E11.9 AND Z79.4 09/03/18   [provider]  Accu-Chek FastClix Lancets MISC Use to monitor blood glucose 4 time(s) daily DX CODE E11.9 and Z79.4 02/08/19   [provider]  amiodarone (PACERONE) 200 MG tablet Take 200 mg by mouth daily.    [provider]  amiodarone (PACERONE) 200 MG tablet Take by mouth.    [provider]  amLODipine (NORVASC) 5 MG tablet TK 1 T PO QAM 06/23/18   [provider]  apixaban (ELIQUIS) 5 MG TABS tablet Take 5 mg by mouth 2 (two) times daily.    [provider]  atorvastatin (LIPITOR) 40 MG tablet Take 40 mg by mouth daily.    [provider]  benzonatate (TESSALON) 100 MG capsule TK ONE C PO TID FOR 10 DAYS 07/16/18   [provider]  buPROPion (WELLBUTRIN XL) 150 MG 24 hr tablet TAKE 1  TABLET BY MOUTH EVERY DAY IN THE MORNING 07/05/18   [provider]  cholecalciferol (VITAMIN D) 1000 UNITS tablet Take 2,000 Units by mouth daily.    [provider]  cyanocobalamin (,VITAMIN B-12,) 1000 MCG/ML injection INJECT 1 ML IN THE MUSCLE EVERY 30 DAYS 11/12/18   [provider]  cyclobenzaprine (FLEXERIL) 10 MG tablet Take 1 tablet (10 mg total) by mouth 2 (two) times daily as needed for muscle spasms. 02/19/13   Nelva Nay, MD  donepezil (ARICEPT) 5 MG tablet Take by mouth. 02/09/22   [provider]  FARXIGA 5 MG TABS tablet Take 5 mg by mouth daily. 06/07/22   [provider]  fluticasone (FLOVENT HFA) 220 MCG/ACT inhaler Inhale into the lungs. 12/20/16   [provider]  Folic Acid-Cholecalciferol 01-7105 MG-UNIT TABS Take by mouth.    [provider]  furosemide (LASIX) 40 MG tablet Take 40 mg by mouth daily.    [provider]  HYDROcodone-acetaminophen (NORCO/VICODIN) 5-325 MG per tablet Take 1-2 tablets by mouth every 6 (six) hours as needed for pain. 02/21/13   Pollyann Savoy, MD  hydrocortisone 2.5 % ointment APP TOPICALLY AA BID FOR 7 DAYS 06/28/18   [provider]  Insulin Glargine, 1 Unit Dial, 300 UNIT/ML SOPN Inject 60 units under the skin once daily. 08/01/19   [provider]  insulin NPH (HUMULIN N,NOVOLIN N) 100 UNIT/ML injection Inject 40 Units into the skin 2 (  two) times daily. 30 units in the morning.  40-50 units in the evening.    [provider]  insulin regular (HUMULIN R) 100 units/mL injection Inject 20 Units into the skin 3 (three) times daily before meals. 20 unit morning, 40 units lunch, and 30 units in the evening.    [provider]  Insulin Syringe-Needle U-100 (INSULIN SYRINGE 1CC/31GX5/16") 31G X 5/16" 1 ML MISC Use with insulin five (5) times daily  DX E11.42 and Z79.4 02/08/19   [provider]  irbesartan (AVAPRO) 150 MG tablet Take 150 mg by  mouth daily.    [provider]  isosorbide mononitrate (IMDUR) 30 MG 24 hr tablet Take by mouth. 05/04/20   [provider]  nitroGLYCERIN (NITROSTAT) 0.4 MG SL tablet Place under the tongue. 08/05/13   [provider]  pravastatin (PRAVACHOL) 20 MG tablet TK 1 T PO HS 09/07/18   [provider]  predniSONE (DELTASONE) 20 MG tablet Take 2 tablets (40 mg total) by mouth daily. 02/21/13   Truddie Hidden, MD  ranolazine (RANEXA) 500 MG 12 hr tablet TK 1 T PO BID 09/13/18   [provider]  TOUJEO SOLOSTAR 300 UNIT/ML SOPN ADM 60 UNI [place] QD 08/01/19   [provider]  TRADJENTA 5 MG TABS tablet  08/01/19   [provider]  Vitamin D, Ergocalciferol, (DRISDOL) 50000 units CAPS capsule TK ONE C PO TWICE WEEKLY 09/07/18   [provider]      Allergies    Sunflower oil, Bupropion, and Glucophage [metformin hcl]    Review of Systems   Review of Systems  Constitutional:  Negative for chills and fever.  Respiratory:  Negative for shortness of breath.   Cardiovascular:  Positive for chest pain.  Gastrointestinal:  Negative for abdominal pain and vomiting.  Musculoskeletal:  Positive for arthralgias. Negative for neck pain.  Neurological:  Negative for syncope, weakness and numbness.  All other systems reviewed and are negative.   Physical Exam Updated Vital Signs BP 108/71   Pulse 63   Temp 97.8 F (36.6 C) (Oral)   Resp (!) 22   SpO2 97%  Physical Exam Vitals and nursing note reviewed.  Constitutional:      General: He is not in acute distress.    Appearance: He is not ill-appearing or toxic-appearing.  HENT:     Head: Normocephalic and atraumatic. No raccoon eyes or Battle's sign.  Eyes:     Extraocular Movements: Extraocular movements intact.     Pupils: Pupils are equal, round, and reactive to light.  Cardiovascular:     Rate and Rhythm: Normal rate and regular rhythm.     Pulses:          Radial pulses are  2+ on the right side and 2+ on the left side.       Dorsalis pedis pulses are 2+ on the right side and 2+ on the left side.  Pulmonary:     Effort: Pulmonary effort is normal.     Breath sounds: Normal breath sounds.  Chest:     Chest wall: Tenderness (Left anterior chest wall without overlying skin changes or palpable crepitus.) present.  Abdominal:     General: There is no distension.     Palpations: Abdomen is soft.     Tenderness: There is no abdominal tenderness. There is no guarding or rebound.  Musculoskeletal:     Cervical back: Normal range of motion and neck supple. No spinous process tenderness.  Comments: Upper extremities: No obvious deformities.  Actively ranging at the shoulders, elbows, wrist, and all digits.  Tender to palpation to the left glenohumeral joint Back: No midline spinal tenderness to palpation Lower extremities: Able to lift bilateral lower extremities off of the stretcher, full flexion and extension of the knees, able to plantar and dorsiflex the ankles and move the digits.  Tender palpation to the left posterior hip and to the left anterior knee.  Small abrasion over the left anterior knee.  Otherwise no significant tenderness.  Compartments are soft.    Neurological:     Mental Status: He is alert.     ED Results / Procedures / Treatments   Labs (all labs ordered are listed, but only abnormal results are displayed) Labs Reviewed - No data to display  EKG None  Radiology No results found.  Procedures Procedures  {Document cardiac monitor, telemetry assessment procedure when appropriate:1}  Medications Ordered in ED Medications - No data to display  ED Course/ Medical Decision Making/ A&P                           Medical Decision Making Amount and/or Complexity of Data Reviewed Radiology: ordered.   ***  {Document critical care time when appropriate:1} {Document review of labs and clinical decision tools ie heart score, Chads2Vasc2  etc:1}  {Document your independent review of radiology images, and any outside records:1} {Document your discussion with family members, caretakers, and with consultants:1} {Document social determinants of health affecting pt's care:1} {Document your decision making why or why not admission, treatments were needed:1} Final Clinical Impression(s) / ED Diagnoses Final diagnoses:  None    Rx / DC Orders ED Discharge Orders     None

## 2022-06-18 NOTE — ED Notes (Signed)
Patient transported to X-ray 

## 2022-06-18 NOTE — Progress Notes (Signed)
  Subjective:  [name]: George Boyer, male    DOB: 14-Apr-1951,  MRN: [id number]  George Boyer presents to clinic today for at risk foot care. Pt has h/o NIDDM with chronic kidney disease and callus(es) b/l lower extremities and painful thick toenails that are difficult to trim. Painful toenails interfere with ambulation. Aggravating factors include wearing enclosed shoe gear. Pain is relieved with periodic professional debridement. Painful calluses are aggravated when weightbearing with and without shoegear. Pain is relieved with periodic professional debridement.  Patient states blood glucose was 123 mg/dl today.    Last A1c was 7.4%.  PCP is [name], MD , and last visit was  Apr 11, 2022  Allergies  Allergen Reactions   Sunflower Oil Anaphylaxis    Swelling of throat Swelling of throat Swelling of throat-any sunflower product Swelling of throat-any sunflower product    Bupropion Other (See Comments)    Tremors   Glucophage [Metformin Hcl] Diarrhea    Review of Systems: Negative except as noted in the HPI.  Objective: No changes noted in today's physical examination. Constitutional George Boyer is a pleasant 71 y.o. Caucasian male, morbidly obese in NAD. AAO x 3.   Vascular CFT immediate b/l LE. Palpable DP/PT pulses b/l LE. Digital hair sparse b/l. Skin temperature gradient WNL b/l. No pain with calf compression b/l. No edema noted b/l. No cyanosis or clubbing noted b/l LE.  Neurologic Normal speech. Oriented to person, place, and time. Protective sensation intact 5/5 intact bilaterally with 10g monofilament b/l. Vibratory sensation intact b/l.  Dermatologic Pedal skin is warm and supple b/l LE. No open wounds b/l LE. No interdigital macerations noted b/l LE. Toenails bilateral great toes, R 2nd toe, and R 5th toe elongated, discolored, dystrophic, thickened, and crumbly with subungual debris and tenderness to dorsal palpation. Nondystrophic toenails 2-5 left, R 3rd toe, and  R 4th toe. Hyperkeratotic lesion(s) b/l hallux, submet head 1 right foot and submet head 5 right foot.  No erythema, no edema, no drainage, no fluctuance.  Orthopedic: Muscle strength 5/5 to all lower extremity muscle groups bilaterally. HAV with bunion deformity noted b/l LE. Hallux hammertoe noted b/l LE. Hammertoe deformity noted 2-5 b/l.   Radiographs: None  Assessment/Plan: 1. Pain due to onychomycosis of toenails of both feet   2. Callus   3. Diabetes mellitus due to underlying condition with stage 3 chronic kidney disease, with long-term current use of insulin, unspecified whether stage 3a or 3b CKD (HCC)      -Patient was evaluated and treated. All patient's and/or POA's questions/concerns answered on today's visit. -Toenails bilateral great toes, R 2nd toe, and R 5th toe debrided in length and girth without iatrogenic bleeding with sterile nail nipper and dremel.  -Nondystrophic toenails trimmed 2-5 left foot, R 3rd toe, and R 4th toe. -Callus(es) bilateral great toes, submet head 1 right foot, and submet head 5 right foot pared utilizing sterile scalpel blade without complication or incident. Total number debrided =4. -Patient/POA to call should there be question/concern in the interim.   Return in about 3 months (around 09/10/2022).  Freddie Breech, DPM

## 2022-06-18 NOTE — ED Triage Notes (Signed)
Pt here from home via [facility] for fall. Pt was walking down steps to go outside and slipped, then slipped on the grass and landed on L side on a tree stump. Pt denies hitting head, he is on eliquis. Pt c/o L shoulder pain, L lower back pain and L knee pain. Aox4, VSS

## 2022-06-19 MED ORDER — LIDOCAINE 5 % EX PTCH: 1.0000 | MEDICATED_PATCH | Freq: Every day | CUTANEOUS | 0 refills | Status: DC | PRN

## 2022-06-19 NOTE — Progress Notes (Signed)
Orthopedic Tech Progress Note Patient Details:  George Boyer October 08, 1951 [id number]  Ortho Devices Type of Ortho Device: Knee Immobilizer Ortho Device/Splint Location: lle Ortho Device/Splint Interventions: Ordered, Application, Adjustment   Post Interventions Patient Tolerated: Well Instructions Provided: Care of device, Adjustment of device  Trinna Post 06/19/2022, 12:56 AM

## 2022-06-19 NOTE — Discharge Instructions (Addendum)
You were seen in the ER today for a fall.  Please read and follow all provided instructions.  Tests performed today include: CT head without bleed CT neck: Degenerative changes but no fracture Chest x-ray: Mild heart enlargement however no fracture or collapsed lung Left shoulder and hip x-rays with some degenerative changes but no fractures Left knee x-ray with some degenerative changes as well as a small amount of fluid however no fractures or dislocations  Medications prescribed:   You make take Tylenol per over the counter dosing with these medications.  We are also sending you home with Lidoderm patches, apply 1 patch.  With significant pain once per day, remove and discard patch within 12 hours of application, do not apply heat over the patch  We have prescribed you new medication(s) today. Discuss the medications prescribed today with your pharmacist as they can have adverse effects and interactions with your other medicines including over the counter and prescribed medications. Seek medical evaluation if you start to experience new or abnormal symptoms after taking one of these medicines, seek care immediately if you start to experience difficulty breathing, feeling of your throat closing, facial swelling, or rash as these could be indications of a more serious allergic reaction   Home care instructions:  Please rest, keep your left knee elevated, apply ice wrapped in a towel 20 minutes on 40 minutes off to the knee, and utilize the knee immobilizer for compression and stability.  Please follow-up with orthopedics in regards to your knee injury.  Please follow-up with primary care and general for your fall. Return instructions:  Please return to the Emergency Department if you experience worsening symptoms.  You have numbness, tingling, or weakness in the arms or legs.  You develop severe headaches not relieved with medicine.  You have severe neck pain, especially tenderness in the  middle of the back of your neck.  You have vision or hearing changes If you develop confusion You have changes in bowel or bladder control.  There is increasing pain in any area of the body.  You have shortness of breath, lightheadedness, dizziness, or fainting.  You have chest pain.  You feel sick to your stomach (nauseous), or throw up (vomit).  You have increasing abdominal discomfort.  There is blood in your urine, stool, or vomit.  You have pain in your shoulder (shoulder strap areas).  You feel your symptoms are getting worse or if you have any other emergent concerns  Additional Information:  Your vital signs today were: Blood pressure (!) 136/43, pulse 80, temperature 97.8 F (36.6 C), temperature source Oral, resp. rate (!) 25, SpO2 97 %.   If your blood pressure (BP) was elevated above 135/85 this visit, please have this repeated by your doctor within one month -----------------------------------------------------

## 2022-06-19 NOTE — ED Notes (Signed)
Ortho tech at bedside 

## 2022-09-16 ENCOUNTER — Ambulatory Visit (INDEPENDENT_AMBULATORY_CARE_PROVIDER_SITE_OTHER): Payer: Medicare Other | Admitting: Podiatry

## 2022-09-16 DIAGNOSIS — E0822 Diabetes mellitus due to underlying condition with diabetic chronic kidney disease: Secondary | ICD-10-CM | POA: Diagnosis not present

## 2022-09-16 DIAGNOSIS — M79674 Pain in right toe(s): Secondary | ICD-10-CM | POA: Diagnosis not present

## 2022-09-16 DIAGNOSIS — L84 Corns and callosities: Secondary | ICD-10-CM

## 2022-09-16 DIAGNOSIS — N183 Chronic kidney disease, stage 3 unspecified: Secondary | ICD-10-CM | POA: Diagnosis not present

## 2022-09-16 DIAGNOSIS — B351 Tinea unguium: Secondary | ICD-10-CM | POA: Diagnosis not present

## 2022-09-16 DIAGNOSIS — M2011 Hallux valgus (acquired), right foot: Secondary | ICD-10-CM

## 2022-09-16 DIAGNOSIS — M2012 Hallux valgus (acquired), left foot: Secondary | ICD-10-CM

## 2022-09-16 DIAGNOSIS — E119 Type 2 diabetes mellitus without complications: Secondary | ICD-10-CM

## 2022-09-16 DIAGNOSIS — M216X1 Other acquired deformities of right foot: Secondary | ICD-10-CM | POA: Diagnosis not present

## 2022-09-16 DIAGNOSIS — E1142 Type 2 diabetes mellitus with diabetic polyneuropathy: Secondary | ICD-10-CM | POA: Diagnosis not present

## 2022-09-16 DIAGNOSIS — M79675 Pain in left toe(s): Secondary | ICD-10-CM | POA: Diagnosis not present

## 2022-09-16 DIAGNOSIS — Z794 Long term (current) use of insulin: Secondary | ICD-10-CM

## 2022-09-16 DIAGNOSIS — M216X2 Other acquired deformities of left foot: Secondary | ICD-10-CM

## 2022-09-16 NOTE — Progress Notes (Unsigned)
ANNUAL DIABETIC FOOT EXAM  Subjective: George Boyer presents today for annual diabetic foot examination.  Chief Complaint  Patient presents with   Nail Problem    Thick painful toenails, 3 month follow up    Patient confirms h/o diabetes.  Patient relates {Numbers; 0-100:15068} year h/o diabetes.  Patient denies any h/o foot wounds.  Patient has h/o foot ulcer of {jgPodToeLocator:23637}, which healed via help of ***.  Patient admits symptoms of foot numbness.   Patient admits symptoms of foot tingling.  Patient admits symptoms of burning in feet.  Patient admits symptoms of pins/needles sensation in feet.  Patient denies any numbness, tingling, burning, or pins/needle sensation in feet.  Patient has been diagnosed with neuropathy and it is managed with {JGNEUROPATHYMEDS:27053}.  Patient's blood sugar was *** mg/dl {Time; today/yesterday/ 2 days ago:19188}. Last known  HgA1c was ***%   Patient did not check blood glucose this morning.  Patient does not monitor blood glucose daily.  Risk factors: diabetes, HTN, CAD, CKD, hyperlipidemia.  [name], MD is patient's PCP. Last visit was {Time; dates multiple:15870}***.  Past Medical History:  Diagnosis Date   Asthma    Atrial fibrillation (HCC)    Coronary artery disease    Diabetes mellitus without complication (HCC)    Hypertension    Patient Active Problem List   Diagnosis Date Noted   Bilateral carotid artery stenosis 01/31/2022   Aortic valve stenosis 01/27/2021   Bruit of right carotid artery 01/27/2021   Cellulitis of right hand 06/04/2020   Erythema of hand 05/15/2020   Diastolic dysfunction 04/08/2020   Anemia due to chronic kidney disease 07/11/2019   CKD stage 3 due to type 2 diabetes mellitus (HCC) 07/11/2019   Microalbuminuria 07/11/2019   SOB (shortness of breath) on exertion 04/19/2017   S/P coronary artery stent placement 05/04/2016   Obstructive sleep apnea 09/29/2015   High risk  medication use 08/21/2014   Body mass index 40.0-44.9, adult (HCC) 08/14/2013   Anxiety and depression 08/15/2012   Arthralgia 08/15/2012   Bradycardia 08/15/2012   CAD in native artery 08/15/2012   Central obesity 08/15/2012   Dependent edema 08/15/2012   Dyslipidemia 08/15/2012   Erectile dysfunction 08/15/2012   HTN (hypertension) 08/15/2012   T2DM (type 2 diabetes mellitus) (HCC) 08/15/2012   Vitamin D deficiency 08/15/2012   Hyperlipidemia LDL goal <100 08/15/2012   Unspecified atrial fibrillation (HCC) 07/13/2012   Long term (current) use of anticoagulants 07/13/2012   Past Surgical History:  Procedure Laterality Date   CORONARY STENT PLACEMENT     TONSILLECTOMY     Current Outpatient Medications on File Prior to Visit  Medication Sig Dispense Refill   ACCU-CHEK AVIVA PLUS test strip USE TO TEST BLOOD SUGARS FOUR TIMES DAILY DX E11.9 AND Z79.4  1   Accu-Chek FastClix Lancets MISC Use to monitor blood glucose 4 time(s) daily DX CODE E11.9 and Z79.4     amiodarone (PACERONE) 200 MG tablet Take 200 mg by mouth daily.     amiodarone (PACERONE) 200 MG tablet Take by mouth.     amLODipine (NORVASC) 5 MG tablet TK 1 T PO QAM  0   apixaban (ELIQUIS) 5 MG TABS tablet Take 5 mg by mouth 2 (two) times daily.     atorvastatin (LIPITOR) 40 MG tablet Take 40 mg by mouth daily.     benzonatate (TESSALON) 100 MG capsule TK ONE C PO TID FOR 10 DAYS  0   buPROPion (WELLBUTRIN XL) 150 MG 24  hr tablet TAKE 1 TABLET BY MOUTH EVERY DAY IN THE MORNING  0   cholecalciferol (VITAMIN D) 1000 UNITS tablet Take 2,000 Units by mouth daily.     cyanocobalamin (,VITAMIN B-12,) 1000 MCG/ML injection INJECT 1 ML IN THE MUSCLE EVERY 30 DAYS     cyclobenzaprine (FLEXERIL) 10 MG tablet Take 1 tablet (10 mg total) by mouth 2 (two) times daily as needed for muscle spasms. 20 tablet 0   donepezil (ARICEPT) 5 MG tablet Take by mouth.     FARXIGA 5 MG TABS tablet Take 5 mg by mouth daily.     fluticasone (FLOVENT  HFA) 220 MCG/ACT inhaler Inhale into the lungs.     Folic Acid-Cholecalciferol 11-4998 MG-UNIT TABS Take by mouth.     furosemide (LASIX) 40 MG tablet Take 40 mg by mouth daily.     HYDROcodone-acetaminophen (NORCO/VICODIN) 5-325 MG per tablet Take 1-2 tablets by mouth every 6 (six) hours as needed for pain. 30 tablet 0   hydrocortisone 2.5 % ointment APP TOPICALLY AA BID FOR 7 DAYS  0   Insulin Glargine, 1 Unit Dial, 300 UNIT/ML SOPN Inject 60 units under the skin once daily.     insulin NPH (HUMULIN N,NOVOLIN N) 100 UNIT/ML injection Inject 40 Units into the skin 2 (two) times daily. 30 units in the morning.  40-50 units in the evening.     insulin regular (HUMULIN R) 100 units/mL injection Inject 20 Units into the skin 3 (three) times daily before meals. 20 unit morning, 40 units lunch, and 30 units in the evening.     Insulin Syringe-Needle U-100 (INSULIN SYRINGE 1CC/31GX5/16") 31G X 5/16" 1 ML MISC Use with insulin five (5) times daily  DX E11.42 and Z79.4     irbesartan (AVAPRO) 150 MG tablet Take 150 mg by mouth daily.     isosorbide mononitrate (IMDUR) 30 MG 24 hr tablet Take by mouth.     lidocaine (LIDODERM) 5 % Place 1 patch onto the skin daily as needed. Apply patch to area most significant pain once per day.  Remove and discard patch within 12 hours of application. 15 patch 0   nitroGLYCERIN (NITROSTAT) 0.4 MG SL tablet Place under the tongue.     pravastatin (PRAVACHOL) 20 MG tablet TK 1 T PO HS  0   predniSONE (DELTASONE) 20 MG tablet Take 2 tablets (40 mg total) by mouth daily. 10 tablet 0   ranolazine (RANEXA) 500 MG 12 hr tablet TK 1 T PO BID  11   TOUJEO SOLOSTAR 300 UNIT/ML SOPN ADM 60 UNI [place] QD     TRADJENTA 5 MG TABS tablet      Vitamin D, Ergocalciferol, (DRISDOL) 50000 units CAPS capsule TK ONE C PO TWICE WEEKLY  0   No current facility-administered medications on file prior to visit.    Allergies  Allergen Reactions   Sunflower Oil Anaphylaxis    Swelling of  throat Swelling of throat Swelling of throat-any sunflower product Swelling of throat-any sunflower product    Bupropion Other (See Comments)    Tremors   Glucophage [Metformin Hcl] Diarrhea   Social History   Occupational History   Not on file  Tobacco Use   Smoking status: Never   Smokeless tobacco: Never  Substance and Sexual Activity   Alcohol use: No   Drug use: No   Sexual activity: Not on file   No family history on file. Immunization History  Administered Date(s) Administered   Influenza,inj,quad, With Preservative 08/17/2018  PFIZER(Purple Top)SARS-COV-2 Vaccination 12/10/2019, 12/31/2019   Pneumococcal Polysaccharide-23 07/09/2019     Review of Systems: Negative except as noted in the HPI.   Objective: There were no vitals filed for this visit.  JIE STICKELS is a pleasant 71 y.o. male in NAD. AAO X 3.  Vascular Examination: Capillary refill time immediate b/l.Vascular status intact b/l with palpable pedal pulses. Pedal hair present b/l. No edema. No pain with calf compression b/l. Skin temperature gradient WNL b/l. {jgvascular:23595}  Neurological Examination: Sensation grossly intact b/l with 10 gram monofilament. Vibratory sensation intact b/l. {jgneuro:23601::"Protective sensation intact 5/5 intact bilaterally with 10g monofilament b/l.","Vibratory sensation intact b/l.","Proprioception intact bilaterally."}  Dermatological Examination: Pedal skin with normal turgor, texture and tone b/l. Toenails 1-5 b/l thick, discolored, elongated with subungual debris and pain on dorsal palpation. {jgderm:23598}  Musculoskeletal Examination: {jgmsk:23600}  Radiographs: None  {jgxrayfindings:23683}  Footwear Assessment: Does the patient wear appropriate shoes? Yes. Does the patient need inserts/orthotics? {Yes,No}.  ADA Risk Categorization: Low Risk :  Patient has all of the following: Intact protective sensation No prior foot ulcer  No severe  deformity Pedal pulses present  High Risk  Patient has one or more of the following: Loss of protective sensation Absent pedal pulses Severe Foot deformity History of foot ulcer  Assessment: 1. Pain due to onychomycosis of toenails of both feet   2. Callus   3. Hallux valgus, acquired, bilateral   4. Acquired equinus deformity of both feet   5. Diabetes mellitus due to underlying condition with stage 3 chronic kidney disease, with long-term current use of insulin, unspecified whether stage 3a or 3b CKD (HCC)   6. Encounter for diabetic foot exam [facility])     Plan: -Patient was evaluated and treated. All patient's and/or POA's questions/concerns answered on today's visit. -Diabetic foot examination performed today. -Continue diabetic foot care principles: inspect feet daily, monitor glucose as recommended by PCP and/or Endocrinologist, and follow prescribed diet per PCP, Endocrinologist and/or dietician. -Mycotic toenails 1-5 bilaterally were debrided in length and girth with sterile nail nippers and dremel without incident. -Patient/POA to call should there be question/concern in the interim. Return in about 3 months (around 12/17/2022).  Freddie Breech, DPM

## 2022-09-20 ENCOUNTER — Encounter: Payer: Self-pay | Admitting: Podiatry

## 2022-11-09 LAB — COLOGUARD: COLOGUARD: NEGATIVE

## 2022-11-24 ENCOUNTER — Ambulatory Visit: Payer: Medicare Other | Admitting: *Deleted

## 2022-11-24 DIAGNOSIS — L84 Corns and callosities: Secondary | ICD-10-CM

## 2022-11-24 DIAGNOSIS — M2012 Hallux valgus (acquired), left foot: Secondary | ICD-10-CM

## 2022-11-24 DIAGNOSIS — E1142 Type 2 diabetes mellitus with diabetic polyneuropathy: Secondary | ICD-10-CM

## 2022-11-24 NOTE — Progress Notes (Signed)
Patient presents to the office today for diabetic shoe and insole measuring.  Patient was measured with brannock device to determine size and width for 1 pair of extra depth shoes and foam casted for 3 pair of insoles.   ABN signed.   Documentation of medical necessity will be sent to patient's treating diabetic doctor to verify and sign.   Patient's diabetic provider: Lilian Coma., MD   Shoes and insoles will be ordered at that time and patient will be notified for an appointment for fitting when they arrive.   Patient shoe selection-   1st   Shoe choice:   [id number] APEX   Shoe size ordered: 9 XW

## 2022-12-21 ENCOUNTER — Encounter: Payer: Self-pay | Admitting: Podiatry

## 2022-12-21 ENCOUNTER — Ambulatory Visit: Payer: Medicare Other | Admitting: Podiatry

## 2022-12-21 ENCOUNTER — Ambulatory Visit (INDEPENDENT_AMBULATORY_CARE_PROVIDER_SITE_OTHER): Payer: Medicare Other | Admitting: Podiatry

## 2022-12-21 VITALS — BP 155/54

## 2022-12-21 DIAGNOSIS — E1142 Type 2 diabetes mellitus with diabetic polyneuropathy: Secondary | ICD-10-CM

## 2022-12-21 DIAGNOSIS — M79675 Pain in left toe(s): Secondary | ICD-10-CM | POA: Diagnosis not present

## 2022-12-21 DIAGNOSIS — M216X1 Other acquired deformities of right foot: Secondary | ICD-10-CM

## 2022-12-21 DIAGNOSIS — L84 Corns and callosities: Secondary | ICD-10-CM

## 2022-12-21 DIAGNOSIS — M79674 Pain in right toe(s): Secondary | ICD-10-CM

## 2022-12-21 DIAGNOSIS — B351 Tinea unguium: Secondary | ICD-10-CM | POA: Diagnosis not present

## 2022-12-21 DIAGNOSIS — M2011 Hallux valgus (acquired), right foot: Secondary | ICD-10-CM

## 2022-12-21 NOTE — Progress Notes (Signed)
  Subjective:  [name]: George Boyer, male    DOB: 09-19-1951,  MRN: [id number]  George Boyer presents to clinic today for at risk foot care with history of diabetic neuropathy and callus(es) right lower extremity and painful thick toenails that are difficult to trim. Painful toenails interfere with ambulation. Aggravating factors include wearing enclosed shoe gear. Pain is relieved with periodic professional debridement. Painful calluses are aggravated when weightbearing with and without shoegear. Pain is relieved with periodic professional debridement.  Chief Complaint  Patient presents with   Nail Problem     Routine foot care   New problem(s): None.   He is also here to pick up diabetic shoes on today's visit.  PCP is Lilian Coma., MD.  Allergies  Allergen Reactions   Sunflower Oil Anaphylaxis    Swelling of throat Swelling of throat Swelling of throat-any sunflower product Swelling of throat-any sunflower product    Bupropion Other (See Comments)    Tremors   Glucophage [Metformin Hcl] Diarrhea   Semaglutide Other (See Comments)    Review of Systems: Negative except as noted in the HPI.  Objective: No changes noted in today's physical examination. Vitals:   12/21/22 1542  BP: (!) 155/54   George Boyer is a pleasant 72 y.o. male morbidly obese in NAD. AAO x 3.  Vascular Examination: Capillary refill time immediate b/l.Vascular status intact b/l with palpable pedal pulses. Pedal hair present b/l. No edema. No pain with calf compression b/l. Skin temperature gradient WNL b/l. +1 pitting edema left lower extremity.  Neurological Examination: Sensation grossly intact b/l with 10 gram monofilament. Vibratory sensation diminished b/l.   Dermatological Examination: Pedal skin with normal turgor, texture and tone b/l. Toenails 1-5 b/l thick, discolored, elongated with subungual debris and pain on dorsal palpation.   Hyperkeratotic lesion(s) submet head 1 right foot.   No erythema, no edema, no drainage, no fluctuance.  Musculoskeletal Examination: Muscle strength 5/5 to all lower extremity muscle groups bilaterally. Equinus b/l LE. Limited joint ROM to the 1st MPJ right foot. HAV with bunion deformity noted b/l LE. Patient ambulates independent of any assistive aids.  New diabetic shoes are the same style/size as his choice from last year, but inserts are too thick and this prevents him from being able to put his feet in the shoes comfortably.  Radiographs: None  Assessment/Plan: 1. Pain due to onychomycosis of toenails of both feet   2. Callus   3. Diabetic peripheral neuropathy associated with type 2 diabetes mellitus ([place])     -Consent given for treatment as described below: -Examined patient. -New diabetic inserts are too thick and both shoes/inserts will be sent back to vendor since right insert is customized offloading submet head 1 right foot. We will call him when we receive the corrected items. -Continue diabetic shoes daily. -Toenails 1-5 b/l were debrided in length and girth with sterile nail nippers and dremel without iatrogenic bleeding.  -Callus(es) submet head 1 right foot pared utilizing sterile scalpel blade without complication or incident. Total number debrided =1. -Patient/POA to call should there be question/concern in the interim.   Return in about 3 months (around 03/21/2023).  Marzetta Board, DPM

## 2023-01-06 ENCOUNTER — Telehealth: Payer: Self-pay | Admitting: Podiatry

## 2023-01-06 NOTE — Telephone Encounter (Signed)
Lmom for patient to call back and set up time to pick up diabetic shoes inserts have come in

## 2023-01-18 ENCOUNTER — Ambulatory Visit (INDEPENDENT_AMBULATORY_CARE_PROVIDER_SITE_OTHER): Payer: Medicare Other

## 2023-01-18 DIAGNOSIS — M2011 Hallux valgus (acquired), right foot: Secondary | ICD-10-CM

## 2023-01-18 DIAGNOSIS — E1142 Type 2 diabetes mellitus with diabetic polyneuropathy: Secondary | ICD-10-CM | POA: Diagnosis not present

## 2023-01-18 DIAGNOSIS — M2012 Hallux valgus (acquired), left foot: Secondary | ICD-10-CM

## 2023-01-18 DIAGNOSIS — M216X2 Other acquired deformities of left foot: Secondary | ICD-10-CM

## 2023-01-18 DIAGNOSIS — M216X1 Other acquired deformities of right foot: Secondary | ICD-10-CM

## 2023-01-18 NOTE — Progress Notes (Signed)
Patient presents today to pick up diabetic shoes and insoles.  Patient was dispensed 1 pair of diabetic shoes and 3 pairs of foam casted diabetic insoles.   He tried on the shoes with the insoles and the fit was satisfactory.   Will follow up next year for new order.

## 2023-04-05 ENCOUNTER — Ambulatory Visit: Payer: Medicare Other | Admitting: Podiatry

## 2023-04-05 ENCOUNTER — Encounter: Payer: Self-pay | Admitting: Podiatry

## 2023-04-05 DIAGNOSIS — L84 Corns and callosities: Secondary | ICD-10-CM

## 2023-04-05 DIAGNOSIS — M79674 Pain in right toe(s): Secondary | ICD-10-CM | POA: Diagnosis not present

## 2023-04-05 DIAGNOSIS — B351 Tinea unguium: Secondary | ICD-10-CM

## 2023-04-05 DIAGNOSIS — M79675 Pain in left toe(s): Secondary | ICD-10-CM | POA: Diagnosis not present

## 2023-04-05 DIAGNOSIS — E1142 Type 2 diabetes mellitus with diabetic polyneuropathy: Secondary | ICD-10-CM

## 2023-04-05 NOTE — Progress Notes (Signed)
  Subjective:  [name]: KURTIS CORD, male    DOB: 1951-07-29,  MRN: [id number]  Erenest Blank presents to clinic today for at risk foot care with history of diabetic neuropathy and painful elongated mycotic toenails 1-5 bilaterally which are tender when wearing enclosed shoe gear. Pain is relieved with periodic professional debridement. He has received his corrected diabetic inserts. Chief Complaint  Patient presents with   Nail Problem    DFC BS-92 A1C-7.7 PCP-Jobe PCP VST-"Within the last month"   New problem(s): None.   PCP is [name], MD.  Allergies  Allergen Reactions   Sunflower Oil Anaphylaxis    Swelling of throat Swelling of throat Swelling of throat-any sunflower product Swelling of throat-any sunflower product    Bupropion Other (See Comments)    Tremors   Glucophage [Metformin Hcl] Diarrhea   Semaglutide Other (See Comments)    Review of Systems: Negative except as noted in the HPI.  Objective: No changes noted in today's physical examination. There were no vitals filed for this visit. ADREON WYKES is a pleasant 72 y.o. male morbidly obese in NAD. AAO x 3.  Vascular Examination: Capillary refill time immediate b/l.Vascular status intact b/l with palpable pedal pulses. Pedal hair present b/l. No edema. No pain with calf compression b/l. Skin temperature gradient WNL b/l. +1 pitting edema left lower extremity.  Neurological Examination: Sensation grossly intact b/l with 10 gram monofilament. Vibratory sensation diminished b/l.   Dermatological Examination: Pedal skin with normal turgor, texture and tone b/l. Toenails 1-5 b/l thick, discolored, elongated with subungual debris and pain on dorsal palpation.   Hyperkeratotic lesion(s) submet head 1 right foot.  No erythema, no edema, no drainage, no fluctuance.  Musculoskeletal Examination: Muscle strength 5/5 to all lower extremity muscle groups bilaterally. Equinus b/l LE. Limited joint ROM to the  1st MPJ right foot. HAV with bunion deformity noted b/l LE. Patient ambulates independent of any assistive aids.  Radiographs: None  Assessment/Plan: 1. Pain due to onychomycosis of toenails of both feet   2. Callus   3. Diabetic peripheral neuropathy associated with type 2 diabetes mellitus (HCC)   -Patient was evaluated and treated. All patient's and/or POA's questions/concerns answered on today's visit. -Patient has received corrected diabetic inserts. -Mycotic toenails 1-5 bilaterally were debrided in length and girth with sterile nail nippers and dremel without incident. -Callus(es) submet head 1 right foot pared utilizing sharp debridement with sterile blade without complication or incident. Total number debrided =1. -Patient/POA to call should there be question/concern in the interim.   Return in about 3 months (around 07/06/2023).  Freddie Breech, DPM

## 2023-04-08 ENCOUNTER — Encounter: Payer: Self-pay | Admitting: Podiatry

## 2023-06-13 DIAGNOSIS — G3184 Mild cognitive impairment, so stated: Secondary | ICD-10-CM | POA: Insufficient documentation

## 2023-07-10 ENCOUNTER — Ambulatory Visit (INDEPENDENT_AMBULATORY_CARE_PROVIDER_SITE_OTHER): Payer: Medicare Other | Admitting: Podiatry

## 2023-07-10 DIAGNOSIS — L84 Corns and callosities: Secondary | ICD-10-CM

## 2023-07-10 DIAGNOSIS — E1151 Type 2 diabetes mellitus with diabetic peripheral angiopathy without gangrene: Secondary | ICD-10-CM | POA: Diagnosis not present

## 2023-07-10 DIAGNOSIS — B351 Tinea unguium: Secondary | ICD-10-CM

## 2023-07-10 NOTE — Progress Notes (Signed)
       Subjective:  [name]: George Boyer, male    DOB: 02/12/1951,  MRN: [id number]  George Boyer presents to clinic today for:  Chief Complaint  Patient presents with   Diabetes    Diabetic foot care-  LBS 105 this am.  Saw Nyra Jabs, PA  at [facility] practice in [place] on 06/13/23 - He is seeing the new MD there in October.   Presents for routine/ diabetic foot care. submet 1 right has a callus.  No other concerns   . Patient notes nails are thick and elongated, causing pain in shoe gear when ambulating.  Patient also has a painful callus right submet 1.  He is wearing his new diabetic shoes today.  There is some redness to the right fifth toe and states that he feels it from breaking on his new shoes.  Denies any pain to the area.  PCP is [name], MD. last seen on 03/13/2023  Past Medical History:  Diagnosis Date   Asthma    Atrial fibrillation (HCC)    Coronary artery disease    Diabetes mellitus without complication (HCC)    Hypertension     Allergies  Allergen Reactions   Sunflower Oil Anaphylaxis    Swelling of throat Swelling of throat Swelling of throat-any sunflower product Swelling of throat-any sunflower product    Bupropion Other (See Comments)    Tremors   Glucophage [Metformin Hcl] Diarrhea   Semaglutide Other (See Comments)    Review of Systems: Negative except as noted in the HPI.  Objective:  There were no vitals filed for this visit.  George Boyer is a pleasant 72 y.o. male in NAD. AAO x 3.  Vascular Examination: Patient has palpable DP pulse, absent PT pulse bilateral.  Delayed capillary refill bilateral toes.  Sparse digital hair bilateral.  Proximal to distal cooling WNL bilateral.    Dermatological Examination: Interspaces are clear with no open lesions noted bilateral.  Skin is shiny and atrophic bilateral.  Nails are 3-1mm thick, with yellowish/brown discoloration, subungual debris and distal onycholysis x10.  There is  pain with compression of nails x10.  There are hyperkeratotic lesions noted right submet 1 .  Patient qualifies for at-risk foot care because of diabetes with PVD.  Assessment/Plan: 1. Dermatophytosis of nail   2. Type II diabetes mellitus with peripheral circulatory disorder (HCC)   3. Callus of foot     Mycotic nails x10 were sharply debrided with sterile nail nippers and power debriding burr to decrease bulk and length.  Hyperkeratotic lesion submet 1 right foot was shaved with #312 blade.   Return in about 3 months (around 10/10/2023) for [facility].   Clerance Lav, DPM, FACFAS [facility]     [address] [place], [place] [address]                Office [phone]  Fax [phone]

## 2023-08-19 NOTE — Progress Notes (Signed)
Seen by casting department

## 2023-09-01 ENCOUNTER — Inpatient Hospital Stay (HOSPITAL_BASED_OUTPATIENT_CLINIC_OR_DEPARTMENT_OTHER)
Admission: EM | Admit: 2023-09-01 | Discharge: 2023-09-04 | DRG: 291 | Disposition: A | Discharge status: Home / Self Care | Payer: Medicare Other | Attending: Internal Medicine | Admitting: Internal Medicine

## 2023-09-01 ENCOUNTER — Emergency Department (HOSPITAL_BASED_OUTPATIENT_CLINIC_OR_DEPARTMENT_OTHER): Payer: Medicare Other

## 2023-09-01 ENCOUNTER — Other Ambulatory Visit: Payer: Self-pay

## 2023-09-01 ENCOUNTER — Encounter (HOSPITAL_BASED_OUTPATIENT_CLINIC_OR_DEPARTMENT_OTHER): Payer: Self-pay

## 2023-09-01 DIAGNOSIS — D509 Iron deficiency anemia, unspecified: Secondary | ICD-10-CM | POA: Diagnosis present

## 2023-09-01 DIAGNOSIS — F32A Depression, unspecified: Secondary | ICD-10-CM | POA: Diagnosis not present

## 2023-09-01 DIAGNOSIS — I130 Hypertensive heart and chronic kidney disease with heart failure and stage 1 through stage 4 chronic kidney disease, or unspecified chronic kidney disease: Principal | ICD-10-CM | POA: Diagnosis present

## 2023-09-01 DIAGNOSIS — Z1152 Encounter for screening for COVID-19: Secondary | ICD-10-CM | POA: Diagnosis not present

## 2023-09-01 DIAGNOSIS — I5033 Acute on chronic diastolic (congestive) heart failure: Secondary | ICD-10-CM | POA: Diagnosis not present

## 2023-09-01 DIAGNOSIS — E8770 Fluid overload, unspecified: Secondary | ICD-10-CM

## 2023-09-01 DIAGNOSIS — E119 Type 2 diabetes mellitus without complications: Secondary | ICD-10-CM

## 2023-09-01 DIAGNOSIS — R0902 Hypoxemia: Secondary | ICD-10-CM | POA: Diagnosis present

## 2023-09-01 DIAGNOSIS — Z79899 Other long term (current) drug therapy: Secondary | ICD-10-CM

## 2023-09-01 DIAGNOSIS — E1122 Type 2 diabetes mellitus with diabetic chronic kidney disease: Secondary | ICD-10-CM | POA: Diagnosis not present

## 2023-09-01 DIAGNOSIS — I5189 Other ill-defined heart diseases: Secondary | ICD-10-CM

## 2023-09-01 DIAGNOSIS — Z6841 Body Mass Index (BMI) 40.0 and over, adult: Secondary | ICD-10-CM | POA: Diagnosis not present

## 2023-09-01 DIAGNOSIS — K8020 Calculus of gallbladder without cholecystitis without obstruction: Secondary | ICD-10-CM | POA: Diagnosis present

## 2023-09-01 DIAGNOSIS — I10 Essential (primary) hypertension: Secondary | ICD-10-CM

## 2023-09-01 DIAGNOSIS — N179 Acute kidney failure, unspecified: Principal | ICD-10-CM

## 2023-09-01 DIAGNOSIS — G4733 Obstructive sleep apnea (adult) (pediatric): Secondary | ICD-10-CM | POA: Diagnosis not present

## 2023-09-01 DIAGNOSIS — N184 Chronic kidney disease, stage 4 (severe): Secondary | ICD-10-CM | POA: Diagnosis present

## 2023-09-01 DIAGNOSIS — I6523 Occlusion and stenosis of bilateral carotid arteries: Secondary | ICD-10-CM | POA: Diagnosis not present

## 2023-09-01 DIAGNOSIS — I480 Paroxysmal atrial fibrillation: Secondary | ICD-10-CM | POA: Diagnosis present

## 2023-09-01 DIAGNOSIS — I2510 Atherosclerotic heart disease of native coronary artery without angina pectoris: Secondary | ICD-10-CM

## 2023-09-01 DIAGNOSIS — E669 Obesity, unspecified: Secondary | ICD-10-CM | POA: Diagnosis present

## 2023-09-01 DIAGNOSIS — N183 Chronic kidney disease, stage 3 unspecified: Secondary | ICD-10-CM | POA: Diagnosis present

## 2023-09-01 DIAGNOSIS — J45909 Unspecified asthma, uncomplicated: Secondary | ICD-10-CM | POA: Diagnosis present

## 2023-09-01 DIAGNOSIS — K5900 Constipation, unspecified: Secondary | ICD-10-CM | POA: Diagnosis not present

## 2023-09-01 DIAGNOSIS — Z7901 Long term (current) use of anticoagulants: Secondary | ICD-10-CM

## 2023-09-01 DIAGNOSIS — D631 Anemia in chronic kidney disease: Secondary | ICD-10-CM | POA: Diagnosis not present

## 2023-09-01 DIAGNOSIS — E785 Hyperlipidemia, unspecified: Secondary | ICD-10-CM | POA: Diagnosis not present

## 2023-09-01 DIAGNOSIS — R001 Bradycardia, unspecified: Secondary | ICD-10-CM

## 2023-09-01 DIAGNOSIS — I350 Nonrheumatic aortic (valve) stenosis: Secondary | ICD-10-CM | POA: Diagnosis not present

## 2023-09-01 DIAGNOSIS — Z794 Long term (current) use of insulin: Secondary | ICD-10-CM

## 2023-09-01 DIAGNOSIS — F419 Anxiety disorder, unspecified: Secondary | ICD-10-CM | POA: Diagnosis not present

## 2023-09-01 DIAGNOSIS — Z7952 Long term (current) use of systemic steroids: Secondary | ICD-10-CM

## 2023-09-01 DIAGNOSIS — D508 Other iron deficiency anemias: Secondary | ICD-10-CM

## 2023-09-01 DIAGNOSIS — I4891 Unspecified atrial fibrillation: Secondary | ICD-10-CM

## 2023-09-01 DIAGNOSIS — J069 Acute upper respiratory infection, unspecified: Secondary | ICD-10-CM | POA: Diagnosis present

## 2023-09-01 DIAGNOSIS — E11649 Type 2 diabetes mellitus with hypoglycemia without coma: Secondary | ICD-10-CM | POA: Diagnosis not present

## 2023-09-01 DIAGNOSIS — N189 Chronic kidney disease, unspecified: Secondary | ICD-10-CM | POA: Diagnosis present

## 2023-09-01 DIAGNOSIS — Z888 Allergy status to other drugs, medicaments and biological substances status: Secondary | ICD-10-CM

## 2023-09-01 DIAGNOSIS — Z91018 Allergy to other foods: Secondary | ICD-10-CM

## 2023-09-01 DIAGNOSIS — Z7984 Long term (current) use of oral hypoglycemic drugs: Secondary | ICD-10-CM

## 2023-09-01 DIAGNOSIS — Z955 Presence of coronary angioplasty implant and graft: Secondary | ICD-10-CM

## 2023-09-01 DIAGNOSIS — N1831 Chronic kidney disease, stage 3a: Secondary | ICD-10-CM

## 2023-09-01 DIAGNOSIS — R935 Abnormal findings on diagnostic imaging of other abdominal regions, including retroperitoneum: Secondary | ICD-10-CM

## 2023-09-01 LAB — CBC
HCT: 27.7 % — ABNORMAL LOW (ref 39.0–52.0)
Hemoglobin: 8.9 g/dL — ABNORMAL LOW (ref 13.0–17.0)
MCH: 26.8 pg (ref 26.0–34.0)
MCHC: 32.1 g/dL (ref 30.0–36.0)
MCV: 83.4 fL (ref 80.0–100.0)
Platelets: 284 10*3/uL (ref 150–400)
RBC: 3.32 MIL/uL — ABNORMAL LOW (ref 4.22–5.81)
RDW: 16.6 % — ABNORMAL HIGH (ref 11.5–15.5)
WBC: 7.4 10*3/uL (ref 4.0–10.5)
nRBC: 0 % (ref 0.0–0.2)

## 2023-09-01 LAB — COMPREHENSIVE METABOLIC PANEL
ALT: 14 U/L (ref 0–44)
AST: 20 U/L (ref 15–41)
Albumin: 3.4 g/dL — ABNORMAL LOW (ref 3.5–5.0)
Alkaline Phosphatase: 68 U/L (ref 38–126)
Anion gap: 7 (ref 5–15)
BUN: 42 mg/dL — ABNORMAL HIGH (ref 8–23)
CO2: 22 mmol/L (ref 22–32)
Calcium: 8.8 mg/dL — ABNORMAL LOW (ref 8.9–10.3)
Chloride: 107 mmol/L (ref 98–111)
Creatinine, Ser: 2.2 mg/dL — ABNORMAL HIGH (ref 0.61–1.24)
GFR, Estimated: 31 mL/min — ABNORMAL LOW (ref >60)
Glucose, Bld: 177 mg/dL — ABNORMAL HIGH (ref 70–99)
Potassium: 5.5 mmol/L — ABNORMAL HIGH (ref 3.5–5.1)
Sodium: 136 mmol/L (ref 135–145)
Total Bilirubin: 0.7 mg/dL (ref 0.3–1.2)
Total Protein: 7.1 g/dL (ref 6.5–8.1)

## 2023-09-01 LAB — URINALYSIS, W/ REFLEX TO CULTURE (INFECTION SUSPECTED)
Bacteria, UA: NONE SEEN
Bilirubin Urine: NEGATIVE
Glucose, UA: 500 mg/dL — AB
Hgb urine dipstick: NEGATIVE
Ketones, ur: NEGATIVE mg/dL
Leukocytes,Ua: NEGATIVE
Nitrite: NEGATIVE
Protein, ur: NEGATIVE mg/dL
Specific Gravity, Urine: 1.008 (ref 1.005–1.030)
pH: 5 (ref 5.0–8.0)

## 2023-09-01 LAB — LACTIC ACID, PLASMA
Lactic Acid, Venous: 2.1 mmol/L (ref 0.5–1.9)
Lactic Acid, Venous: 1.5 mmol/L (ref 0.5–1.9)

## 2023-09-01 LAB — GLUCOSE, CAPILLARY
Glucose-Capillary: 119 mg/dL — ABNORMAL HIGH (ref 70–99)
Glucose-Capillary: 167 mg/dL — ABNORMAL HIGH (ref 70–99)

## 2023-09-01 LAB — TROPONIN I (HIGH SENSITIVITY)
Troponin I (High Sensitivity): 30 ng/L — ABNORMAL HIGH (ref <18)
Troponin I (High Sensitivity): 27 ng/L — ABNORMAL HIGH (ref <18)

## 2023-09-01 LAB — RESP PANEL BY RT-PCR (RSV, FLU A&B, COVID)  RVPGX2
Influenza A by PCR: NEGATIVE
Influenza B by PCR: NEGATIVE
Resp Syncytial Virus by PCR: NEGATIVE
SARS Coronavirus 2 by RT PCR: NEGATIVE

## 2023-09-01 LAB — BRAIN NATRIURETIC PEPTIDE: B Natriuretic Peptide: 500.6 pg/mL — ABNORMAL HIGH (ref 0.0–100.0)

## 2023-09-01 LAB — MAGNESIUM: Magnesium: 2.3 mg/dL (ref 1.7–2.4)

## 2023-09-01 LAB — LIPASE, BLOOD: Lipase: 12 U/L (ref 11–51)

## 2023-09-01 LAB — TSH: TSH: 1.218 u[IU]/mL (ref 0.350–4.500)

## 2023-09-01 MED ORDER — BUPROPION HCL ER (XL) 150 MG PO TB24: 150.0000 mg | ORAL_TABLET | Freq: Every day | ORAL | Status: DC

## 2023-09-01 MED ORDER — INSULIN GLARGINE-YFGN 100 UNIT/ML ~~LOC~~ SOLN
25.0000 [IU] | Freq: Two times a day (BID) | SUBCUTANEOUS | Status: DC
  Administered 2023-09-01 – 2023-09-02 (×2): 25 [IU] via SUBCUTANEOUS
  Filled 2023-09-01 (×4): qty 0.25

## 2023-09-01 MED ORDER — FUROSEMIDE 40 MG PO TABS
40.0000 mg | ORAL_TABLET | Freq: Two times a day (BID) | ORAL | Status: DC
  Administered 2023-09-02: 40 mg via ORAL
  Filled 2023-09-01: qty 1

## 2023-09-01 MED ORDER — ACETAMINOPHEN 325 MG PO TABS: 650.0000 mg | ORAL_TABLET | Freq: Four times a day (QID) | ORAL | Status: DC | PRN

## 2023-09-01 MED ORDER — PRAVASTATIN SODIUM 10 MG PO TABS
20.0000 mg | ORAL_TABLET | Freq: Every day | ORAL | Status: DC
  Administered 2023-09-02 – 2023-09-04 (×3): 20 mg via ORAL
  Filled 2023-09-01 (×3): qty 2

## 2023-09-01 MED ORDER — RANOLAZINE ER 500 MG PO TB12
500.0000 mg | ORAL_TABLET | Freq: Two times a day (BID) | ORAL | Status: DC
  Administered 2023-09-01 – 2023-09-04 (×6): 500 mg via ORAL
  Filled 2023-09-01 (×6): qty 1

## 2023-09-01 MED ORDER — DONEPEZIL HCL 5 MG PO TABS
5.0000 mg | ORAL_TABLET | Freq: Every day | ORAL | Status: DC
  Administered 2023-09-01 – 2023-09-03 (×3): 5 mg via ORAL
  Filled 2023-09-01 (×3): qty 1

## 2023-09-01 MED ORDER — ACETAMINOPHEN 650 MG RE SUPP: 650.0000 mg | Freq: Four times a day (QID) | RECTAL | Status: DC | PRN

## 2023-09-01 MED ORDER — INSULIN ASPART 100 UNIT/ML IJ SOLN: 0.0000 [IU] | Freq: Three times a day (TID) | INTRAMUSCULAR | Status: DC

## 2023-09-01 MED ORDER — FUROSEMIDE 10 MG/ML IJ SOLN
40.0000 mg | Freq: Once | INTRAMUSCULAR | Status: AC
  Administered 2023-09-01: 40 mg via INTRAVENOUS
  Filled 2023-09-01: qty 4

## 2023-09-01 MED ORDER — AMIODARONE HCL 200 MG PO TABS
200.0000 mg | ORAL_TABLET | Freq: Every day | ORAL | Status: DC
  Administered 2023-09-02 – 2023-09-04 (×3): 200 mg via ORAL
  Filled 2023-09-01 (×3): qty 1

## 2023-09-01 MED ORDER — POLYETHYLENE GLYCOL 3350 17 G PO PACK: 17.0000 g | PACK | Freq: Every day | ORAL | Status: DC | PRN

## 2023-09-01 MED ORDER — APIXABAN 5 MG PO TABS
5.0000 mg | ORAL_TABLET | Freq: Two times a day (BID) | ORAL | Status: DC
  Administered 2023-09-01 – 2023-09-04 (×6): 5 mg via ORAL
  Filled 2023-09-01 (×6): qty 1

## 2023-09-01 MED ORDER — SODIUM CHLORIDE 0.9% FLUSH
3.0000 mL | Freq: Two times a day (BID) | INTRAVENOUS | Status: DC
  Administered 2023-09-01 – 2023-09-04 (×6): 3 mL via INTRAVENOUS

## 2023-09-01 NOTE — ED Provider Notes (Signed)
[place] [facility]GENC[facility] DEPARTMENT AT [facility] Provider Note   CSN: [id number] Arrival date & time: 09/01/23  1119     History  Chief Complaint  Patient presents with   Shortness of Breath    George Boyer is a 72 y.o. male.  The history is provided by the patient, medical records and a significant other. No language interpreter was used.  Shortness of Breath Severity:  Moderate Onset quality:  Gradual Duration:  2 weeks Timing:  Constant Progression:  Waxing and waning Chronicity:  New Context: URI (ome chronic cough)   Relieved by:  Nothing Worsened by:  Exertion Ineffective treatments:  None tried Associated symptoms: chest pain, cough and sputum production   Associated symptoms: no abdominal pain, no fever, no headaches, no neck pain, no rash, no syncope, no vomiting and no wheezing   Risk factors: no hx of PE/DVT        Home Medications Prior to Admission medications   Medication Sig Start Date End Date Taking? Authorizing Provider  ACCU-CHEK AVIVA PLUS test strip USE TO TEST BLOOD SUGARS FOUR TIMES DAILY DX E11.9 AND Z79.4 09/03/18   [provider]  Accu-Chek FastClix Lancets MISC Use to monitor blood glucose 4 time(s) daily DX CODE E11.9 and Z79.4 02/08/19   [provider]  amiodarone (PACERONE) 200 MG tablet Take 200 mg by mouth daily.    [provider]  amiodarone (PACERONE) 200 MG tablet Take by mouth.    [provider]  amLODipine (NORVASC) 5 MG tablet TK 1 T PO QAM 06/23/18   [provider]  apixaban (ELIQUIS) 5 MG TABS tablet Take 5 mg by mouth 2 (two) times daily.    [provider]  atorvastatin (LIPITOR) 40 MG tablet Take 40 mg by mouth daily.    [provider]  benzonatate (TESSALON) 100 MG capsule TK ONE C PO TID FOR 10 DAYS 07/16/18   [provider]  buPROPion (WELLBUTRIN XL) 150 MG 24 hr tablet TAKE 1 TABLET BY MOUTH EVERY DAY IN THE MORNING 07/05/18   [provider]  cholecalciferol (VITAMIN D) 1000 UNITS tablet Take 2,000 Units by mouth daily.    [provider]  cyanocobalamin (,VITAMIN B-12,) 1000 MCG/ML injection INJECT 1 ML IN THE MUSCLE EVERY 30 DAYS 11/12/18   [provider]  cyclobenzaprine (FLEXERIL) 10 MG tablet Take 1 tablet (10 mg total) by mouth 2 (two) times daily as needed for muscle spasms. 02/19/13   Nelva Nay, MD  donepezil (ARICEPT) 5 MG tablet Take by mouth. 02/09/22   [provider]  FARXIGA 5 MG TABS tablet Take 5 mg by mouth daily. 06/07/22   [provider]  fluticasone (FLOVENT HFA) 220 MCG/ACT inhaler Inhale into the lungs. 12/20/16   [provider]  Folic Acid-Cholecalciferol 02-980 MG-UNIT TABS Take by mouth.    [provider]  furosemide (LASIX) 40 MG tablet Take 40 mg by mouth daily.    [provider]  HYDROcodone-acetaminophen (NORCO/VICODIN) 5-325 MG per tablet Take 1-2 tablets by mouth every 6 (six) hours as needed for pain. 02/21/13   Pollyann Savoy, MD  hydrocortisone 2.5 % ointment APP TOPICALLY AA BID FOR 7 DAYS 06/28/18   [provider]  Insulin Glargine, 1 Unit Dial, 300 UNIT/ML SOPN Inject 60 units under the skin once daily. 08/01/19   [provider]  insulin NPH (HUMULIN N,NOVOLIN N) 100 UNIT/ML injection Inject 40 Units into the skin 2 (two) times daily.  30 units in the morning.  40-50 units in the evening.    [provider]  insulin regular (HUMULIN R) 100 units/mL injection Inject 20 Units into the skin 3 (three) times daily before meals. 20 unit morning, 40 units lunch, and 30 units in the evening.    [provider]  Insulin Syringe-Needle U-100 (INSULIN SYRINGE 1CC/31GX5/16") 31G X 5/16" 1 ML MISC Use with insulin five (5) times daily  DX E11.42 and Z79.4 02/08/19   [provider]  irbesartan (AVAPRO) 150 MG tablet Take 150 mg by mouth daily.    [provider]  isosorbide  mononitrate (IMDUR) 30 MG 24 hr tablet Take by mouth. 05/04/20   [provider]  lidocaine (LIDODERM) 5 % Place 1 patch onto the skin daily as needed. Apply patch to area most significant pain once per day.  Remove and discard patch within 12 hours of application. 06/19/22   Petrucelli, Samantha R, PA-C  nitroGLYCERIN (NITROSTAT) 0.4 MG SL tablet Place under the tongue. 08/05/13   [provider]  pravastatin (PRAVACHOL) 20 MG tablet TK 1 T PO HS 09/07/18   [provider]  predniSONE (DELTASONE) 20 MG tablet Take 2 tablets (40 mg total) by mouth daily. 02/21/13   Pollyann Savoy, MD  ranolazine (RANEXA) 500 MG 12 hr tablet TK 1 T PO BID 09/13/18   [provider]  TOUJEO SOLOSTAR 300 UNIT/ML SOPN ADM 60 UNI [place] QD 08/01/19   [provider]  TRADJENTA 5 MG TABS tablet  08/01/19   [provider]  Vitamin D, Ergocalciferol, (DRISDOL) 50000 units CAPS capsule TK ONE C PO TWICE WEEKLY 09/07/18   [provider]      Allergies    Sunflower oil, Bupropion, Glucophage [metformin hcl], and Semaglutide    Review of Systems   Review of Systems  Constitutional:  Positive for fatigue. Negative for chills and fever.  HENT:  Positive for congestion.   Eyes:  Negative for visual disturbance.  Respiratory:  Positive for cough, sputum production, chest tightness and shortness of breath. Negative for wheezing.   Cardiovascular:  Positive for chest pain and leg swelling. Negative for palpitations and syncope.  Gastrointestinal:  Positive for nausea. Negative for abdominal pain, constipation, diarrhea and vomiting.  Genitourinary:  Positive for decreased urine volume. Negative for dysuria and flank pain.  Musculoskeletal:  Positive for back pain. Negative for neck pain and neck stiffness.  Skin:  Negative for rash.  Neurological:  Positive for light-headedness. Negative for dizziness, seizures, speech difficulty, weakness and headaches.   Psychiatric/Behavioral:  Negative for agitation and confusion.   All other systems reviewed and are negative.   Physical Exam Updated Vital Signs BP (!) 132/56   Pulse (!) 53   Temp 98 F (36.7 C) (Oral)   Resp 20   Ht 5\' 8"  (1.727 m)   Wt 109.8 kg   SpO2 100%   BMI 36.80 kg/m  Physical Exam Vitals and nursing note reviewed.  Constitutional:      General: He is not in acute distress.    Appearance: He is well-developed. He is not ill-appearing, toxic-appearing or diaphoretic.  HENT:     Head: Normocephalic and atraumatic.     Mouth/Throat:     Mouth: Mucous membranes are moist.  Eyes:     Conjunctiva/sclera: Conjunctivae normal.  Cardiovascular:     Rate and Rhythm: Normal rate and regular rhythm. No extrasystoles are present.    Heart sounds: Murmur heard.  Pulmonary:     Effort: Pulmonary effort is normal. No tachypnea or respiratory distress.     Breath sounds: Rhonchi and rales present. No decreased breath sounds.  Chest:     Chest wall: No tenderness.  Abdominal:     Palpations: Abdomen is soft.     Tenderness: There is no abdominal tenderness.  Musculoskeletal:        General: No swelling.     Cervical back: Neck supple.     Right lower leg: No tenderness. Edema present.     Left lower leg: No tenderness. Edema present.  Skin:    General: Skin is warm and dry.     Capillary Refill: Capillary refill takes less than 2 seconds.     Findings: No erythema.  Neurological:     General: No focal deficit present.     Mental Status: He is alert.  Psychiatric:        Mood and Affect: Mood normal.     ED Results / Procedures / Treatments   Labs (all labs ordered are listed, but only abnormal results are displayed) Labs Reviewed  COMPREHENSIVE METABOLIC PANEL - Abnormal; Notable for the following components:      Result Value   Potassium 5.5 (*)    Glucose, Bld 177 (*)    BUN 42 (*)    Creatinine, Ser 2.20 (*)    Calcium 8.8 (*)    Albumin 3.4 (*)    GFR,  Estimated 31 (*)    All other components within normal limits  CBC - Abnormal; Notable for the following components:   RBC 3.32 (*)    Hemoglobin 8.9 (*)    HCT 27.7 (*)    RDW 16.6 (*)    All other components within normal limits  BRAIN NATRIURETIC PEPTIDE - Abnormal; Notable for the following components:   B Natriuretic Peptide 500.6 (*)    All other components within normal limits  LACTIC ACID, PLASMA - Abnormal; Notable for the following components:   Lactic Acid, Venous 2.1 (*)    All other components within normal limits  URINALYSIS, W/ REFLEX TO CULTURE (INFECTION SUSPECTED) - Abnormal; Notable for the following components:   Glucose, UA 500 (*)    All other components within normal limits  TROPONIN I (HIGH SENSITIVITY) - Abnormal; Notable for the following components:   Troponin I (High Sensitivity) 30 (*)    All other components within normal limits  TROPONIN I (HIGH SENSITIVITY) - Abnormal; Notable for the following components:   Troponin I (High Sensitivity) 27 (*)    All other components within normal limits  RESP PANEL BY RT-PCR (RSV, FLU A&B, COVID)  RVPGX2  LIPASE, BLOOD  LACTIC ACID, PLASMA  TSH  MAGNESIUM    EKG EKG Interpretation Date/Time:  Friday September 01 2023 11:36:40 EDT Ventricular Rate:  52 PR Interval:    QRS Duration:  156 QT Interval:  492 QTC Calculation: 458 R Axis:   -50  Text Interpretation: sinus bradycardia Ventricular premature complex RBBB and LAFB when 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 No STEMI Confirmed by Theda Belfast ([id number]) on 09/01/2023 11:42:44 AM  Radiology CT ABDOMEN PELVIS WO CONTRAST  Result Date: 09/01/2023 CLINICAL DATA:  Acute non localized epigastric abdominal and mid back pain. EXAM: CT ABDOMEN AND PELVIS WITHOUT CONTRAST TECHNIQUE: Multidetector CT imaging of the abdomen and pelvis was performed following the standard protocol without IV contrast. RADIATION DOSE REDUCTION: This exam was performed according  to the departmental dose-optimization program which  includes automated exposure control, adjustment of the mA and/or kV according to patient size and/or use of iterative reconstruction technique. COMPARISON:  None Available. FINDINGS: Lower chest: Limited visualization of the lower thorax demonstrates a trace bilateral pleural effusions, right-greater-than-left. Limited visualization of the lower thorax demonstrates punctate granuloma Normal heart size. Coronary artery calcifications. Calcifications involving the aortic leaflets. Hepatobiliary: Normal hepatic contour. There are multiple layering gallstones within the gallbladder with potential trace amount of pericholecystic fluid. No ascites. Pancreas: The pancreas is largely fatty replaced. Spleen: The spleen is somewhat atrophic but otherwise normal appearance. Adrenals/Urinary Tract: Normal noncontrast appearance of the bilateral kidneys. Extensive calcifications about the bilateral renal hila are favored to be vascular in etiology. No evidence of nephrolithiasis. There is a 1.9 cm hypoattenuating lesion within the medial aspect of the left kidney (coronal image 41, series 5), which is incompletely characterized on this noncontrast examination though favored to represent a renal cyst. Normal noncontrast appearance of the left kidney. There is a minimal amount of grossly symmetric bilateral perinephric stranding without evidence of urinary obstruction. Normal appearance of the bilateral adrenal glands. Normal appearance of the urinary bladder given degree of distention. Stomach/Bowel: Moderate to large colonic stool burden without evidence of enteric obstruction. Normal appearance of the terminal ileum. The appendix is not visualized however there is no pericecal inflammatory change. Small hiatal hernia. No pneumoperitoneum, pneumatosis or portal venous gas. Vascular/Lymphatic: Scattered atherosclerotic plaque within a normal caliber abdominal aorta. There are  extensive thin mural calcifications involving the mesenteric vessels of the abdomen suggestive of advanced diabetes. No bulky retroperitoneal, mesenteric, pelvic or inguinal lymphadenopathy. Reproductive: Dystrophic calcifications within a normal-sized prostate gland. Trace amount of fluid within the pelvic cul-de-sac. Other: Diffuse body wall anasarca. Musculoskeletal: No acute or aggressive osseous abnormalities. 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 IMPRESSION: 1. Cholelithiasis with potential trace amount of pericholecystic fluid, nonspecific in the setting of diffuse body wall anasarca though could be seen in the setting of acute cholecystitis. Clinical correlation is advised. Further evaluation with right upper quadrant ultrasound could be performed as indicated. 2. Cardiomegaly with suspected mild pulmonary edema with trace bilateral effusions and diffuse body wall anasarca. 3. Moderate to large colonic stool burden without evidence of enteric obstruction. 4. Extensive thin mural calcifications involving the mesenteric vessels of the abdomen suggestive of advanced diabetes. 5. Aortic Atherosclerosis (ICD10-I70.0). Electronically Signed   By: Simonne Come M.D.   On: 09/01/2023 14:36   US Abdomen Limited RUQ (LIVER/GB)  Result Date: 09/01/2023 CLINICAL DATA:  Right upper quadrant pain for 2 weeks. EXAM: ULTRASOUND ABDOMEN LIMITED RIGHT UPPER QUADRANT COMPARISON:  None Available. FINDINGS: Gallbladder: Gallbladder is mildly distended with numerous layering stones. No wall thickening or adjacent fluid. Common bile duct: Diameter: 3 mm Liver: No focal lesion identified. Within normal limits in parenchymal echogenicity. Portal vein is patent on color Doppler imaging with normal direction of blood flow towards the liver. Other: Small right pleural effusion IMPRESSION: Gallstones. No further sonographic evidence of acute cholecystitis. No ductal dilatation. Small right pleural effusion  Electronically Signed   By: Karen Kays M.D.   On: 09/01/2023 13:18   DG Chest Port 1 View  Result Date: 09/01/2023 CLINICAL DATA:  Shortness of breath. EXAM: PORTABLE CHEST 1 VIEW COMPARISON:  06/18/2022 FINDINGS: Unchanged cardiomegaly. Small right pleural effusion. There is slight interstitial thickening that may represent pulmonary edema. No confluent airspace disease. No pneumothorax. IMPRESSION: Cardiomegaly with small right pleural effusion. Slight interstitial thickening that may represent pulmonary  edema. Findings typical of CHF. Electronically Signed   By: Narda Rutherford M.D.   On: 09/01/2023 13:05    Procedures Procedures    Medications Ordered in ED Medications  furosemide (LASIX) injection 40 mg (has no administration in time range)    ED Course/ Medical Decision Making/ A&P                                 Medical Decision Making Amount and/or Complexity of Data Reviewed Labs: ordered. Radiology: ordered.  Risk Prescription drug management. Decision regarding hospitalization.    George Boyer is a 72 y.o. male with a past medical history significant for CAD status post PCI, diabetes, atrial fibrillation on Eliquis therapy, CKD, aortic valve stenosis, asthma, sleep apnea using CPAP at night, dyslipidemia, anxiety, depression, and chronic cough who presents with 2 weeks of weight gain, worsened peripheral edema, pain in the low back that is more chronic but also some epigastric abdominal pain and chest pain, persistent cough, exertional shortness of breath, and malaise.  According to patient and family, patient has had some shortness of breath that is worsening but does not seem to be pleuritic but it is somewhat exertional.  He is denying any chest pain now but reports he did have some chest pressure earlier and over the last week and also some discomfort in his upper abdomen.  He reports when he drinks fluids he gets to feeling full very fast and gets epigastric  discomfort.  He reports some nausea but no vomiting.  Denies any constipation or diarrhea.  Reports decreased urine.  He has been taking Lasix today but he has not taken in the last few weeks and he is concerned about his kidney function as he has had edema in the setting of kidney worsening.  He denies any trauma aside from a fall several weeks ago.  He reports edema in the legs that are sore but no history of blood clots.  No other headache, neck pain, or pains.  On exam, lungs had some rales and some faint coarseness.  I did hear murmur.  Chest was nontender but abdomen was tender primary on the right upper quadrant and epigastric area.  Lower abdomen nontender.  Bowel sounds were appreciated.  CVA areas nontender.  Legs are edematous bilaterally but he had intact pulses.  EKG showed a sinus bradycardia with no STEMI.  Clinically concerned about different etiologies of the patient's symptoms.  Concerned about CHF exacerbation or kidney dysfunction given the worsening peripheral edema.  I suspect this contributes to his fatigue and shortness of breath and chest tightness.  Due to the tenderness in his right upper quadrant and epigastric area we will get ultrasound and a CT scan.  Will wait for labs to determine if he can get contrast.  Given his lack of sharp stabbing pain going from the chest to the back, have less suspicion for an aortic etiology of symptoms we will hold on CTA at this time.  He will get chest x-ray and labs.  Anticipate reassessment after workup to determine disposition.  2:53 PM Workup continues to return.  Ultrasound showed gallstones but no acute cholecystitis and CT showed gallstones and some pericholecystic fluid could be acute cholecystitis early.  Patient reassessed and he is not having abdominal tenderness or pain at this time, have less suspicion for acute cholecystitis now.  What also is seen is elevated BNP over 500 and  some fluid on his chest x-ray.  I suspect he is  having fluid overload that may have also caused some of this.  Cholecystic fluid.  His kidney function is elevated with AKI and when I was talking with him, his oxygen saturations dropped to 88% on room air.  He will be placed on 2 L of oxygen with his shortness of breath.  I feel he needs admission for both AKI and fluid overload for safe diuresis, further assessment of his abdomen if he develops more pain may need to have general surgery see him.  We will call them in consultation but I will suspicion they will want to operate given his other issues going on and now improvement in his abdominal discomfort.  Anticipate admission for further management.  2:56 PM Spoke with Tresa Endo with general surgery who agrees this seems like pericholecystic fluid related to his fluid overload.  They felt that he was reasonable for admission to medicine and if you develop worsened abdominal pain or developed LFT elevation, could consider HIDA scan and if that was abnormal see them for more formal consultation.  Given his resolution of abdominal discomfort now, the new hypoxia, and his clear fluid overload, will admit for medicine admission for further management.  3:11 PM Medicine will admit for further management.  They requested 40 of IV Lasix to get the diuresis started.        Final Clinical Impression(s) / ED Diagnoses Final diagnoses:  AKI (acute kidney injury) (HCC)  Hypervolemia, unspecified hypervolemia type  Hypoxia  Abnormal CT of the abdomen     Clinical Impression: 1. AKI (acute kidney injury) (HCC)   2. Hypervolemia, unspecified hypervolemia type   3. Hypoxia   4. Abnormal CT of the abdomen     Disposition: Admit  This note was prepared with assistance of Dragon voice recognition software. Occasional wrong-word or sound-a-like substitutions may have occurred due to the inherent limitations of voice recognition software.      Carrissa Taitano, Canary Brim, MD 09/01/23 [id number]

## 2023-09-01 NOTE — H&P (Signed)
History and Physical   NATHAINEL MORONE ZOX:[id number] DOB: 03-29-1951 DOA: 09/01/2023  PCP: [name], MD   Patient coming from: Home  Chief Complaint: Fatigue and shortness of breath  HPI: George Boyer is a 72 y.o. male with medical history significant of hypertension, hyperlipidemia, CAD status post stent, diabetes, atrial fibrillation, diastolic CHF, CKD 3, OSA, obesity, anxiety, depression, mild cognitive impairment, anemia, bradycardia, carotid artery disease presenting with shortness of breath and fatigue.  Patient reports that over the past couple weeks he has noticed around a 14 pound weight gain and has also had some worsening lower extremity edema.  He reports that for the past day he is began to notice worsening shortness of breath and fatigue as well.  He is currently prescribed Lasix as needed for edema and as above does have a diagnosis of diastolic dysfunction.  Did start taking Lasix this morning due to the edema.  He denies fevers, chills, chest pain, abdominal pain, constipation, diarrhea, nausea, vomiting.  ED Course: Vital signs in the ED notable for blood pressure in the 130s systolic, heart rate in the 40s to 50s, respiratory rate in the 20s requiring 2 L to maintain saturations.  Lab workup included CMP with potassium 5.5, BUN 42, creatinine elevated 2.2 from baseline 1.5, glucose 177, calcium 8.8, albumin 3.4.  CBC with hemoglobin 8.9 which is down from 12 3 years ago but no interval labs, MCV low normal.  BNP elevated to 500, troponin flat at 30 and then 27 on repeat.  Lactic acid 2.1 and then normal on repeat.  Lipase normal.  Respiratory panel for flu COVID RSV negative.  TSH normal.  Urinalysis with glucose only.  Chest x-ray showed cardiomegaly, small right pleural effusion, changes suspicious for Neri edema.  Right upper quadrant ultrasound showed gallstones only.  CT of the abdomen pelvis also demonstrated gallstones and some mild pericholecystic fluid, further  demonstrated cardiomegaly, pulmonary edema, large stool burden.  Patient received 40 mg IV Lasix in the ED.  Review of Systems: As per HPI otherwise all other systems reviewed and are negative.  Past Medical History:  Diagnosis Date   Asthma    Atrial fibrillation (HCC)    Cellulitis of right hand 06/04/2020   Coronary artery disease    Diabetes mellitus without complication (HCC)    Hypertension     Past Surgical History:  Procedure Laterality Date   CORONARY STENT PLACEMENT     TONSILLECTOMY      Social History  reports that he has never smoked. He has never used smokeless tobacco. He reports that he does not drink alcohol and does not use drugs.  Allergies  Allergen Reactions   Sunflower Oil Anaphylaxis    Swelling of throat Swelling of throat Swelling of throat-any sunflower product Swelling of throat-any sunflower product    Bupropion Other (See Comments)    Tremors   Glucophage [Metformin Hcl] Diarrhea   Semaglutide Other (See Comments)    History reviewed. No pertinent family history.  Prior to Admission medications   Medication Sig Start Date End Date Taking? Authorizing Provider  ACCU-CHEK AVIVA PLUS test strip USE TO TEST BLOOD SUGARS FOUR TIMES DAILY DX E11.9 AND Z79.4 09/03/18   [provider]  Accu-Chek FastClix Lancets MISC Use to monitor blood glucose 4 time(s) daily DX CODE E11.9 and Z79.4 02/08/19   [provider]  amiodarone (PACERONE) 200 MG tablet Take 200 mg by mouth daily.    [provider]  amLODipine (  NORVASC) 5 MG tablet TK 1 T PO QAM 06/23/18   [provider]  apixaban (ELIQUIS) 5 MG TABS tablet Take 5 mg by mouth 2 (two) times daily.    [provider]  benzonatate (TESSALON) 100 MG capsule TK ONE C PO TID FOR 10 DAYS 07/16/18   [provider]  buPROPion (WELLBUTRIN XL) 150 MG 24 hr tablet TAKE 1 TABLET BY MOUTH EVERY DAY IN THE MORNING 07/05/18   [provider]  cholecalciferol  (VITAMIN D) 1000 UNITS tablet Take 2,000 Units by mouth daily.    [provider]  cyanocobalamin (,VITAMIN B-12,) 1000 MCG/ML injection INJECT 1 ML IN THE MUSCLE EVERY 30 DAYS 11/12/18   [provider]  donepezil (ARICEPT) 5 MG tablet Take by mouth. 02/09/22   [provider]  FARXIGA 5 MG TABS tablet Take 5 mg by mouth daily. 06/07/22   [provider]  fluticasone (FLOVENT HFA) 220 MCG/ACT inhaler Inhale into the lungs. 12/20/16   [provider]  Folic Acid-Cholecalciferol 02-980 MG-UNIT TABS Take by mouth.    [provider]  furosemide (LASIX) 40 MG tablet Take 40 mg by mouth daily.    [provider]  hydrocortisone 2.5 % ointment APP TOPICALLY AA BID FOR 7 DAYS 06/28/18   [provider]  Insulin Glargine, 1 Unit Dial, 300 UNIT/ML SOPN Inject 60 units under the skin once daily. 08/01/19   [provider]  insulin NPH (HUMULIN N,NOVOLIN N) 100 UNIT/ML injection Inject 40 Units into the skin 2 (two) times daily. 30 units in the morning.  40-50 units in the evening.    [provider]  insulin regular (HUMULIN R) 100 units/mL injection Inject 20 Units into the skin 3 (three) times daily before meals. 20 unit morning, 40 units lunch, and 30 units in the evening.    [provider]  Insulin Syringe-Needle U-100 (INSULIN SYRINGE 1CC/31GX5/16") 31G X 5/16" 1 ML MISC Use with insulin five (5) times daily  DX E11.42 and Z79.4 02/08/19   [provider]  irbesartan (AVAPRO) 150 MG tablet Take 150 mg by mouth daily.    [provider]  isosorbide mononitrate (IMDUR) 30 MG 24 hr tablet Take by mouth. 05/04/20   [provider]  lidocaine (LIDODERM) 5 % Place 1 patch onto the skin daily as needed. Apply patch to area most significant pain once per day.  Remove and discard patch within 12 hours of application. 06/19/22   Petrucelli, Samantha R, PA-C  nitroGLYCERIN (NITROSTAT) 0.4 MG SL  tablet Place under the tongue. 08/05/13   [provider]  pravastatin (PRAVACHOL) 20 MG tablet TK 1 T PO HS 09/07/18   [provider]  predniSONE (DELTASONE) 20 MG tablet Take 2 tablets (40 mg total) by mouth daily. 02/21/13   Pollyann Savoy, MD  ranolazine (RANEXA) 500 MG 12 hr tablet TK 1 T PO BID 09/13/18   [provider]  TOUJEO SOLOSTAR 300 UNIT/ML SOPN ADM 60 UNI [place] QD 08/01/19   [provider]  TRADJENTA 5 MG TABS tablet  08/01/19   [provider]  Vitamin D, Ergocalciferol, (DRISDOL) 50000 units CAPS capsule TK ONE C PO TWICE WEEKLY 09/07/18   [provider]    Physical Exam: Vitals:   09/01/23 1134 09/01/23 1430 09/01/23 1534 09/01/23 1655  BP:  (!) 130/48  106/76  Pulse:  (!) 49 (!) 51 (!) 51  Resp:  (!) 24 (!) 25 17  Temp: 98  F (36.7 C)  98.1 F (36.7 C) 97.8 F (36.6 C)  TempSrc: Oral  Oral Oral  SpO2:  91% 100% 100%  Weight:      Height:        Physical Exam Constitutional:      General: He is not in acute distress.    Appearance: Normal appearance. He is obese.  HENT:     Head: Normocephalic and atraumatic.     Mouth/Throat:     Mouth: Mucous membranes are moist.     Pharynx: Oropharynx is clear.  Eyes:     Extraocular Movements: Extraocular movements intact.     Pupils: Pupils are equal, round, and reactive to light.  Cardiovascular:     Rate and Rhythm: Normal rate and regular rhythm.     Pulses: Normal pulses.     Heart sounds: Normal heart sounds.  Pulmonary:     Effort: Pulmonary effort is normal. No respiratory distress.     Breath sounds: Rales (trace) present.  Abdominal:     General: Bowel sounds are normal. There is no distension.     Palpations: Abdomen is soft.     Tenderness: There is no abdominal tenderness.  Musculoskeletal:        General: No swelling or deformity.     Right lower leg: Edema present.     Left lower leg: Edema present.  Skin:    General: Skin is warm and dry.   Neurological:     General: No focal deficit present.     Mental Status: Mental status is at baseline.     Labs on Admission: I have personally reviewed following labs and imaging studies  CBC: Recent Labs  Lab 09/01/23 1143  WBC 7.4  HGB 8.9*  HCT 27.7*  MCV 83.4  PLT 284    Basic Metabolic Panel: Recent Labs  Lab 09/01/23 1143 09/01/23 1223  NA 136  --   K 5.5*  --   CL 107  --   CO2 22  --   GLUCOSE 177*  --   BUN 42*  --   CREATININE 2.20*  --   CALCIUM 8.8*  --   MG  --  2.3    GFR: Estimated Creatinine Clearance: 35.4 mL/min (A) (by C-G formula based on SCr of 2.2 mg/dL (H)).  Liver Function Tests: Recent Labs  Lab 09/01/23 1143  AST 20  ALT 14  ALKPHOS 68  BILITOT 0.7  PROT 7.1  ALBUMIN 3.4*    Urine analysis:    Component Value Date/Time   COLORURINE YELLOW 09/01/2023 1352   APPEARANCEUR CLEAR 09/01/2023 1352   LABSPEC 1.008 09/01/2023 1352   PHURINE 5.0 09/01/2023 1352   GLUCOSEU 500 (A) 09/01/2023 1352   HGBUR NEGATIVE 09/01/2023 1352   BILIRUBINUR NEGATIVE 09/01/2023 1352   KETONESUR NEGATIVE 09/01/2023 1352   PROTEINUR NEGATIVE 09/01/2023 1352   NITRITE NEGATIVE 09/01/2023 1352   LEUKOCYTESUR NEGATIVE 09/01/2023 1352    Radiological Exams on Admission: CT ABDOMEN PELVIS WO CONTRAST  Result Date: 09/01/2023 CLINICAL DATA:  Acute non localized epigastric abdominal and mid back pain. EXAM: CT ABDOMEN AND PELVIS WITHOUT CONTRAST TECHNIQUE: Multidetector CT imaging of the abdomen and pelvis was performed following the standard protocol without IV contrast. RADIATION DOSE REDUCTION: This exam was performed according to the departmental dose-optimization program which includes automated exposure control, adjustment of the mA and/or kV according to patient size and/or use of iterative reconstruction technique. COMPARISON:  None Available. FINDINGS: Lower chest: Limited visualization  of the lower thorax demonstrates a trace bilateral pleural  effusions, right-greater-than-left. Limited visualization of the lower thorax demonstrates punctate granuloma Normal heart size. Coronary artery calcifications. Calcifications involving the aortic leaflets. Hepatobiliary: Normal hepatic contour. There are multiple layering gallstones within the gallbladder with potential trace amount of pericholecystic fluid. No ascites. Pancreas: The pancreas is largely fatty replaced. Spleen: The spleen is somewhat atrophic but otherwise normal appearance. Adrenals/Urinary Tract: Normal noncontrast appearance of the bilateral kidneys. Extensive calcifications about the bilateral renal hila are favored to be vascular in etiology. No evidence of nephrolithiasis. There is a 1.9 cm hypoattenuating lesion within the medial aspect of the left kidney (coronal image 41, series 5), which is incompletely characterized on this noncontrast examination though favored to represent a renal cyst. Normal noncontrast appearance of the left kidney. There is a minimal amount of grossly symmetric bilateral perinephric stranding without evidence of urinary obstruction. Normal appearance of the bilateral adrenal glands. Normal appearance of the urinary bladder given degree of distention. Stomach/Bowel: Moderate to large colonic stool burden without evidence of enteric obstruction. Normal appearance of the terminal ileum. The appendix is not visualized however there is no pericecal inflammatory change. Small hiatal hernia. No pneumoperitoneum, pneumatosis or portal venous gas. Vascular/Lymphatic: Scattered atherosclerotic plaque within a normal caliber abdominal aorta. There are extensive thin mural calcifications involving the mesenteric vessels of the abdomen suggestive of advanced diabetes. No bulky retroperitoneal, mesenteric, pelvic or inguinal lymphadenopathy. Reproductive: Dystrophic calcifications within a normal-sized prostate gland. Trace amount of fluid within the pelvic cul-de-sac. Other:  Diffuse body wall anasarca. Musculoskeletal: No acute or aggressive osseous abnormalities. 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 IMPRESSION: 1. Cholelithiasis with potential trace amount of pericholecystic fluid, nonspecific in the setting of diffuse body wall anasarca though could be seen in the setting of acute cholecystitis. Clinical correlation is advised. Further evaluation with right upper quadrant ultrasound could be performed as indicated. 2. Cardiomegaly with suspected mild pulmonary edema with trace bilateral effusions and diffuse body wall anasarca. 3. Moderate to large colonic stool burden without evidence of enteric obstruction. 4. Extensive thin mural calcifications involving the mesenteric vessels of the abdomen suggestive of advanced diabetes. 5. Aortic Atherosclerosis (ICD10-I70.0). Electronically Signed   By: Simonne Come M.D.   On: 09/01/2023 14:36   US Abdomen Limited RUQ (LIVER/GB)  Result Date: 09/01/2023 CLINICAL DATA:  Right upper quadrant pain for 2 weeks. EXAM: ULTRASOUND ABDOMEN LIMITED RIGHT UPPER QUADRANT COMPARISON:  None Available. FINDINGS: Gallbladder: Gallbladder is mildly distended with numerous layering stones. No wall thickening or adjacent fluid. Common bile duct: Diameter: 3 mm Liver: No focal lesion identified. Within normal limits in parenchymal echogenicity. Portal vein is patent on color Doppler imaging with normal direction of blood flow towards the liver. Other: Small right pleural effusion IMPRESSION: Gallstones. No further sonographic evidence of acute cholecystitis. No ductal dilatation. Small right pleural effusion Electronically Signed   By: Karen Kays M.D.   On: 09/01/2023 13:18   DG Chest Port 1 View  Result Date: 09/01/2023 CLINICAL DATA:  Shortness of breath. EXAM: PORTABLE CHEST 1 VIEW COMPARISON:  06/18/2022 FINDINGS: Unchanged cardiomegaly. Small right pleural effusion. There is slight interstitial thickening that may represent  pulmonary edema. No confluent airspace disease. No pneumothorax. IMPRESSION: Cardiomegaly with small right pleural effusion. Slight interstitial thickening that may represent pulmonary edema. Findings typical of CHF. Electronically Signed   By: Narda Rutherford M.D.   On: 09/01/2023 13:05    EKG: Independently reviewed.  Sinus bradycardia  at 52 bpm.  Nonspecific T wave flattening.  Bifascicular block.  Similar to previous, but rate slower.  Assessment/Plan Principal Problem:   Acute on chronic diastolic (congestive) heart failure (HCC) Active Problems:   Anxiety and depression   Body mass index 40.0-44.9, adult (HCC)   Bradycardia   CAD in native artery   Dyslipidemia   HTN (hypertension)   Obstructive sleep apnea   S/P coronary artery stent placement   T2DM (type 2 diabetes mellitus) (HCC)   Unspecified atrial fibrillation (HCC)   Anemia due to chronic kidney disease   CKD stage 3 due to type 2 diabetes mellitus (HCC)   Hyperlipidemia LDL goal <100   Diastolic dysfunction   Bilateral carotid artery stenosis   Acute on chronic diastolic CHF Aortic stenosis Hypertension > History of diastolic dysfunction last echo [facility] was in May of this year and showed EF 60-65%, mild concentric and LVH with normal wall motion, progressive now moderate aortic stenosis. > 14 pound weight gain, worsening lower extreme edema, 2 L oxygen requirement, creatinine elevated to 2.2 from baseline 1.5, BNP 500, troponin flat at 30, 27, chest x-ray and upper portion of CT abdomen pelvis demonstrating cardiomegaly, pulmonary edema. > Received Lasix in the ED.  Lower blood pressure now we will hold antihypertensives. - Monitor on telemetry overnight - Continue with Lasix 40 mg IV twice daily - Strict I's and O's, daily weights - Repeat echocardiogram - Check magnesium - Trend renal function and electrolytes - Holding amlodipine, irbesartan, Imdur.  Paroxysmal atrial fibrillation Sinus  bradycardia > Known history of A-fib and bradycardia.  Currently heart rate has been in the 40s to  50s which is not unusual for patient and he remains largely asymptomatic from this. - Continue home amiodarone, ranolazine - Continue Eliquis  AKI on CKD 3 > Creatinine elevated to 2.2 in the setting of presumed CHF exacerbation as above. - Trend renal function and electrolyte on diuresis  Hyperlipidemia CAD Carotid artery disease > History of coronary stenting - Continue home statin - On Eliquis as above - On ranolazine as above - Holding Imdur, irbesartan  Diabetes > 35U BID at home. - 25U BID, SSI  OSA - Continue home CPAP  Obesity - Noted  MCI - Continue home donepezil  Anxiety Depression - Continue home bupropion  Anemia > Microcytic anemia at 8.9 down from 12 3 years ago. - Will add on iron studies and trend CBC  DVT prophylaxis: Eliquis Code Status:   Full Family Communication:  Updated at bedside  Disposition Plan:   Patient is from:  Home  Anticipated DC to:  Home  Anticipated DC date:  1 to 3 days  Anticipated DC barriers: None  Consults called:  None Admission status:  Observation, telemetry  Severity of Illness: The appropriate patient status for this patient is OBSERVATION. Observation status is judged to be reasonable and necessary in order to provide the required intensity of service to ensure the patient's safety. The patient's presenting symptoms, physical exam findings, and initial radiographic and laboratory data in the context of their medical condition is felt to place them at decreased risk for further clinical deterioration. Furthermore, it is anticipated that the patient will be medically stable for discharge from the hospital within 2 midnights of admission.    George Fail MD [facility]  How to contact the [facility] Attending or Consulting provider 7A - 7P or covering provider during after hours 7P -7A, for this patient?    Check  the care team in [facility] and look for a) attending/consulting [facility] provider listed and b) the [facility] team listed Log into [email or web address] and use [place]'s universal password to access. If you do not have the password, please contact the hospital operator. Locate the [facility] provider you are looking for under [facility] and page to a number that you can be directly reached. If you still have difficulty reaching the provider, please page the [facility] (Director on Call) for the Hospitalists listed on amion for assistance.  09/01/2023, 5:53 PM

## 2023-09-01 NOTE — ED Notes (Signed)
Pt placed on 2 lpm [place] due to oxygen sat dropping to 88-89% RA.

## 2023-09-01 NOTE — Plan of Care (Signed)

## 2023-09-01 NOTE — ED Triage Notes (Signed)
Pt POV from home reporting SOB and generalized fatigue since last night. Also reports gaining 14lb in 2 weeks and increased leg swelling, hx CHF, prescribed lasix as needed, took one dose this morning.

## 2023-09-01 NOTE — ED Notes (Signed)
Called [facility] for transport, pt bed assignment is ready

## 2023-09-02 ENCOUNTER — Observation Stay (HOSPITAL_BASED_OUTPATIENT_CLINIC_OR_DEPARTMENT_OTHER): Payer: Medicare Other

## 2023-09-02 DIAGNOSIS — I6523 Occlusion and stenosis of bilateral carotid arteries: Secondary | ICD-10-CM | POA: Diagnosis present

## 2023-09-02 DIAGNOSIS — K5900 Constipation, unspecified: Secondary | ICD-10-CM | POA: Diagnosis present

## 2023-09-02 DIAGNOSIS — I5033 Acute on chronic diastolic (congestive) heart failure: Secondary | ICD-10-CM

## 2023-09-02 DIAGNOSIS — J45909 Unspecified asthma, uncomplicated: Secondary | ICD-10-CM | POA: Diagnosis present

## 2023-09-02 DIAGNOSIS — E669 Obesity, unspecified: Secondary | ICD-10-CM | POA: Diagnosis present

## 2023-09-02 DIAGNOSIS — G4733 Obstructive sleep apnea (adult) (pediatric): Secondary | ICD-10-CM | POA: Diagnosis present

## 2023-09-02 DIAGNOSIS — Z7901 Long term (current) use of anticoagulants: Secondary | ICD-10-CM | POA: Diagnosis not present

## 2023-09-02 DIAGNOSIS — I350 Nonrheumatic aortic (valve) stenosis: Secondary | ICD-10-CM | POA: Diagnosis present

## 2023-09-02 DIAGNOSIS — N184 Chronic kidney disease, stage 4 (severe): Secondary | ICD-10-CM | POA: Diagnosis present

## 2023-09-02 DIAGNOSIS — Z1152 Encounter for screening for COVID-19: Secondary | ICD-10-CM | POA: Diagnosis not present

## 2023-09-02 DIAGNOSIS — E11649 Type 2 diabetes mellitus with hypoglycemia without coma: Secondary | ICD-10-CM | POA: Diagnosis not present

## 2023-09-02 DIAGNOSIS — R0902 Hypoxemia: Secondary | ICD-10-CM | POA: Diagnosis present

## 2023-09-02 DIAGNOSIS — I130 Hypertensive heart and chronic kidney disease with heart failure and stage 1 through stage 4 chronic kidney disease, or unspecified chronic kidney disease: Secondary | ICD-10-CM | POA: Diagnosis present

## 2023-09-02 DIAGNOSIS — F419 Anxiety disorder, unspecified: Secondary | ICD-10-CM | POA: Diagnosis present

## 2023-09-02 DIAGNOSIS — D509 Iron deficiency anemia, unspecified: Secondary | ICD-10-CM | POA: Diagnosis present

## 2023-09-02 DIAGNOSIS — K8020 Calculus of gallbladder without cholecystitis without obstruction: Secondary | ICD-10-CM | POA: Diagnosis present

## 2023-09-02 DIAGNOSIS — Z794 Long term (current) use of insulin: Secondary | ICD-10-CM | POA: Diagnosis not present

## 2023-09-02 DIAGNOSIS — J069 Acute upper respiratory infection, unspecified: Secondary | ICD-10-CM | POA: Diagnosis present

## 2023-09-02 DIAGNOSIS — I480 Paroxysmal atrial fibrillation: Secondary | ICD-10-CM | POA: Diagnosis present

## 2023-09-02 DIAGNOSIS — D631 Anemia in chronic kidney disease: Secondary | ICD-10-CM | POA: Diagnosis present

## 2023-09-02 DIAGNOSIS — F32A Depression, unspecified: Secondary | ICD-10-CM | POA: Diagnosis present

## 2023-09-02 DIAGNOSIS — Z6841 Body Mass Index (BMI) 40.0 and over, adult: Secondary | ICD-10-CM | POA: Diagnosis not present

## 2023-09-02 DIAGNOSIS — N179 Acute kidney failure, unspecified: Secondary | ICD-10-CM | POA: Diagnosis present

## 2023-09-02 DIAGNOSIS — E785 Hyperlipidemia, unspecified: Secondary | ICD-10-CM | POA: Diagnosis present

## 2023-09-02 DIAGNOSIS — E1122 Type 2 diabetes mellitus with diabetic chronic kidney disease: Secondary | ICD-10-CM | POA: Diagnosis present

## 2023-09-02 LAB — COMPREHENSIVE METABOLIC PANEL
ALT: 17 U/L (ref 0–44)
AST: 12 U/L — ABNORMAL LOW (ref 15–41)
Albumin: 2.7 g/dL — ABNORMAL LOW (ref 3.5–5.0)
Alkaline Phosphatase: 63 U/L (ref 38–126)
Anion gap: 7 (ref 5–15)
BUN: 38 mg/dL — ABNORMAL HIGH (ref 8–23)
CO2: 25 mmol/L (ref 22–32)
Calcium: 8.7 mg/dL — ABNORMAL LOW (ref 8.9–10.3)
Chloride: 107 mmol/L (ref 98–111)
Creatinine, Ser: 2.55 mg/dL — ABNORMAL HIGH (ref 0.61–1.24)
GFR, Estimated: 26 mL/min — ABNORMAL LOW (ref >60)
Glucose, Bld: 98 mg/dL (ref 70–99)
Potassium: 4.7 mmol/L (ref 3.5–5.1)
Sodium: 139 mmol/L (ref 135–145)
Total Bilirubin: 0.8 mg/dL (ref 0.3–1.2)
Total Protein: 6.1 g/dL — ABNORMAL LOW (ref 6.5–8.1)

## 2023-09-02 LAB — CBC
HCT: 24.3 % — ABNORMAL LOW (ref 39.0–52.0)
Hemoglobin: 7.7 g/dL — ABNORMAL LOW (ref 13.0–17.0)
MCH: 26 pg (ref 26.0–34.0)
MCHC: 31.7 g/dL (ref 30.0–36.0)
MCV: 82.1 fL (ref 80.0–100.0)
Platelets: 232 10*3/uL (ref 150–400)
RBC: 2.96 MIL/uL — ABNORMAL LOW (ref 4.22–5.81)
RDW: 16.5 % — ABNORMAL HIGH (ref 11.5–15.5)
WBC: 7.3 10*3/uL (ref 4.0–10.5)
nRBC: 0 % (ref 0.0–0.2)

## 2023-09-02 LAB — ECHOCARDIOGRAM COMPLETE
AR max vel: 1.19 cm2
AV Area VTI: 1.18 cm2
AV Area mean vel: 1.12 cm2
AV Mean grad: 18.8 mm[Hg]
AV Peak grad: 32.1 mm[Hg]
Ao pk vel: 2.83 m/s
Area-P 1/2: 3.5 cm2
Height: 68 in
MV VTI: 1.68 cm2
S' Lateral: 2.8 cm
Weight: 3724.89 [oz_av]

## 2023-09-02 LAB — PREPARE RBC (CROSSMATCH)

## 2023-09-02 LAB — GLUCOSE, CAPILLARY
Glucose-Capillary: 68 mg/dL — ABNORMAL LOW (ref 70–99)
Glucose-Capillary: 90 mg/dL (ref 70–99)
Glucose-Capillary: 102 mg/dL — ABNORMAL HIGH (ref 70–99)
Glucose-Capillary: 107 mg/dL — ABNORMAL HIGH (ref 70–99)
Glucose-Capillary: 139 mg/dL — ABNORMAL HIGH (ref 70–99)

## 2023-09-02 LAB — IRON AND TIBC
Iron: 13 ug/dL — ABNORMAL LOW (ref 45–182)
Saturation Ratios: 4 % — ABNORMAL LOW (ref 17.9–39.5)
TIBC: 371 ug/dL (ref 250–450)
UIBC: 358 ug/dL

## 2023-09-02 LAB — ABO/RH: ABO/RH(D): O POS

## 2023-09-02 LAB — RETICULOCYTES
Immature Retic Fract: 34.9 % — ABNORMAL HIGH (ref 2.3–15.9)
RBC.: 2.94 MIL/uL — ABNORMAL LOW (ref 4.22–5.81)
Retic Count, Absolute: 49.1 10*3/uL (ref 19.0–186.0)
Retic Ct Pct: 1.7 % (ref 0.4–3.1)

## 2023-09-02 LAB — FERRITIN: Ferritin: 7 ng/mL — ABNORMAL LOW (ref 24–336)

## 2023-09-02 LAB — MAGNESIUM: Magnesium: 2.3 mg/dL (ref 1.7–2.4)

## 2023-09-02 MED ORDER — INSULIN GLARGINE-YFGN 100 UNIT/ML ~~LOC~~ SOLN
20.0000 [IU] | Freq: Two times a day (BID) | SUBCUTANEOUS | Status: DC
  Filled 2023-09-02 (×3): qty 0.2

## 2023-09-02 MED ORDER — FUROSEMIDE 10 MG/ML IJ SOLN
40.0000 mg | Freq: Two times a day (BID) | INTRAMUSCULAR | Status: DC
  Administered 2023-09-02 – 2023-09-03 (×3): 40 mg via INTRAVENOUS
  Filled 2023-09-02 (×3): qty 4

## 2023-09-02 MED ORDER — PERFLUTREN LIPID MICROSPHERE
1.0000 mL | INTRAVENOUS | Status: AC | PRN
  Administered 2023-09-02: 3 mL via INTRAVENOUS

## 2023-09-02 MED ORDER — SENNOSIDES-DOCUSATE SODIUM 8.6-50 MG PO TABS
1.0000 | ORAL_TABLET | Freq: Two times a day (BID) | ORAL | Status: DC
  Administered 2023-09-02 – 2023-09-04 (×5): 1 via ORAL
  Filled 2023-09-02 (×5): qty 1

## 2023-09-02 MED ORDER — SODIUM CHLORIDE 0.9% IV SOLUTION: Freq: Once | INTRAVENOUS | Status: AC

## 2023-09-02 MED ORDER — POLYETHYLENE GLYCOL 3350 17 G PO PACK
17.0000 g | PACK | Freq: Two times a day (BID) | ORAL | Status: DC
  Administered 2023-09-02 (×2): 17 g via ORAL
  Filled 2023-09-02 (×5): qty 1

## 2023-09-02 NOTE — Progress Notes (Addendum)
PROGRESS NOTE    George Boyer  ONG:[id number] DOB: 1951/08/19 DOA: 09/01/2023 PCP: [name], MD  72/M with history of diastolic CHF, hypertension, dyslipidemia, CAD, type 2 diabetes mellitus, paroxysmal A-fib, CKD 3, OSA, obesity, anxiety, depression, mild cognitive impairment presented to the ED with worsening shortness of breath, swelling and fatigue. -In the ED mildly bradycardic, tachypneic, labs noted potassium of 5.5, BUN of 42, creatinine 2.2, hemoglobin 8.9, BNP 500, troponin 30, TSH normal, chest x-ray with cardiomegaly small pleural effusion and pulmonary edema, right upper quadrant ultrasound with gallstones only, CT abdomen pelvis noted gallstones and mild pericholecystic fluid, pulmonary edema and large stool burden   Subjective: -Feels tired, denies any melena or hematochezia  Assessment and Plan:  Acute on chronic diastolic CHF Aortic stenosis Hypertension -Last echo 5/24 with EF 60%, mild LVH moderate aortic stenosis -Admitted with volume overload suspect primarily triggered by worsening anemia -Continue IV Lasix today -GDMT limited by CKD -Holding irbesartan, amlodipine -Follow-up repeat echo  Severe iron deficiency anemia -Hemoglobin down to 7.7 today, anemia panel with severe iron deficiency -Hemoglobin was normal earlier this year, patient denies melena hematochezia, hematuria -Transfuse 1 unit PRBC today, give IV iron tomorrow -Will need GI workup, remote colonoscopy allegedly unremarkable   Paroxysmal atrial fibrillation Sinus bradycardia -Heart rate in the 45-55 range, remains asymptomatic - Continue amiodarone, ranolazine, Eliquis   AKI on CKD 3 > Creatinine elevated to 2.2 in the setting of presumed CHF exacerbation as above. Baseline creatinine around 1.9, monitor with diuresis, likely cardiorenal   CAD Carotid artery disease > History of coronary stenting - Continue Ranexa, statin, Eliquis  Diabetes > 35U BID at home. - 25U BID, SSI, AM  hypoglycemia, will decrease dose  Constipation Increase stool burden noted on CT Add Senokot and MiraLAX   OSA - Continue home CPAP   Obesity - Noted   MCI - Continue home donepezil   Anxiety Depression - Continue home bupropion  DVT prophylaxis: eliquis Code Status: Full Code Family Communication: None present Disposition Plan: Home pending above workup  Consultants:    Procedures:   Antimicrobials:    Objective: Vitals:   09/02/23 0440 09/02/23 0853 09/02/23 1149 09/02/23 1151  BP: (!) 109/39 (!) 114/38 (!) 110/33 (!) 120/42  Pulse: (!) 48 (!) 49    Resp: 19 16 17    Temp: 98.4 F (36.9 C) 98.2 F (36.8 C) 98.6 F (37 C)   TempSrc: Oral Oral Oral   SpO2: 100% 99%    Weight: 105.6 kg     Height:        Intake/Output Summary (Last 24 hours) at 09/02/2023 1155 Last data filed at 09/02/2023 1100 Gross per 24 hour  Intake 250 ml  Output 3750 ml  Net -3500 ml   Filed Weights   09/01/23 1130 09/02/23 0440  Weight: 109.8 kg 105.6 kg    Examination:  General exam: Chronically ill male appears much older than stated age HEENT: Positive JVD CVS: S1-S2, regular rhythm Lungs: Few basilar Rales, decreased breath sounds to bases Abdomen: Soft, nontender, bowel sounds present Extremities: 1+ edema Skin: No rashes Psychiatry:  Mood & affect appropriate.     Data Reviewed:   CBC: Recent Labs  Lab 09/01/23 1143 09/02/23 0314  WBC 7.4 7.3  HGB 8.9* 7.7*  HCT 27.7* 24.3*  MCV 83.4 82.1  PLT 284 232   Basic Metabolic Panel: Recent Labs  Lab 09/01/23 1143 09/01/23 1223 09/02/23 0314  NA 136  --  139  K 5.5*  --  4.7  CL 107  --  107  CO2 22  --  25  GLUCOSE 177*  --  98  BUN 42*  --  38*  CREATININE 2.20*  --  2.55*  CALCIUM 8.8*  --  8.7*  MG  --  2.3 2.3   GFR: Estimated Creatinine Clearance: 29.9 mL/min (A) (by C-G formula based on SCr of 2.55 mg/dL (H)). Liver Function Tests: Recent Labs  Lab 09/01/23 1143 09/02/23 0314  AST  20 12*  ALT 14 17  ALKPHOS 68 63  BILITOT 0.7 0.8  PROT 7.1 6.1*  ALBUMIN 3.4* 2.7*   Recent Labs  Lab 09/01/23 1223  LIPASE 12   No results for input(s): "AMMONIA" in the last 168 hours. Coagulation Profile: No results for input(s): "INR", "PROTIME" in the last 168 hours. Cardiac Enzymes: No results for input(s): "CKTOTAL", "CKMB", "CKMBINDEX", "TROPONINI" in the last 168 hours. BNP (last 3 results) No results for input(s): "PROBNP" in the last 8760 hours. HbA1C: No results for input(s): "HGBA1C" in the last 72 hours. CBG: Recent Labs  Lab 09/01/23 1733 09/01/23 2059 09/02/23 0555 09/02/23 0622 09/02/23 1144  GLUCAP 119* 167* 68* 90 102*   Lipid Profile: No results for input(s): "CHOL", "HDL", "LDLCALC", "TRIG", "CHOLHDL", "LDLDIRECT" in the last 72 hours. Thyroid Function Tests: Recent Labs    09/01/23 1227  TSH 1.218   Anemia Panel: Recent Labs    09/02/23 0314  FERRITIN 7*  TIBC 371  IRON 13*  RETICCTPCT 1.7   Urine analysis:    Component Value Date/Time   COLORURINE YELLOW 09/01/2023 1352   APPEARANCEUR CLEAR 09/01/2023 1352   LABSPEC 1.008 09/01/2023 1352   PHURINE 5.0 09/01/2023 1352   GLUCOSEU 500 (A) 09/01/2023 1352   HGBUR NEGATIVE 09/01/2023 1352   BILIRUBINUR NEGATIVE 09/01/2023 1352   KETONESUR NEGATIVE 09/01/2023 1352   PROTEINUR NEGATIVE 09/01/2023 1352   NITRITE NEGATIVE 09/01/2023 1352   LEUKOCYTESUR NEGATIVE 09/01/2023 1352   Sepsis Labs: @LABRCNTIP (procalcitonin:4,lacticidven:4)  ) Recent Results (from the past 240 hour(s))  Resp panel by RT-PCR (RSV, Flu A&B, Covid) Anterior Nasal Swab     Status: None   Collection Time: 09/01/23 11:43 AM   Specimen: Anterior Nasal Swab  Result Value Ref Range Status   SARS Coronavirus 2 by RT PCR NEGATIVE NEGATIVE Final    Comment: (NOTE) SARS-CoV-2 target nucleic acids are NOT DETECTED.  The SARS-CoV-2 RNA is generally detectable in upper respiratory specimens during the acute phase of  infection. The lowest concentration of SARS-CoV-2 viral copies this assay can detect is 138 copies/mL. A negative result does not preclude SARS-Cov-2 infection and should not be used as the sole basis for treatment or other patient management decisions. A negative result may occur with  improper specimen collection/handling, submission of specimen other than nasopharyngeal swab, presence of viral mutation(s) within the areas targeted by this assay, and inadequate number of viral copies(<138 copies/mL). A negative result must be combined with clinical observations, patient history, and epidemiological information. The expected result is Negative.  Fact Sheet for Patients:  BloggerCourse.com  Fact Sheet for Healthcare Providers:  SeriousBroker.it  This test is no t yet approved or cleared by the [place] FDA and  has been authorized for detection and/or diagnosis of SARS-CoV-2 by FDA under an Emergency Use Authorization (EUA). This EUA will remain  in effect (meaning this test can be used) for the duration of the COVID-19 declaration under Section 564(b)(1) of the Act, 21  U.S.C.section 360bbb-3(b)(1), unless the authorization is terminated  or revoked sooner.       Influenza A by PCR NEGATIVE NEGATIVE Final   Influenza B by PCR NEGATIVE NEGATIVE Final    Comment: (NOTE) The Xpert Xpress SARS-CoV-2/FLU/RSV plus assay is intended as an aid in the diagnosis of influenza from Nasopharyngeal swab specimens and should not be used as a sole basis for treatment. Nasal washings and aspirates are unacceptable for Xpert Xpress SARS-CoV-2/FLU/RSV testing.  Fact Sheet for Patients: BloggerCourse.com  Fact Sheet for Healthcare Providers: SeriousBroker.it  This test is not yet approved or cleared by the [place] FDA and has been authorized for detection and/or diagnosis of SARS-CoV-2  by FDA under an Emergency Use Authorization (EUA). This EUA will remain in effect (meaning this test can be used) for the duration of the COVID-19 declaration under Section 564(b)(1) of the Act, 21 U.S.C. section 360bbb-3(b)(1), unless the authorization is terminated or revoked.     Resp Syncytial Virus by PCR NEGATIVE NEGATIVE Final    Comment: (NOTE) Fact Sheet for Patients: BloggerCourse.com  Fact Sheet for Healthcare Providers: SeriousBroker.it  This test is not yet approved or cleared by the [place] FDA and has been authorized for detection and/or diagnosis of SARS-CoV-2 by FDA under an Emergency Use Authorization (EUA). This EUA will remain in effect (meaning this test can be used) for the duration of the COVID-19 declaration under Section 564(b)(1) of the Act, 21 U.S.C. section 360bbb-3(b)(1), unless the authorization is terminated or revoked.  Performed at [facility], [address], [place], [place] [address]      Radiology Studies: CT ABDOMEN PELVIS WO CONTRAST  Result Date: 09/01/2023 CLINICAL DATA:  Acute non localized epigastric abdominal and mid back pain. EXAM: CT ABDOMEN AND PELVIS WITHOUT CONTRAST TECHNIQUE: Multidetector CT imaging of the abdomen and pelvis was performed following the standard protocol without IV contrast. RADIATION DOSE REDUCTION: This exam was performed according to the departmental dose-optimization program which includes automated exposure control, adjustment of the mA and/or kV according to patient size and/or use of iterative reconstruction technique. COMPARISON:  None Available. FINDINGS: Lower chest: Limited visualization of the lower thorax demonstrates a trace bilateral pleural effusions, right-greater-than-left. Limited visualization of the lower thorax demonstrates punctate granuloma Normal heart size. Coronary artery calcifications. Calcifications involving the  aortic leaflets. Hepatobiliary: Normal hepatic contour. There are multiple layering gallstones within the gallbladder with potential trace amount of pericholecystic fluid. No ascites. Pancreas: The pancreas is largely fatty replaced. Spleen: The spleen is somewhat atrophic but otherwise normal appearance. Adrenals/Urinary Tract: Normal noncontrast appearance of the bilateral kidneys. Extensive calcifications about the bilateral renal hila are favored to be vascular in etiology. No evidence of nephrolithiasis. There is a 1.9 cm hypoattenuating lesion within the medial aspect of the left kidney (coronal image 41, series 5), which is incompletely characterized on this noncontrast examination though favored to represent a renal cyst. Normal noncontrast appearance of the left kidney. There is a minimal amount of grossly symmetric bilateral perinephric stranding without evidence of urinary obstruction. Normal appearance of the bilateral adrenal glands. Normal appearance of the urinary bladder given degree of distention. Stomach/Bowel: Moderate to large colonic stool burden without evidence of enteric obstruction. Normal appearance of the terminal ileum. The appendix is not visualized however there is no pericecal inflammatory change. Small hiatal hernia. No pneumoperitoneum, pneumatosis or portal venous gas. Vascular/Lymphatic: Scattered atherosclerotic plaque within a normal caliber abdominal aorta. There are extensive thin mural calcifications involving the mesenteric vessels  of the abdomen suggestive of advanced diabetes. No bulky retroperitoneal, mesenteric, pelvic or inguinal lymphadenopathy. Reproductive: Dystrophic calcifications within a normal-sized prostate gland. Trace amount of fluid within the pelvic cul-de-sac. Other: Diffuse body wall anasarca. Musculoskeletal: No acute or aggressive osseous abnormalities. 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 IMPRESSION: 1. Cholelithiasis with  potential trace amount of pericholecystic fluid, nonspecific in the setting of diffuse body wall anasarca though could be seen in the setting of acute cholecystitis. Clinical correlation is advised. Further evaluation with right upper quadrant ultrasound could be performed as indicated. 2. Cardiomegaly with suspected mild pulmonary edema with trace bilateral effusions and diffuse body wall anasarca. 3. Moderate to large colonic stool burden without evidence of enteric obstruction. 4. Extensive thin mural calcifications involving the mesenteric vessels of the abdomen suggestive of advanced diabetes. 5. Aortic Atherosclerosis (ICD10-I70.0). Electronically Signed   By: Simonne Come M.D.   On: 09/01/2023 14:36   US Abdomen Limited RUQ (LIVER/GB)  Result Date: 09/01/2023 CLINICAL DATA:  Right upper quadrant pain for 2 weeks. EXAM: ULTRASOUND ABDOMEN LIMITED RIGHT UPPER QUADRANT COMPARISON:  None Available. FINDINGS: Gallbladder: Gallbladder is mildly distended with numerous layering stones. No wall thickening or adjacent fluid. Common bile duct: Diameter: 3 mm Liver: No focal lesion identified. Within normal limits in parenchymal echogenicity. Portal vein is patent on color Doppler imaging with normal direction of blood flow towards the liver. Other: Small right pleural effusion IMPRESSION: Gallstones. No further sonographic evidence of acute cholecystitis. No ductal dilatation. Small right pleural effusion Electronically Signed   By: Karen Kays M.D.   On: 09/01/2023 13:18   DG Chest Port 1 View  Result Date: 09/01/2023 CLINICAL DATA:  Shortness of breath. EXAM: PORTABLE CHEST 1 VIEW COMPARISON:  06/18/2022 FINDINGS: Unchanged cardiomegaly. Small right pleural effusion. There is slight interstitial thickening that may represent pulmonary edema. No confluent airspace disease. No pneumothorax. IMPRESSION: Cardiomegaly with small right pleural effusion. Slight interstitial thickening that may represent pulmonary  edema. Findings typical of CHF. Electronically Signed   By: Narda Rutherford M.D.   On: 09/01/2023 13:05     Scheduled Meds:  sodium chloride   Intravenous Once   amiodarone  200 mg Oral Daily   apixaban  5 mg Oral BID   donepezil  5 mg Oral QHS   furosemide  40 mg Oral BID   insulin aspart  0-9 Units Subcutaneous TID WC   insulin glargine-yfgn  25 Units Subcutaneous BID   pravastatin  20 mg Oral Daily   ranolazine  500 mg Oral BID   sodium chloride flush  3 mL Intravenous Q12H   Continuous Infusions:   LOS: 0 days    Time spent:    Zannie Cove, MD [facility]   09/02/2023, 11:55 AM

## 2023-09-02 NOTE — Progress Notes (Signed)
   09/02/23 0100  BiPAP/CPAP/SIPAP  $ Non-Invasive Ventilator  Non-Invasive Vent Set Up;Non-Invasive Vent Initial  BiPAP/CPAP/SIPAP Pt Type Adult  BiPAP/CPAP/SIPAP DREAMSTATIOND  Mask Type Full face mask  Mask Size Medium  EPAP 5 cmH2O  Flow Rate 2 lpm

## 2023-09-02 NOTE — Evaluation (Signed)
Occupational Therapy Evaluation Patient Details Name: George Boyer MRN: [id number] DOB: February 12, 1951 Today's Date: 09/02/2023   History of Present Illness Pt is a 72 y.o. male presenting with SOB, BLE edema, and fatigue. [place] abd RUQ revealed gallstones. CT abdomen with cholelithiasis.  DG chest with small R pleural effusion. PMH significant of hypertension, hyperlipidemia, CAD status post stent, diabetes, atrial fibrillation, diastolic CHF, CKD 3, OSA, obesity, anxiety, depression, mild cognitive impairment, anemia, bradycardia, carotid artery disease.   Clinical Impression   PTA, pt lived with his wife and was mod I for ADL; does not drive. Pt endorses one recent fall while trying to pick up his dog. Upon eval, pt following 1-2 step commands, performing UB ADL mod I, and LB Adl distant supervision. Pt with fair but decreased activity tolerance. Will follow for one more session to optimize activity tolerance and provide education. Recommending discharge home with no OT follow up at this time.       If plan is discharge home, recommend the following: A little help with walking and/or transfers;A little help with bathing/dressing/bathroom;Assistance with cooking/housework;Assist for transportation;Help with stairs or ramp for entrance;Direct supervision/assist for medications management;Direct supervision/assist for financial management    Functional Status Assessment  Patient has had a recent decline in their functional status and demonstrates the ability to make significant improvements in function in a reasonable and predictable amount of time.  Equipment Recommendations  None recommended by OT    Recommendations for Other Services       Precautions / Restrictions Precautions Precautions: Fall Restrictions Weight Bearing Restrictions: No      Mobility Bed Mobility Overal bed mobility: Needs Assistance Bed Mobility: Supine to Sit     Supine to sit: Contact guard     General bed  mobility comments: heeavy use of momentum for truncal elevation; no hands on assist needed    Transfers Overall transfer level: Needs assistance Equipment used: Rolling walker (2 wheels) Transfers: Sit to/from Stand Sit to Stand: Supervision           General transfer comment: distant supervision; one cue for hand placement      Balance Overall balance assessment: Needs assistance Sitting-balance support: No upper extremity supported, Feet supported Sitting balance-Leahy Scale: Good     Standing balance support: During functional activity, Bilateral upper extremity supported Standing balance-Leahy Scale: Fair Standing balance comment: reliant on RW dynamically                           ADL either performed or assessed with clinical judgement   ADL Overall ADL's : Needs assistance/impaired Eating/Feeding: Independent   Grooming: Standing;Contact guard assist   Upper Body Bathing: Independent;Sitting   Lower Body Bathing: Supervison/ safety;Sit to/from stand   Upper Body Dressing : Independent;Sitting   Lower Body Dressing: Supervision/safety;Sit to/from stand   Toilet Transfer: Supervision/safety;Ambulation;Rolling walker (2 wheels)   Toileting- Clothing Manipulation and Hygiene: Sitting/lateral lean;Supervision/safety;Sit to/from stand       Functional mobility during ADLs: Supervision/safety;Rolling walker (2 wheels)       Vision Ability to See in Adequate Light: 0 Adequate Patient Visual Report: No change from baseline Vision Assessment?: Wears glasses for reading     Perception Perception: Not tested       Praxis Praxis: Not tested       Pertinent Vitals/Pain Pain Assessment Pain Assessment: 0-10 Pain Score: 8  Pain Location: L leg Pain Descriptors / Indicators: Discomfort Pain Intervention(s): Limited activity within  patient's tolerance, Monitored during session     Extremity/Trunk Assessment Upper Extremity Assessment Upper  Extremity Assessment: Generalized weakness   Lower Extremity Assessment Lower Extremity Assessment: Defer to PT evaluation   Cervical / Trunk Assessment Cervical / Trunk Assessment: Kyphotic   Communication Communication Communication: No apparent difficulties   Cognition Arousal: Alert Behavior During Therapy: WFL for tasks assessed/performed Overall Cognitive Status: No family/caregiver present to determine baseline cognitive functioning                                 General Comments: WFL for basic functional tasks (ADL). Oriented, following 1-2 step commands without difficulty. Per chart, pt with mild cognitive impairment at baseline.     General Comments  SpO2 reading as low as 85% during mobility, but poor pleth, pt with no signs or symptoms of difficulty breathing, denies shortness of breath reading at 93% SpO2 when not using UE to support self on walker    Exercises     Shoulder Instructions      Home Living Family/patient expects to be discharged to:: Private residence Living Arrangements: Spouse/significant other;Children Available Help at Discharge: Family;Available PRN/intermittently Type of Home: House Home Access: Stairs to enter [employer] of Steps: 3 Entrance Stairs-Rails: Right Home Layout: Two level;Able to live on main level with bedroom/bathroom Alternate Level Stairs-Number of Steps: 15 Alternate Level Stairs-Rails: Right Bathroom Shower/Tub: Producer, television/film/video: Standard Bathroom Accessibility: Yes   Home Equipment: Shower seat - built Charity fundraiser (2 wheels);Grab bars - tub/shower          Prior Functioning/Environment Prior Level of Function : History of Falls (last six months);Independent/Modified Independent;Working/employed             Mobility Comments: Pt reports that he occasionally uses RW but mostly walks without it. ADLs Comments: Ind, does not drive        OT Problem List:  Decreased strength;Decreased activity tolerance;Impaired balance (sitting and/or standing)      OT Treatment/Interventions: Self-care/ADL training;Therapeutic exercise;DME and/or AE instruction;Balance training;Patient/family education;Therapeutic activities    OT Goals(Current goals can be found in the care plan section) Acute Rehab OT Goals Patient Stated Goal: go home OT Goal Formulation: With patient Time For Goal Achievement: 09/16/23 Potential to Achieve Goals: Good  OT Frequency: Min 1X/week    Co-evaluation              AM-PAC OT "6 Clicks" Daily Activity     Outcome Measure Help from another person eating meals?: None Help from another person taking care of personal grooming?: A Little Help from another person toileting, which includes using toliet, bedpan, or urinal?: A Little Help from another person bathing (including washing, rinsing, drying)?: A Little Help from another person to put on and taking off regular upper body clothing?: None Help from another person to put on and taking off regular lower body clothing?: A Little 6 Click Score: 20   End of Session Equipment Utilized During Treatment: Gait belt;Rolling walker (2 wheels) Nurse Communication: Mobility status  Activity Tolerance: Patient tolerated treatment well Patient left: in chair;with call bell/phone within reach;with chair alarm set (PT in room)  OT Visit Diagnosis: Unsteadiness on feet (R26.81);Muscle weakness (generalized) (M62.81);History of falling (Z91.81)                Time: 0347-4259 OT Time Calculation (min): 14 min Charges:  OT General Charges $OT Visit: 1 Visit OT  Evaluation $OT Eval Low Complexity: 1 Low  Tyler Deis, OTR/L [facility] Acute Rehabilitation Office: [phone]   Myrla Halsted 09/02/2023, 10:35 AM

## 2023-09-02 NOTE — Care Management Obs Status (Signed)
MEDICARE OBSERVATION STATUS NOTIFICATION   Patient Details  Name: George Boyer MRN: [id number] Date of Birth: December 11, 1950   Medicare Observation Status Notification Given:  Yes    Lawerance Sabal, RN 09/02/2023, 3:51 PM

## 2023-09-02 NOTE — Plan of Care (Signed)
Problem: Education: Goal: Knowledge of General Education information will improve Description Including pain rating scale, medication(s)/side effects and non-pharmacologic comfort measures Outcome: Progressing   Problem: Clinical Measurements: Goal: Will remain free from infection Outcome: Progressing  Goal: Diagnostic test results will improve Outcome: Progressing Goal: Respiratory complications will improve Outcome: Progressing Goal: Cardiovascular complication will be avoided Outcome: Progressing   Problem: Activity: Goal: Risk for activity intolerance will decrease Outcome: Progressing

## 2023-09-02 NOTE — Progress Notes (Signed)
Hypoglycemic Event  CBG: 68  Treatment: 4 oz juice/soda  Symptoms: None  Follow-up CBG: [id number] CBG Result:90  Possible Reasons for Event: Unknown  Comments/MD notified:Hypoglycemia protocol interventions successful no need to notify MD at this time    George Boyer

## 2023-09-02 NOTE — Evaluation (Signed)
Physical Therapy Brief Evaluation and Discharge Note Patient Details Name: George Boyer MRN: [id number] DOB: 1951/07/12 Today's Date: 09/02/2023   History of Present Illness  Pt is a 72 y.o. male presenting with SOB, BLE edema, and fatigue. [place] abd RUQ revealed gallstones. CT abdomen with cholelithiasis.  DG chest with small R pleural effusion. PMH significant of hypertension, hyperlipidemia, CAD status post stent, diabetes, atrial fibrillation, diastolic CHF, CKD 3, OSA, obesity, anxiety, depression, mild cognitive impairment, anemia, bradycardia, carotid artery disease.  Clinical Impression  Pt presents with admitting diagnosis above. Pt today able to ambulate in hallway and navigate stairs with CGA/Supervision. Pt presents at or near baseline mobility. Pt has no further acute PT needs and will be signing off. Pt would benefit from continued mobility with mobility specialist.      PT Assessment Patient does not need any further PT services  Assistance Needed at Discharge  PRN    Equipment Recommendations None recommended by PT  Recommendations for Other Services       Precautions/Restrictions Precautions Precautions: Fall Restrictions Weight Bearing Restrictions: No        Mobility  Bed Mobility   Supine/Sidelying to sit: Supervision   General bed mobility comments: Reliant on momentum  Transfers Overall transfer level: Needs assistance Equipment used: Rolling walker (2 wheels) Transfers: Sit to/from Stand Sit to Stand: Supervision           General transfer comment: distant supervision; one cue for hand placement    Ambulation/Gait Ambulation/Gait assistance: Contact guard assist Gait Distance (Feet): 150 Feet Assistive device: Rolling walker (2 wheels) Gait Pattern/deviations: WFL(Within Functional Limits) Gait Speed: Pace WFL General Gait Details: no LOB noted.  Home Activity Instructions    Stairs Stairs: Yes Stairs assistance: Contact guard  assist Stair Management: Two rails, Step to pattern, Forwards Number of Stairs: 3 General stair comments: no LOB noted.  Modified Rankin (Stroke Patients Only)        Balance Overall balance assessment: Needs assistance Sitting-balance support: No upper extremity supported, Feet supported Sitting balance-Leahy Scale: Good     Standing balance support: During functional activity, Bilateral upper extremity supported Standing balance-Leahy Scale: Fair Standing balance comment: reliant on RW dynamically          Pertinent Vitals/Pain PT - Brief Vital Signs All Vital Signs Stable: Yes Pain Assessment Pain Assessment: 0-10 Pain Score: 8  Pain Location: L leg Pain Descriptors / Indicators: Discomfort Pain Intervention(s): Monitored during session     Home Living Family/patient expects to be discharged to:: Private residence Living Arrangements: Spouse/significant other;Children Available Help at Discharge: Family;Available PRN/intermittently Home Environment: Stairs to enter;Rail - right  Stairs-Number of Steps: 3 Home Equipment: Shower seat - built [employer] (2 wheels);Grab bars - tub/shower        Prior Function Level of Independence: Independent      UE/LE Assessment   UE ROM/Strength/Tone/Coordination: WFL    LE ROM/Strength/Tone/Coordination: [facility]      Communication   Communication Communication: No apparent difficulties     Cognition Overall Cognitive Status: Appears within functional limits for tasks assessed/performed       General Comments General comments (skin integrity, edema, etc.): SpO2 reading as low as 85% during mobility, but poor pleth, pt with no signs or symptoms of difficulty breathing, denies shortness of breath reading at 93% SpO2 when not using UE to support self on walker    Exercises     Assessment/Plan    PT Problem List  PT Visit Diagnosis Other abnormalities of gait and mobility (R26.89)    No Skilled  PT Patient at baseline level of functioning   Co-evaluation                AMPAC 6 Clicks Help needed turning from your back to your side while in a flat bed without using bedrails?: A Little Help needed moving from lying on your back to sitting on the side of a flat bed without using bedrails?: A Little Help needed moving to and from a bed to a chair (including a wheelchair)?: A Little Help needed standing up from a chair using your arms (e.g., wheelchair or bedside chair)?: A Little Help needed to walk in hospital room?: A Little Help needed climbing 3-5 steps with a railing? : A Little 6 Click Score: 18      End of Session Equipment Utilized During Treatment: Gait belt Activity Tolerance: Patient tolerated treatment well Patient left: in chair;with call bell/phone within reach;with chair alarm set Nurse Communication: Mobility status PT Visit Diagnosis: Other abnormalities of gait and mobility (R26.89)     Time: 4098-1191 PT Time Calculation (min) (ACUTE ONLY): 15 min  Charges:   PT Evaluation $PT Eval Low Complexity: 1 Low      George Boyer, PT, DPT Acute Rehab Services [phone]   Gladys Damme  09/02/2023, 12:59 PM

## 2023-09-03 DIAGNOSIS — I5033 Acute on chronic diastolic (congestive) heart failure: Secondary | ICD-10-CM | POA: Diagnosis not present

## 2023-09-03 LAB — COMPREHENSIVE METABOLIC PANEL
ALT: 19 U/L (ref 0–44)
AST: 16 U/L (ref 15–41)
Albumin: 3.2 g/dL — ABNORMAL LOW (ref 3.5–5.0)
Alkaline Phosphatase: 71 U/L (ref 38–126)
Anion gap: 11 (ref 5–15)
BUN: 34 mg/dL — ABNORMAL HIGH (ref 8–23)
CO2: 25 mmol/L (ref 22–32)
Calcium: 9.1 mg/dL (ref 8.9–10.3)
Chloride: 102 mmol/L (ref 98–111)
Creatinine, Ser: 2.38 mg/dL — ABNORMAL HIGH (ref 0.61–1.24)
GFR, Estimated: 28 mL/min — ABNORMAL LOW (ref >60)
Glucose, Bld: 72 mg/dL (ref 70–99)
Potassium: 4.2 mmol/L (ref 3.5–5.1)
Sodium: 138 mmol/L (ref 135–145)
Total Bilirubin: 1 mg/dL (ref 0.3–1.2)
Total Protein: 7.1 g/dL (ref 6.5–8.1)

## 2023-09-03 LAB — BPAM RBC
Blood Product Expiration Date: 202411052359
ISSUE DATE / TIME: 202410121509
Unit Type and Rh: 5100

## 2023-09-03 LAB — TYPE AND SCREEN
ABO/RH(D): O POS
Antibody Screen: NEGATIVE
Unit division: 0

## 2023-09-03 LAB — CBC
HCT: 31.5 % — ABNORMAL LOW (ref 39.0–52.0)
Hemoglobin: 10.1 g/dL — ABNORMAL LOW (ref 13.0–17.0)
MCH: 26 pg (ref 26.0–34.0)
MCHC: 32.1 g/dL (ref 30.0–36.0)
MCV: 81 fL (ref 80.0–100.0)
Platelets: 259 10*3/uL (ref 150–400)
RBC: 3.89 MIL/uL — ABNORMAL LOW (ref 4.22–5.81)
RDW: 16.1 % — ABNORMAL HIGH (ref 11.5–15.5)
WBC: 6.4 10*3/uL (ref 4.0–10.5)
nRBC: 0 % (ref 0.0–0.2)

## 2023-09-03 LAB — GLUCOSE, CAPILLARY
Glucose-Capillary: 65 mg/dL — ABNORMAL LOW (ref 70–99)
Glucose-Capillary: 106 mg/dL — ABNORMAL HIGH (ref 70–99)
Glucose-Capillary: 108 mg/dL — ABNORMAL HIGH (ref 70–99)
Glucose-Capillary: 118 mg/dL — ABNORMAL HIGH (ref 70–99)
Glucose-Capillary: 135 mg/dL — ABNORMAL HIGH (ref 70–99)

## 2023-09-03 MED ORDER — INSULIN GLARGINE-YFGN 100 UNIT/ML ~~LOC~~ SOLN
15.0000 [IU] | Freq: Two times a day (BID) | SUBCUTANEOUS | Status: DC
  Administered 2023-09-03 – 2023-09-04 (×2): 15 [IU] via SUBCUTANEOUS
  Filled 2023-09-03 (×4): qty 0.15

## 2023-09-03 MED ORDER — SODIUM CHLORIDE 0.9 % IV SOLN
100.0000 mg | Freq: Once | INTRAVENOUS | Status: AC
  Administered 2023-09-03: 100 mg via INTRAVENOUS
  Filled 2023-09-03: qty 5

## 2023-09-03 NOTE — Plan of Care (Signed)
  Problem: Education: Goal: Knowledge of General Education information will improve Description: Including pain rating scale, medication(s)/side effects and non-pharmacologic comfort measures Outcome: Progressing   Problem: Health Behavior/Discharge Planning: Goal: Ability to manage health-related needs will improve Outcome: Progressing   Problem: Clinical Measurements: Goal: Ability to maintain clinical measurements within normal limits will improve Outcome: Progressing Goal: Will remain free from infection Outcome: Progressing Goal: Diagnostic test results will improve Outcome: Progressing Goal: Respiratory complications will improve Outcome: Progressing Goal: Cardiovascular complication will be avoided Outcome: Progressing   Problem: Activity: Goal: Risk for activity intolerance will decrease Outcome: Progressing   Problem: Nutrition: Goal: Adequate nutrition will be maintained Outcome: Progressing   Problem: Coping: Goal: Level of anxiety will decrease Outcome: Progressing   Problem: Elimination: Goal: Will not experience complications related to bowel motility Outcome: Progressing Goal: Will not experience complications related to urinary retention Outcome: Progressing   Problem: Pain Managment: Goal: General experience of comfort will improve Outcome: Progressing   Problem: Safety: Goal: Ability to remain free from injury will improve Outcome: Progressing   Problem: Skin Integrity: Goal: Risk for impaired skin integrity will decrease Outcome: Progressing   Problem: Education: Goal: Ability to demonstrate management of disease process will improve Outcome: Progressing Goal: Ability to verbalize understanding of medication therapies will improve Outcome: Progressing Goal: Individualized Educational Video(s) Outcome: Progressing   Problem: Activity: Goal: Capacity to carry out activities will improve Outcome: Progressing   Problem: Cardiac: Goal:  Ability to achieve and maintain adequate cardiopulmonary perfusion will improve Outcome: Progressing   Problem: Education: Goal: Ability to demonstrate management of disease process will improve Outcome: Progressing Goal: Ability to verbalize understanding of medication therapies will improve Outcome: Progressing Goal: Individualized Educational Video(s) Outcome: Progressing   Problem: Activity: Goal: Capacity to carry out activities will improve Outcome: Progressing   Problem: Cardiac: Goal: Ability to achieve and maintain adequate cardiopulmonary perfusion will improve Outcome: Progressing   Problem: Education: Goal: Ability to describe self-care measures that may prevent or decrease complications (Diabetes Survival Skills Education) will improve Outcome: Progressing Goal: Individualized Educational Video(s) Outcome: Progressing   Problem: Coping: Goal: Ability to adjust to condition or change in health will improve Outcome: Progressing   Problem: Fluid Volume: Goal: Ability to maintain a balanced intake and output will improve Outcome: Progressing   Problem: Health Behavior/Discharge Planning: Goal: Ability to identify and utilize available resources and services will improve Outcome: Progressing Goal: Ability to manage health-related needs will improve Outcome: Progressing   Problem: Metabolic: Goal: Ability to maintain appropriate glucose levels will improve Outcome: Progressing   Problem: Nutritional: Goal: Maintenance of adequate nutrition will improve Outcome: Progressing Goal: Progress toward achieving an optimal weight will improve Outcome: Progressing   Problem: Skin Integrity: Goal: Risk for impaired skin integrity will decrease Outcome: Progressing   Problem: Tissue Perfusion: Goal: Adequacy of tissue perfusion will improve Outcome: Progressing

## 2023-09-03 NOTE — Progress Notes (Signed)
Hypoglycemic Event  CBG: 65  Treatment: 4 oz juice/soda  Symptoms: None  Follow-up CBG: Time:0545 CBG Result:106  Possible Reasons for Event: Unknown  Comments/MD notified:Hypoglycemia protocol interventions successful no need to notify MD at this time     George Boyer

## 2023-09-03 NOTE — Progress Notes (Signed)
PROGRESS NOTE    George Boyer  VWU:[id number] DOB: 01-07-1951 DOA: 09/01/2023 PCP: [name], MD  72/M with history of diastolic CHF, hypertension, dyslipidemia, CAD, type 2 diabetes mellitus, paroxysmal A-fib, CKD 3, OSA, obesity, anxiety, depression, mild cognitive impairment presented to the ED with worsening shortness of breath, swelling and fatigue. -In the ED mildly bradycardic, tachypneic, labs noted potassium of 5.5, BUN of 42, creatinine 2.2, hemoglobin 8.9, BNP 500, troponin 30, TSH normal, chest x-ray with cardiomegaly small pleural effusion and pulmonary edema, right upper quadrant ultrasound with gallstones only, CT abdomen pelvis noted gallstones and mild pericholecystic fluid, pulmonary edema and large stool burden -Admitted, started on diuretics, transfuse 1 unit PRBC  Subjective: -Still tired but overall improving  Assessment and Plan:  Acute on chronic diastolic CHF Aortic stenosis Hypertension -Last echo 5/24 with EF 60%, mild LVH moderate aortic stenosis -Repeat echo with EF 60-65%, normal RV, mild to moderate aortic stenosis -Admitted with volume overload suspect primarily triggered by worsening anemia -Continue IV Lasix 1 more day, he is 5.1 L negative -GDMT limited by CKD -Holding irbesartan, amlodipine  Severe iron deficiency anemia -Hemoglobin down to 7.7 on 10/12, anemia panel with severe iron deficiency -Hemoglobin was normal earlier this year, patient denies melena hematochezia, hematuria -Trend is 1 unit PRBC yesterday, give IV iron today -Will need GI workup, remote colonoscopy allegedly unremarkable, will send urgent GI referral at discharge   Paroxysmal atrial fibrillation Sinus bradycardia -Heart rate in the 45-55 range, remains asymptomatic - Continue amiodarone, ranolazine, Eliquis   AKI on CKD 3 -Baseline creatinine around 1.9,  -peaked at 2.5, slowly improving, cardiorenal, monitor    CAD Carotid artery disease > History of coronary  stenting - Continue Ranexa, statin, Eliquis  Diabetes > 35U BID at home. -Will decrease insulin dose further, a.m. hypoglycemia  Constipation Increase stool burden noted on CT -Continue Senokot and MiraLAX   OSA - Continue home CPAP   Obesity - Noted   MCI - Continue home donepezil   Anxiety Depression - Continue home bupropion  DVT prophylaxis: eliquis Code Status: Full Code Family Communication: None present Disposition Plan: Home pending above workup  Consultants:    Procedures:   Antimicrobials:    Objective: Vitals:   09/02/23 2015 09/03/23 0033 09/03/23 0504 09/03/23 0726  BP: (!) 124/35 (!) 130/40 (!) 117/34 (!) 114/31  Pulse: (!) 51 (!) 52 (!) 54 (!) 51  Resp: 19 18  18   Temp: 98.6 F (37 C) 98.2 F (36.8 C) 98.8 F (37.1 C) 98.2 F (36.8 C)  TempSrc: Oral Oral Oral Oral  SpO2: 98% 97% 99% 98%  Weight:   102.6 kg   Height:        Intake/Output Summary (Last 24 hours) at 09/03/2023 1123 Last data filed at 09/03/2023 1053 Gross per 24 hour  Intake 725 ml  Output 2900 ml  Net -2175 ml   Filed Weights   09/01/23 1130 09/02/23 0440 09/03/23 0504  Weight: 109.8 kg 105.6 kg 102.6 kg    Examination:  General exam: Chronically ill elderly male laying in bed, AAOx3 HEENT: Positive JVD CVS: S1-S2, regular rhythm Lungs: Few basilar rales Abdomen: Soft, nontender, bowel sounds present Extremities: 1+ edema  Skin: No rashes Psychiatry:  Mood & affect appropriate.     Data Reviewed:   CBC: Recent Labs  Lab 09/01/23 1143 09/02/23 0314 09/03/23 0356  WBC 7.4 7.3 6.4  HGB 8.9* 7.7* 10.1*  HCT 27.7* 24.3* 31.5*  MCV 83.4 82.1  PROGRESS NOTE    George Boyer  VWU:[id number] DOB: 01-07-1951 DOA: 09/01/2023 PCP: [name], MD  72/M with history of diastolic CHF, hypertension, dyslipidemia, CAD, type 2 diabetes mellitus, paroxysmal A-fib, CKD 3, OSA, obesity, anxiety, depression, mild cognitive impairment presented to the ED with worsening shortness of breath, swelling and fatigue. -In the ED mildly bradycardic, tachypneic, labs noted potassium of 5.5, BUN of 42, creatinine 2.2, hemoglobin 8.9, BNP 500, troponin 30, TSH normal, chest x-ray with cardiomegaly small pleural effusion and pulmonary edema, right upper quadrant ultrasound with gallstones only, CT abdomen pelvis noted gallstones and mild pericholecystic fluid, pulmonary edema and large stool burden -Admitted, started on diuretics, transfuse 1 unit PRBC  Subjective: -Still tired but overall improving  Assessment and Plan:  Acute on chronic diastolic CHF Aortic stenosis Hypertension -Last echo 5/24 with EF 60%, mild LVH moderate aortic stenosis -Repeat echo with EF 60-65%, normal RV, mild to moderate aortic stenosis -Admitted with volume overload suspect primarily triggered by worsening anemia -Continue IV Lasix 1 more day, he is 5.1 L negative -GDMT limited by CKD -Holding irbesartan, amlodipine  Severe iron deficiency anemia -Hemoglobin down to 7.7 on 10/12, anemia panel with severe iron deficiency -Hemoglobin was normal earlier this year, patient denies melena hematochezia, hematuria -Trend is 1 unit PRBC yesterday, give IV iron today -Will need GI workup, remote colonoscopy allegedly unremarkable, will send urgent GI referral at discharge   Paroxysmal atrial fibrillation Sinus bradycardia -Heart rate in the 45-55 range, remains asymptomatic - Continue amiodarone, ranolazine, Eliquis   AKI on CKD 3 -Baseline creatinine around 1.9,  -peaked at 2.5, slowly improving, cardiorenal, monitor    CAD Carotid artery disease > History of coronary  stenting - Continue Ranexa, statin, Eliquis  Diabetes > 35U BID at home. -Will decrease insulin dose further, a.m. hypoglycemia  Constipation Increase stool burden noted on CT -Continue Senokot and MiraLAX   OSA - Continue home CPAP   Obesity - Noted   MCI - Continue home donepezil   Anxiety Depression - Continue home bupropion  DVT prophylaxis: eliquis Code Status: Full Code Family Communication: None present Disposition Plan: Home pending above workup  Consultants:    Procedures:   Antimicrobials:    Objective: Vitals:   09/02/23 2015 09/03/23 0033 09/03/23 0504 09/03/23 0726  BP: (!) 124/35 (!) 130/40 (!) 117/34 (!) 114/31  Pulse: (!) 51 (!) 52 (!) 54 (!) 51  Resp: 19 18  18   Temp: 98.6 F (37 C) 98.2 F (36.8 C) 98.8 F (37.1 C) 98.2 F (36.8 C)  TempSrc: Oral Oral Oral Oral  SpO2: 98% 97% 99% 98%  Weight:   102.6 kg   Height:        Intake/Output Summary (Last 24 hours) at 09/03/2023 1123 Last data filed at 09/03/2023 1053 Gross per 24 hour  Intake 725 ml  Output 2900 ml  Net -2175 ml   Filed Weights   09/01/23 1130 09/02/23 0440 09/03/23 0504  Weight: 109.8 kg 105.6 kg 102.6 kg    Examination:  General exam: Chronically ill elderly male laying in bed, AAOx3 HEENT: Positive JVD CVS: S1-S2, regular rhythm Lungs: Few basilar rales Abdomen: Soft, nontender, bowel sounds present Extremities: 1+ edema  Skin: No rashes Psychiatry:  Mood & affect appropriate.     Data Reviewed:   CBC: Recent Labs  Lab 09/01/23 1143 09/02/23 0314 09/03/23 0356  WBC 7.4 7.3 6.4  HGB 8.9* 7.7* 10.1*  HCT 27.7* 24.3* 31.5*  MCV 83.4 82.1  PROGRESS NOTE    George Boyer  VWU:[id number] DOB: 01-07-1951 DOA: 09/01/2023 PCP: [name], MD  72/M with history of diastolic CHF, hypertension, dyslipidemia, CAD, type 2 diabetes mellitus, paroxysmal A-fib, CKD 3, OSA, obesity, anxiety, depression, mild cognitive impairment presented to the ED with worsening shortness of breath, swelling and fatigue. -In the ED mildly bradycardic, tachypneic, labs noted potassium of 5.5, BUN of 42, creatinine 2.2, hemoglobin 8.9, BNP 500, troponin 30, TSH normal, chest x-ray with cardiomegaly small pleural effusion and pulmonary edema, right upper quadrant ultrasound with gallstones only, CT abdomen pelvis noted gallstones and mild pericholecystic fluid, pulmonary edema and large stool burden -Admitted, started on diuretics, transfuse 1 unit PRBC  Subjective: -Still tired but overall improving  Assessment and Plan:  Acute on chronic diastolic CHF Aortic stenosis Hypertension -Last echo 5/24 with EF 60%, mild LVH moderate aortic stenosis -Repeat echo with EF 60-65%, normal RV, mild to moderate aortic stenosis -Admitted with volume overload suspect primarily triggered by worsening anemia -Continue IV Lasix 1 more day, he is 5.1 L negative -GDMT limited by CKD -Holding irbesartan, amlodipine  Severe iron deficiency anemia -Hemoglobin down to 7.7 on 10/12, anemia panel with severe iron deficiency -Hemoglobin was normal earlier this year, patient denies melena hematochezia, hematuria -Trend is 1 unit PRBC yesterday, give IV iron today -Will need GI workup, remote colonoscopy allegedly unremarkable, will send urgent GI referral at discharge   Paroxysmal atrial fibrillation Sinus bradycardia -Heart rate in the 45-55 range, remains asymptomatic - Continue amiodarone, ranolazine, Eliquis   AKI on CKD 3 -Baseline creatinine around 1.9,  -peaked at 2.5, slowly improving, cardiorenal, monitor    CAD Carotid artery disease > History of coronary  stenting - Continue Ranexa, statin, Eliquis  Diabetes > 35U BID at home. -Will decrease insulin dose further, a.m. hypoglycemia  Constipation Increase stool burden noted on CT -Continue Senokot and MiraLAX   OSA - Continue home CPAP   Obesity - Noted   MCI - Continue home donepezil   Anxiety Depression - Continue home bupropion  DVT prophylaxis: eliquis Code Status: Full Code Family Communication: None present Disposition Plan: Home pending above workup  Consultants:    Procedures:   Antimicrobials:    Objective: Vitals:   09/02/23 2015 09/03/23 0033 09/03/23 0504 09/03/23 0726  BP: (!) 124/35 (!) 130/40 (!) 117/34 (!) 114/31  Pulse: (!) 51 (!) 52 (!) 54 (!) 51  Resp: 19 18  18   Temp: 98.6 F (37 C) 98.2 F (36.8 C) 98.8 F (37.1 C) 98.2 F (36.8 C)  TempSrc: Oral Oral Oral Oral  SpO2: 98% 97% 99% 98%  Weight:   102.6 kg   Height:        Intake/Output Summary (Last 24 hours) at 09/03/2023 1123 Last data filed at 09/03/2023 1053 Gross per 24 hour  Intake 725 ml  Output 2900 ml  Net -2175 ml   Filed Weights   09/01/23 1130 09/02/23 0440 09/03/23 0504  Weight: 109.8 kg 105.6 kg 102.6 kg    Examination:  General exam: Chronically ill elderly male laying in bed, AAOx3 HEENT: Positive JVD CVS: S1-S2, regular rhythm Lungs: Few basilar rales Abdomen: Soft, nontender, bowel sounds present Extremities: 1+ edema  Skin: No rashes Psychiatry:  Mood & affect appropriate.     Data Reviewed:   CBC: Recent Labs  Lab 09/01/23 1143 09/02/23 0314 09/03/23 0356  WBC 7.4 7.3 6.4  HGB 8.9* 7.7* 10.1*  HCT 27.7* 24.3* 31.5*  MCV 83.4 82.1  PROGRESS NOTE    George Boyer  VWU:[id number] DOB: 01-07-1951 DOA: 09/01/2023 PCP: [name], MD  72/M with history of diastolic CHF, hypertension, dyslipidemia, CAD, type 2 diabetes mellitus, paroxysmal A-fib, CKD 3, OSA, obesity, anxiety, depression, mild cognitive impairment presented to the ED with worsening shortness of breath, swelling and fatigue. -In the ED mildly bradycardic, tachypneic, labs noted potassium of 5.5, BUN of 42, creatinine 2.2, hemoglobin 8.9, BNP 500, troponin 30, TSH normal, chest x-ray with cardiomegaly small pleural effusion and pulmonary edema, right upper quadrant ultrasound with gallstones only, CT abdomen pelvis noted gallstones and mild pericholecystic fluid, pulmonary edema and large stool burden -Admitted, started on diuretics, transfuse 1 unit PRBC  Subjective: -Still tired but overall improving  Assessment and Plan:  Acute on chronic diastolic CHF Aortic stenosis Hypertension -Last echo 5/24 with EF 60%, mild LVH moderate aortic stenosis -Repeat echo with EF 60-65%, normal RV, mild to moderate aortic stenosis -Admitted with volume overload suspect primarily triggered by worsening anemia -Continue IV Lasix 1 more day, he is 5.1 L negative -GDMT limited by CKD -Holding irbesartan, amlodipine  Severe iron deficiency anemia -Hemoglobin down to 7.7 on 10/12, anemia panel with severe iron deficiency -Hemoglobin was normal earlier this year, patient denies melena hematochezia, hematuria -Trend is 1 unit PRBC yesterday, give IV iron today -Will need GI workup, remote colonoscopy allegedly unremarkable, will send urgent GI referral at discharge   Paroxysmal atrial fibrillation Sinus bradycardia -Heart rate in the 45-55 range, remains asymptomatic - Continue amiodarone, ranolazine, Eliquis   AKI on CKD 3 -Baseline creatinine around 1.9,  -peaked at 2.5, slowly improving, cardiorenal, monitor    CAD Carotid artery disease > History of coronary  stenting - Continue Ranexa, statin, Eliquis  Diabetes > 35U BID at home. -Will decrease insulin dose further, a.m. hypoglycemia  Constipation Increase stool burden noted on CT -Continue Senokot and MiraLAX   OSA - Continue home CPAP   Obesity - Noted   MCI - Continue home donepezil   Anxiety Depression - Continue home bupropion  DVT prophylaxis: eliquis Code Status: Full Code Family Communication: None present Disposition Plan: Home pending above workup  Consultants:    Procedures:   Antimicrobials:    Objective: Vitals:   09/02/23 2015 09/03/23 0033 09/03/23 0504 09/03/23 0726  BP: (!) 124/35 (!) 130/40 (!) 117/34 (!) 114/31  Pulse: (!) 51 (!) 52 (!) 54 (!) 51  Resp: 19 18  18   Temp: 98.6 F (37 C) 98.2 F (36.8 C) 98.8 F (37.1 C) 98.2 F (36.8 C)  TempSrc: Oral Oral Oral Oral  SpO2: 98% 97% 99% 98%  Weight:   102.6 kg   Height:        Intake/Output Summary (Last 24 hours) at 09/03/2023 1123 Last data filed at 09/03/2023 1053 Gross per 24 hour  Intake 725 ml  Output 2900 ml  Net -2175 ml   Filed Weights   09/01/23 1130 09/02/23 0440 09/03/23 0504  Weight: 109.8 kg 105.6 kg 102.6 kg    Examination:  General exam: Chronically ill elderly male laying in bed, AAOx3 HEENT: Positive JVD CVS: S1-S2, regular rhythm Lungs: Few basilar rales Abdomen: Soft, nontender, bowel sounds present Extremities: 1+ edema  Skin: No rashes Psychiatry:  Mood & affect appropriate.     Data Reviewed:   CBC: Recent Labs  Lab 09/01/23 1143 09/02/23 0314 09/03/23 0356  WBC 7.4 7.3 6.4  HGB 8.9* 7.7* 10.1*  HCT 27.7* 24.3* 31.5*  MCV 83.4 82.1  the [employer] and  has been authorized for detection and/or diagnosis of SARS-CoV-2 by FDA under an Emergency Use Authorization (EUA). This EUA will remain  in effect (meaning this test can be used) for the duration of the COVID-19 declaration under Section 564(b)(1) of the Act, 21 U.S.C.section 360bbb-3(b)(1), unless the authorization is terminated  or revoked sooner.       Influenza A by PCR NEGATIVE NEGATIVE Final   Influenza B by PCR NEGATIVE NEGATIVE Final    Comment: (NOTE) The Xpert Xpress SARS-CoV-2/FLU/RSV plus assay is intended as an aid in the diagnosis of influenza from Nasopharyngeal swab specimens and should not be used as a sole basis for treatment. Nasal washings and aspirates are unacceptable for Xpert Xpress SARS-CoV-2/FLU/RSV testing.  Fact Sheet for  Patients: BloggerCourse.com  Fact Sheet for Healthcare Providers: SeriousBroker.it  This test is not yet approved or cleared by the [place] FDA and has been authorized for detection and/or diagnosis of SARS-CoV-2 by FDA under an Emergency Use Authorization (EUA). This EUA will remain in effect (meaning this test can be used) for the duration of the COVID-19 declaration under Section 564(b)(1) of the Act, 21 U.S.C. section 360bbb-3(b)(1), unless the authorization is terminated or revoked.     Resp Syncytial Virus by PCR NEGATIVE NEGATIVE Final    Comment: (NOTE) Fact Sheet for Patients: BloggerCourse.com  Fact Sheet for Healthcare Providers: SeriousBroker.it  This test is not yet approved or cleared by the [place] FDA and has been authorized for detection and/or diagnosis of SARS-CoV-2 by FDA under an Emergency Use Authorization (EUA). This EUA will remain in effect (meaning this test can be used) for the duration of the COVID-19 declaration under Section 564(b)(1) of the Act, 21 U.S.C. section 360bbb-3(b)(1), unless the authorization is terminated or revoked.  Performed at [facility], [address] [place], [place] [address]      Radiology Studies: ECHOCARDIOGRAM COMPLETE  Result Date: 09/02/2023    ECHOCARDIOGRAM REPORT   Patient Name:   George Boyer Date of Exam: 09/02/2023 Medical Rec #:  [id number]    Height:       68.0 in Accession #:    [id number]   Weight:       232.8 lb Date of Birth:  1951-08-23    BSA:          2.180 m Patient Age:    72 years     BP:           109/39 mmHg Patient Gender: M            HR:           47 bpm. Exam Location:  Inpatient Procedure: 2D Echo, Color Doppler, Cardiac Doppler and Intracardiac            Opacification Agent Indications:    CHF  History:        Patient has no prior history of Echocardiogram  examinations.                 CAD, CKD and Carotid Disease, Aortic Valve Disease,                 Arrythmias:Atrial Fibrillation; Risk Factors:Diabetes, Sleep                 Apnea, Dyslipidemia and Hypertension.  Sonographer:    [facility] Referring Phys: [id number] Cecille Po MELVIN IMPRESSIONS  1. Left ventricular ejection fraction, by estimation, is 60 to 65%. The left ventricle  the [employer] and  has been authorized for detection and/or diagnosis of SARS-CoV-2 by FDA under an Emergency Use Authorization (EUA). This EUA will remain  in effect (meaning this test can be used) for the duration of the COVID-19 declaration under Section 564(b)(1) of the Act, 21 U.S.C.section 360bbb-3(b)(1), unless the authorization is terminated  or revoked sooner.       Influenza A by PCR NEGATIVE NEGATIVE Final   Influenza B by PCR NEGATIVE NEGATIVE Final    Comment: (NOTE) The Xpert Xpress SARS-CoV-2/FLU/RSV plus assay is intended as an aid in the diagnosis of influenza from Nasopharyngeal swab specimens and should not be used as a sole basis for treatment. Nasal washings and aspirates are unacceptable for Xpert Xpress SARS-CoV-2/FLU/RSV testing.  Fact Sheet for  Patients: BloggerCourse.com  Fact Sheet for Healthcare Providers: SeriousBroker.it  This test is not yet approved or cleared by the [place] FDA and has been authorized for detection and/or diagnosis of SARS-CoV-2 by FDA under an Emergency Use Authorization (EUA). This EUA will remain in effect (meaning this test can be used) for the duration of the COVID-19 declaration under Section 564(b)(1) of the Act, 21 U.S.C. section 360bbb-3(b)(1), unless the authorization is terminated or revoked.     Resp Syncytial Virus by PCR NEGATIVE NEGATIVE Final    Comment: (NOTE) Fact Sheet for Patients: BloggerCourse.com  Fact Sheet for Healthcare Providers: SeriousBroker.it  This test is not yet approved or cleared by the [place] FDA and has been authorized for detection and/or diagnosis of SARS-CoV-2 by FDA under an Emergency Use Authorization (EUA). This EUA will remain in effect (meaning this test can be used) for the duration of the COVID-19 declaration under Section 564(b)(1) of the Act, 21 U.S.C. section 360bbb-3(b)(1), unless the authorization is terminated or revoked.  Performed at [facility], [address] [place], [place] [address]      Radiology Studies: ECHOCARDIOGRAM COMPLETE  Result Date: 09/02/2023    ECHOCARDIOGRAM REPORT   Patient Name:   George Boyer Date of Exam: 09/02/2023 Medical Rec #:  [id number]    Height:       68.0 in Accession #:    [id number]   Weight:       232.8 lb Date of Birth:  1951-08-23    BSA:          2.180 m Patient Age:    72 years     BP:           109/39 mmHg Patient Gender: M            HR:           47 bpm. Exam Location:  Inpatient Procedure: 2D Echo, Color Doppler, Cardiac Doppler and Intracardiac            Opacification Agent Indications:    CHF  History:        Patient has no prior history of Echocardiogram  examinations.                 CAD, CKD and Carotid Disease, Aortic Valve Disease,                 Arrythmias:Atrial Fibrillation; Risk Factors:Diabetes, Sleep                 Apnea, Dyslipidemia and Hypertension.  Sonographer:    [facility] Referring Phys: [id number] Cecille Po MELVIN IMPRESSIONS  1. Left ventricular ejection fraction, by estimation, is 60 to 65%. The left ventricle

## 2023-09-03 NOTE — Progress Notes (Signed)
Mobility Specialist Progress Note:    09/03/23 1221  Mobility  Activity Ambulated with assistance in hallway  Level of Assistance Contact guard assist, steadying assist  Assistive Device Front wheel walker  Distance Ambulated (ft) 300 ft  Activity Response Tolerated well  Mobility Referral Yes  $Mobility charge 1 Mobility  Mobility Specialist Start Time (ACUTE ONLY) 1140  Mobility Specialist Stop Time (ACUTE ONLY) 1155  Mobility Specialist Time Calculation (min) (ACUTE ONLY) 15 min   Received pt supine in bed having no complaints and agreeable to mobility. Pt was asymptomatic throughout ambulation and returned to room w/o fault. Left seated EOB w/ call bell in reach and all needs met.   Thompson Grayer Mobility Specialist  Please contact vis Secure Chat or  Rehab Office [phone]

## 2023-09-04 DIAGNOSIS — I5033 Acute on chronic diastolic (congestive) heart failure: Secondary | ICD-10-CM | POA: Diagnosis not present

## 2023-09-04 LAB — BASIC METABOLIC PANEL
Anion gap: 11 (ref 5–15)
BUN: 32 mg/dL — ABNORMAL HIGH (ref 8–23)
CO2: 26 mmol/L (ref 22–32)
Calcium: 9 mg/dL (ref 8.9–10.3)
Chloride: 103 mmol/L (ref 98–111)
Creatinine, Ser: 2.21 mg/dL — ABNORMAL HIGH (ref 0.61–1.24)
GFR, Estimated: 30 mL/min — ABNORMAL LOW (ref >60)
Glucose, Bld: 97 mg/dL (ref 70–99)
Potassium: 3.8 mmol/L (ref 3.5–5.1)
Sodium: 140 mmol/L (ref 135–145)

## 2023-09-04 LAB — CBC
HCT: 32.2 % — ABNORMAL LOW (ref 39.0–52.0)
Hemoglobin: 10.3 g/dL — ABNORMAL LOW (ref 13.0–17.0)
MCH: 26.3 pg (ref 26.0–34.0)
MCHC: 32 g/dL (ref 30.0–36.0)
MCV: 82.1 fL (ref 80.0–100.0)
Platelets: 280 10*3/uL (ref 150–400)
RBC: 3.92 MIL/uL — ABNORMAL LOW (ref 4.22–5.81)
RDW: 16.3 % — ABNORMAL HIGH (ref 11.5–15.5)
WBC: 6.6 10*3/uL (ref 4.0–10.5)
nRBC: 0 % (ref 0.0–0.2)

## 2023-09-04 LAB — GLUCOSE, CAPILLARY
Glucose-Capillary: 108 mg/dL — ABNORMAL HIGH (ref 70–99)
Glucose-Capillary: 113 mg/dL — ABNORMAL HIGH (ref 70–99)

## 2023-09-04 MED ORDER — POLYSACCHARIDE IRON COMPLEX 150 MG PO CAPS: 150.0000 mg | ORAL_CAPSULE | Freq: Every day | ORAL | 0 refills | Status: DC

## 2023-09-04 MED ORDER — TOUJEO SOLOSTAR 300 UNIT/ML ~~LOC~~ SOPN: 25.0000 [IU] | PEN_INJECTOR | Freq: Two times a day (BID) | SUBCUTANEOUS | Status: DC

## 2023-09-04 NOTE — Plan of Care (Signed)
  Problem: Pain Managment: Goal: General experience of comfort will improve Outcome: Progressing   Problem: Safety: Goal: Ability to remain free from injury will improve Outcome: Progressing   Problem: Skin Integrity: Goal: Risk for impaired skin integrity will decrease Outcome: Progressing   

## 2023-09-04 NOTE — TOC Initial Note (Signed)
Transition of Care [facility]) - Initial/Assessment Note    Patient Details  Name: George Boyer MRN: [id number] Date of Birth: 11-07-1951  Transition of Care [facility]) CM/SW Contact:    Leone Haven, RN Phone Number: 09/04/2023, 10:07 AM  Clinical Narrative:                 From home with spouse, has PCP and insurance on file, states has no HH services in place at this time, has a walker at home.   States family member will transport them home at [employer] and family is support system, states gets medications from [facility] on [address].  Pta self ambulatory with walker.  Expected Discharge Plan: Home/Self Care Barriers to Discharge: No Barriers Identified   Patient Goals and CMS Choice Patient states their goals for this hospitalization and ongoing recovery are:: return home   Choice offered to / list presented to : NA      Expected Discharge Plan and Services In-house Referral: NA Discharge Planning Services: CM Consult Post Acute Care Choice: NA Living arrangements for the past 2 months: Single Family Home Expected Discharge Date: 09/04/23               DME Arranged: N/A DME Agency: NA       HH Arranged: NA          Prior Living Arrangements/Services Living arrangements for the past 2 months: Single Family Home Lives with:: Spouse Patient language and need for interpreter reviewed:: Yes Do you feel safe going back to the place where you live?: Yes      Need for Family Participation in Patient Care: Yes (Comment) Care giver support system in place?: Yes (comment) Current home services: DME (walker) Criminal Activity/Legal Involvement Pertinent to Current Situation/Hospitalization: No - Comment as needed  Activities of Daily Living   ADL Screening (condition at time of admission) Independently performs ADLs?: Yes (appropriate for developmental age) Is the patient deaf or have difficulty hearing?: No Does the patient have difficulty seeing, even when wearing  glasses/contacts?: No Does the patient have difficulty concentrating, remembering, or making decisions?: No  Permission Sought/Granted Permission sought to share information with : Case Manager Permission granted to share information with : Yes, Verbal Permission Granted              Emotional Assessment Appearance:: Appears stated age Attitude/Demeanor/Rapport: Engaged Affect (typically observed): Appropriate Orientation: : Oriented to Self, Oriented to Place, Oriented to  Time, Oriented to Situation Alcohol / Substance Use: Not Applicable Psych Involvement: No (comment)  Admission diagnosis:  Hypoxia [R09.02] Abnormal CT of the abdomen [R93.5] AKI (acute kidney injury) (HCC) [N17.9] Hypervolemia, unspecified hypervolemia type [E87.70] Acute on chronic diastolic (congestive) heart failure (HCC) [I50.33] Patient Active Problem List   Diagnosis Date Noted   Acute on chronic diastolic (congestive) heart failure (HCC) 09/01/2023   Mild cognitive impairment 06/13/2023   Bilateral carotid artery stenosis 01/31/2022   Aortic valve stenosis 01/27/2021   Erythema of hand 05/15/2020   Diastolic dysfunction 04/08/2020   Anemia due to chronic kidney disease 07/11/2019   CKD stage 3 due to type 2 diabetes mellitus (HCC) 07/11/2019   Microalbuminuria 07/11/2019   SOB (shortness of breath) on exertion 04/19/2017   S/P coronary artery stent placement 05/04/2016   Obstructive sleep apnea 09/29/2015   High risk medication use 08/21/2014   Body mass index 40.0-44.9, adult (HCC) 08/14/2013   Anxiety and depression 08/15/2012   Arthralgia 08/15/2012   Bradycardia 08/15/2012   CAD  in native artery 08/15/2012   Central obesity 08/15/2012   Dependent edema 08/15/2012   Dyslipidemia 08/15/2012   Erectile dysfunction 08/15/2012   HTN (hypertension) 08/15/2012   T2DM (type 2 diabetes mellitus) (HCC) 08/15/2012   Vitamin D deficiency 08/15/2012   Hyperlipidemia LDL goal <100 08/15/2012    Unspecified atrial fibrillation (HCC) 07/13/2012   Long term (current) use of anticoagulants 07/13/2012   PCP:  [name], MD Pharmacy:   [id number] [place], [place] - [address] AT [facility] OF [address] [place] [address] Phone: [phone] Fax: [phone]  [id number] [place] Otto, [place] - [address] AT [facility] OF [address] & [facility] [address] [place] [address] Phone: [phone] Fax: [phone]     Social Determinants of Health (SDOH) Social History: SDOH Screenings   Food Insecurity: No Food Insecurity (09/01/2023)  Housing: Low Risk  (09/01/2023)  Transportation Needs: No Transportation Needs (09/01/2023)  Utilities: Not At Risk (09/01/2023)  Financial Resource Strain: Low Risk  (03/12/2023)   Received from [facility], [facility]  Physical Activity: Unknown (03/12/2023)   Received from [facility], [facility]  Social Connections: Moderately Integrated (03/12/2023)   Received from [facility], [facility]  Stress: No Stress Concern Present (03/12/2023)   Received from [facility], [facility]  Recent Concern: Stress - Stress Concern Present (12/19/2022)   Received from [facility]  Tobacco Use: Low Risk  (09/01/2023)   SDOH Interventions:     Readmission Risk Interventions     No data to display

## 2023-09-04 NOTE — TOC Transition Note (Signed)
Transition of Care [facility]) - CM/SW Discharge Note   Patient Details  Name: George Boyer MRN: [id number] Date of Birth: [birth year]  [facility] of Care [facility]) CM/SW Contact:  Leone Haven, RN Phone Number: 09/04/2023, 10:10 AM   Clinical Narrative:    Patient is for dc today, wife will transport him home. No needs.   Final next level of care: Home/Self Care Barriers to Discharge: No Barriers Identified   Patient Goals and CMS Choice   Choice offered to / list presented to : NA  Discharge Placement                         Discharge Plan and Services Additional resources added to the After Visit Summary for   In-house Referral: NA Discharge Planning Services: CM Consult Post Acute Care Choice: NA          DME Arranged: N/A DME Agency: NA       HH Arranged: NA          Social Determinants of Health (SDOH) Interventions SDOH Screenings   Food Insecurity: No Food Insecurity (09/01/2023)  Housing: Low Risk  (09/01/2023)  Transportation Needs: No Transportation Needs (09/01/2023)  Utilities: Not At Risk (09/01/2023)  Financial Resource Strain: Low Risk  (03/12/2023)   Received from [facility], [facility]  Physical Activity: Unknown (03/12/2023)   Received from [facility], [facility]  Social Connections: Moderately Integrated (03/12/2023)   Received from [facility], [facility]  Stress: No Stress Concern Present (03/12/2023)   [facility] from [facility], [facility]  Recent Concern: Stress - Stress Concern Present (12/19/2022)   Received from [facility]  Tobacco Use: Low Risk  (09/01/2023)     Readmission Risk Interventions     No data to display

## 2023-09-04 NOTE — Progress Notes (Signed)
Heart Failure Navigator Progress Note  Assessed for Heart & Vascular TOC clinic readiness.  Patient does not meet criteria per Dr. Jomarie Longs. Will sign off.   Sharen Hones, PharmD, BCPS Heart Failure Stewardship Pharmacist Phone [phone]

## 2023-09-04 NOTE — Progress Notes (Signed)
AVS given and reviewed with pt and wife at bedside. Medications discussed. All questions answered to satisfaction. Wife verbalized understanding of information given. Pt escorted off the unit with all belongings via wheelchair by staff member.

## 2023-09-04 NOTE — Discharge Summary (Addendum)
Physician Discharge Summary  George Boyer ZOX:[id number] DOB: 1951-10-30 DOA: 09/01/2023  PCP: [name], MD  Admit date: 09/01/2023 Discharge date: 09/04/2023  Time spent: 45 minutes  Recommendations for Outpatient Follow-up:  PCP in 1 week, please check BMP, CBC at follow-up Needs close GI follow-up/workup, urgent referral sent for severe iron deficiency anemia [facility], Dr. Sampson Goon in 1 month  Discharge Diagnoses:    Severe Iron Deficiency anemia   Acute on chronic diastolic (congestive) heart failure (HCC)   Cholelithiases   Anxiety and depression   Body mass index 40.0-44.9, adult (HCC)   Bradycardia   CAD in native artery   Dyslipidemia   HTN (hypertension)   Obstructive sleep apnea   S/P coronary artery stent placement   T2DM (type 2 diabetes mellitus) (HCC)   Unspecified atrial fibrillation (HCC)   Anemia due to chronic kidney disease   CKD stage 3 due to type 2 diabetes mellitus (HCC)   Hyperlipidemia LDL goal <100   Diastolic dysfunction   Bilateral carotid artery stenosis   Discharge Condition: Improved  Diet recommendation: Low-sodium, diabetic  Filed Weights   09/01/23 1130 09/02/23 0440 09/03/23 0504  Weight: 109.8 kg 105.6 kg 102.6 kg    History of present illness:  72/M with history of diastolic CHF, hypertension, dyslipidemia, CAD, type 2 diabetes mellitus, paroxysmal A-fib, CKD 3, OSA, obesity, anxiety, depression, mild cognitive impairment presented to the ED with worsening shortness of breath, swelling and fatigue. -In the ED mildly bradycardic, tachypneic, labs noted potassium of 5.5, BUN of 42, creatinine 2.2, hemoglobin 8.9, BNP 500, troponin 30, TSH normal, chest x-ray with cardiomegaly small pleural effusion and pulmonary edema, right upper quadrant ultrasound with gallstones only, CT abdomen pelvis noted gallstones and mild pericholecystic fluid, pulmonary edema and large stool burden  Hospital Course:   Acute on chronic  diastolic CHF Aortic stenosis Hypertension -Last echo 5/24 with EF 60%, mild LVH moderate aortic stenosis -Repeat echo with EF 60-65%, normal RV, mild to moderate aortic stenosis -Admitted with volume overload suspect primarily triggered by worsening anemia -Improved with diuretics, 9 L negative, weight down 16 LB, creatinine slightly improved at 2.2 at discharge -GDMT limited by CKD 4 -Follow-up with cardiology Dr. Sampson Goon in [place]  Severe iron deficiency anemia -Hemoglobin down to 7.7 on 10/12, anemia panel with severe iron deficiency -Hemoglobin was normal earlier this year, patient denies melena hematochezia, hematuria -Transfused 1 unit PRBC and given IV iron, hemoglobin improved to the 10 range and stable, added oral iron at discharge- -Will need GI workup, remote colonoscopy allegedly unremarkable, will send urgent GI referral at discharge   Paroxysmal atrial fibrillation Sinus bradycardia -Heart rate in the 45-55 range, remains asymptomatic - Continue amiodarone, ranolazine, Eliquis   AKI on CKD 3 -Baseline creatinine around 1.9,  -peaked at 2.5, slowly improving, cardiorenal, monitor    CAD Carotid artery disease > History of coronary stenting - Continue Ranexa, statin, Eliquis   Diabetes > 35U BID at home.   Constipation Increase stool burden noted on CT -Continue Senokot and MiraLAX   OSA - Continue home CPAP   Obesity - Noted   MCI - Continue home donepezil   Anxiety Depression - Continue home bupropion  Discharge Exam: Vitals:   09/04/23 0744 09/04/23 1107  BP: (!) 129/41 (!) 136/35  Pulse: (!) 56 (!) 52  Resp:  20  Temp:  97.8 F (36.6 C)  SpO2: 100% 94%   General exam: Chronically ill elderly male laying in bed, AAOx3  Physician Discharge Summary  George Boyer ZOX:[id number] DOB: 1951-10-30 DOA: 09/01/2023  PCP: [name], MD  Admit date: 09/01/2023 Discharge date: 09/04/2023  Time spent: 45 minutes  Recommendations for Outpatient Follow-up:  PCP in 1 week, please check BMP, CBC at follow-up Needs close GI follow-up/workup, urgent referral sent for severe iron deficiency anemia [facility], Dr. Sampson Goon in 1 month  Discharge Diagnoses:    Severe Iron Deficiency anemia   Acute on chronic diastolic (congestive) heart failure (HCC)   Cholelithiases   Anxiety and depression   Body mass index 40.0-44.9, adult (HCC)   Bradycardia   CAD in native artery   Dyslipidemia   HTN (hypertension)   Obstructive sleep apnea   S/P coronary artery stent placement   T2DM (type 2 diabetes mellitus) (HCC)   Unspecified atrial fibrillation (HCC)   Anemia due to chronic kidney disease   CKD stage 3 due to type 2 diabetes mellitus (HCC)   Hyperlipidemia LDL goal <100   Diastolic dysfunction   Bilateral carotid artery stenosis   Discharge Condition: Improved  Diet recommendation: Low-sodium, diabetic  Filed Weights   09/01/23 1130 09/02/23 0440 09/03/23 0504  Weight: 109.8 kg 105.6 kg 102.6 kg    History of present illness:  72/M with history of diastolic CHF, hypertension, dyslipidemia, CAD, type 2 diabetes mellitus, paroxysmal A-fib, CKD 3, OSA, obesity, anxiety, depression, mild cognitive impairment presented to the ED with worsening shortness of breath, swelling and fatigue. -In the ED mildly bradycardic, tachypneic, labs noted potassium of 5.5, BUN of 42, creatinine 2.2, hemoglobin 8.9, BNP 500, troponin 30, TSH normal, chest x-ray with cardiomegaly small pleural effusion and pulmonary edema, right upper quadrant ultrasound with gallstones only, CT abdomen pelvis noted gallstones and mild pericholecystic fluid, pulmonary edema and large stool burden  Hospital Course:   Acute on chronic  diastolic CHF Aortic stenosis Hypertension -Last echo 5/24 with EF 60%, mild LVH moderate aortic stenosis -Repeat echo with EF 60-65%, normal RV, mild to moderate aortic stenosis -Admitted with volume overload suspect primarily triggered by worsening anemia -Improved with diuretics, 9 L negative, weight down 16 LB, creatinine slightly improved at 2.2 at discharge -GDMT limited by CKD 4 -Follow-up with cardiology Dr. Sampson Goon in [place]  Severe iron deficiency anemia -Hemoglobin down to 7.7 on 10/12, anemia panel with severe iron deficiency -Hemoglobin was normal earlier this year, patient denies melena hematochezia, hematuria -Transfused 1 unit PRBC and given IV iron, hemoglobin improved to the 10 range and stable, added oral iron at discharge- -Will need GI workup, remote colonoscopy allegedly unremarkable, will send urgent GI referral at discharge   Paroxysmal atrial fibrillation Sinus bradycardia -Heart rate in the 45-55 range, remains asymptomatic - Continue amiodarone, ranolazine, Eliquis   AKI on CKD 3 -Baseline creatinine around 1.9,  -peaked at 2.5, slowly improving, cardiorenal, monitor    CAD Carotid artery disease > History of coronary stenting - Continue Ranexa, statin, Eliquis   Diabetes > 35U BID at home.   Constipation Increase stool burden noted on CT -Continue Senokot and MiraLAX   OSA - Continue home CPAP   Obesity - Noted   MCI - Continue home donepezil   Anxiety Depression - Continue home bupropion  Discharge Exam: Vitals:   09/04/23 0744 09/04/23 1107  BP: (!) 129/41 (!) 136/35  Pulse: (!) 56 (!) 52  Resp:  20  Temp:  97.8 F (36.6 C)  SpO2: 100% 94%   General exam: Chronically ill elderly male laying in bed, AAOx3  Physician Discharge Summary  George Boyer ZOX:[id number] DOB: 1951-10-30 DOA: 09/01/2023  PCP: [name], MD  Admit date: 09/01/2023 Discharge date: 09/04/2023  Time spent: 45 minutes  Recommendations for Outpatient Follow-up:  PCP in 1 week, please check BMP, CBC at follow-up Needs close GI follow-up/workup, urgent referral sent for severe iron deficiency anemia [facility], Dr. Sampson Goon in 1 month  Discharge Diagnoses:    Severe Iron Deficiency anemia   Acute on chronic diastolic (congestive) heart failure (HCC)   Cholelithiases   Anxiety and depression   Body mass index 40.0-44.9, adult (HCC)   Bradycardia   CAD in native artery   Dyslipidemia   HTN (hypertension)   Obstructive sleep apnea   S/P coronary artery stent placement   T2DM (type 2 diabetes mellitus) (HCC)   Unspecified atrial fibrillation (HCC)   Anemia due to chronic kidney disease   CKD stage 3 due to type 2 diabetes mellitus (HCC)   Hyperlipidemia LDL goal <100   Diastolic dysfunction   Bilateral carotid artery stenosis   Discharge Condition: Improved  Diet recommendation: Low-sodium, diabetic  Filed Weights   09/01/23 1130 09/02/23 0440 09/03/23 0504  Weight: 109.8 kg 105.6 kg 102.6 kg    History of present illness:  72/M with history of diastolic CHF, hypertension, dyslipidemia, CAD, type 2 diabetes mellitus, paroxysmal A-fib, CKD 3, OSA, obesity, anxiety, depression, mild cognitive impairment presented to the ED with worsening shortness of breath, swelling and fatigue. -In the ED mildly bradycardic, tachypneic, labs noted potassium of 5.5, BUN of 42, creatinine 2.2, hemoglobin 8.9, BNP 500, troponin 30, TSH normal, chest x-ray with cardiomegaly small pleural effusion and pulmonary edema, right upper quadrant ultrasound with gallstones only, CT abdomen pelvis noted gallstones and mild pericholecystic fluid, pulmonary edema and large stool burden  Hospital Course:   Acute on chronic  diastolic CHF Aortic stenosis Hypertension -Last echo 5/24 with EF 60%, mild LVH moderate aortic stenosis -Repeat echo with EF 60-65%, normal RV, mild to moderate aortic stenosis -Admitted with volume overload suspect primarily triggered by worsening anemia -Improved with diuretics, 9 L negative, weight down 16 LB, creatinine slightly improved at 2.2 at discharge -GDMT limited by CKD 4 -Follow-up with cardiology Dr. Sampson Goon in [place]  Severe iron deficiency anemia -Hemoglobin down to 7.7 on 10/12, anemia panel with severe iron deficiency -Hemoglobin was normal earlier this year, patient denies melena hematochezia, hematuria -Transfused 1 unit PRBC and given IV iron, hemoglobin improved to the 10 range and stable, added oral iron at discharge- -Will need GI workup, remote colonoscopy allegedly unremarkable, will send urgent GI referral at discharge   Paroxysmal atrial fibrillation Sinus bradycardia -Heart rate in the 45-55 range, remains asymptomatic - Continue amiodarone, ranolazine, Eliquis   AKI on CKD 3 -Baseline creatinine around 1.9,  -peaked at 2.5, slowly improving, cardiorenal, monitor    CAD Carotid artery disease > History of coronary stenting - Continue Ranexa, statin, Eliquis   Diabetes > 35U BID at home.   Constipation Increase stool burden noted on CT -Continue Senokot and MiraLAX   OSA - Continue home CPAP   Obesity - Noted   MCI - Continue home donepezil   Anxiety Depression - Continue home bupropion  Discharge Exam: Vitals:   09/04/23 0744 09/04/23 1107  BP: (!) 129/41 (!) 136/35  Pulse: (!) 56 (!) 52  Resp:  20  Temp:  97.8 F (36.6 C)  SpO2: 100% 94%   General exam: Chronically ill elderly male laying in bed, AAOx3  Physician Discharge Summary  George Boyer ZOX:[id number] DOB: 1951-10-30 DOA: 09/01/2023  PCP: [name], MD  Admit date: 09/01/2023 Discharge date: 09/04/2023  Time spent: 45 minutes  Recommendations for Outpatient Follow-up:  PCP in 1 week, please check BMP, CBC at follow-up Needs close GI follow-up/workup, urgent referral sent for severe iron deficiency anemia [facility], Dr. Sampson Goon in 1 month  Discharge Diagnoses:    Severe Iron Deficiency anemia   Acute on chronic diastolic (congestive) heart failure (HCC)   Cholelithiases   Anxiety and depression   Body mass index 40.0-44.9, adult (HCC)   Bradycardia   CAD in native artery   Dyslipidemia   HTN (hypertension)   Obstructive sleep apnea   S/P coronary artery stent placement   T2DM (type 2 diabetes mellitus) (HCC)   Unspecified atrial fibrillation (HCC)   Anemia due to chronic kidney disease   CKD stage 3 due to type 2 diabetes mellitus (HCC)   Hyperlipidemia LDL goal <100   Diastolic dysfunction   Bilateral carotid artery stenosis   Discharge Condition: Improved  Diet recommendation: Low-sodium, diabetic  Filed Weights   09/01/23 1130 09/02/23 0440 09/03/23 0504  Weight: 109.8 kg 105.6 kg 102.6 kg    History of present illness:  72/M with history of diastolic CHF, hypertension, dyslipidemia, CAD, type 2 diabetes mellitus, paroxysmal A-fib, CKD 3, OSA, obesity, anxiety, depression, mild cognitive impairment presented to the ED with worsening shortness of breath, swelling and fatigue. -In the ED mildly bradycardic, tachypneic, labs noted potassium of 5.5, BUN of 42, creatinine 2.2, hemoglobin 8.9, BNP 500, troponin 30, TSH normal, chest x-ray with cardiomegaly small pleural effusion and pulmonary edema, right upper quadrant ultrasound with gallstones only, CT abdomen pelvis noted gallstones and mild pericholecystic fluid, pulmonary edema and large stool burden  Hospital Course:   Acute on chronic  diastolic CHF Aortic stenosis Hypertension -Last echo 5/24 with EF 60%, mild LVH moderate aortic stenosis -Repeat echo with EF 60-65%, normal RV, mild to moderate aortic stenosis -Admitted with volume overload suspect primarily triggered by worsening anemia -Improved with diuretics, 9 L negative, weight down 16 LB, creatinine slightly improved at 2.2 at discharge -GDMT limited by CKD 4 -Follow-up with cardiology Dr. Sampson Goon in [place]  Severe iron deficiency anemia -Hemoglobin down to 7.7 on 10/12, anemia panel with severe iron deficiency -Hemoglobin was normal earlier this year, patient denies melena hematochezia, hematuria -Transfused 1 unit PRBC and given IV iron, hemoglobin improved to the 10 range and stable, added oral iron at discharge- -Will need GI workup, remote colonoscopy allegedly unremarkable, will send urgent GI referral at discharge   Paroxysmal atrial fibrillation Sinus bradycardia -Heart rate in the 45-55 range, remains asymptomatic - Continue amiodarone, ranolazine, Eliquis   AKI on CKD 3 -Baseline creatinine around 1.9,  -peaked at 2.5, slowly improving, cardiorenal, monitor    CAD Carotid artery disease > History of coronary stenting - Continue Ranexa, statin, Eliquis   Diabetes > 35U BID at home.   Constipation Increase stool burden noted on CT -Continue Senokot and MiraLAX   OSA - Continue home CPAP   Obesity - Noted   MCI - Continue home donepezil   Anxiety Depression - Continue home bupropion  Discharge Exam: Vitals:   09/04/23 0744 09/04/23 1107  BP: (!) 129/41 (!) 136/35  Pulse: (!) 56 (!) 52  Resp:  20  Temp:  97.8 F (36.6 C)  SpO2: 100% 94%   General exam: Chronically ill elderly male laying in bed, AAOx3  Follow-up Information     [name], MD. Go on 09/11/2023.   Specialty: Internal Medicine Why: @3pm  Contact information: [address] [place] [address] Laurell Josephs [address] [facility] [place] [address] [phone]                  The results of significant diagnostics from this hospitalization (including imaging, microbiology, ancillary and laboratory) are listed below for reference.    Significant Diagnostic Studies: ECHOCARDIOGRAM COMPLETE  Result Date: 09/02/2023    ECHOCARDIOGRAM REPORT   Patient Name:   George Boyer Date of Exam: 09/02/2023 Medical Rec #:  [id number]    Height:       68.0 in Accession #:    [id number]   Weight:       232.8 lb Date of Birth:  [birth year]    BSA:          2.180 m Patient Age:    72 years     BP:           109/39 mmHg Patient Gender: M            HR:           47 bpm. Exam Location:  Inpatient Procedure: 2D Echo, Color Doppler, Cardiac Doppler  and Intracardiac            Opacification Agent Indications:    CHF  History:        Patient has no prior history of Echocardiogram examinations.                 CAD, CKD and Carotid Disease, Aortic Valve Disease,                 Arrythmias:Atrial Fibrillation; Risk Factors:Diabetes, Sleep                 Apnea, Dyslipidemia and Hypertension.  Sonographer:    [facility] Referring Phys: [id number] Cecille Po MELVIN IMPRESSIONS  1. Left ventricular ejection fraction, by estimation, is 60 to 65%. The left ventricle has normal function. The left ventricle has no regional wall motion abnormalities. There is mild left ventricular hypertrophy. Left ventricular diastolic parameters are indeterminate.  2. Right ventricular systolic function is normal. The right ventricular size is normal. Tricuspid regurgitation signal is inadequate for assessing PA pressure.  3. Left atrial size was mildly dilated.  4. Right atrial size was mildly dilated.  5. The mitral valve is degenerative. Trivial mitral valve regurgitation.  6. There is moderate calcification of the aortic valve. Aortic valve regurgitation is not visualized. Mild to moderate aortic valve stenosis.  7. The inferior vena cava is normal in size with greater than 50% respiratory variability, suggesting right atrial pressure of 3 mmHg. Comparison(s): No prior Echocardiogram. FINDINGS  Left Ventricle: Left ventricular ejection fraction, by estimation, is 60 to 65%. The left ventricle has normal function. The left ventricle has no regional wall motion abnormalities. Definity contrast agent was given IV to delineate the left ventricular  endocardial borders. The left ventricular internal cavity size was normal in size. There is mild left ventricular hypertrophy. Left ventricular diastolic parameters are indeterminate. Right Ventricle: The right ventricular size is normal. Right ventricular systolic function is normal. Tricuspid regurgitation signal is inadequate for assessing PA  pressure. Left [facility]: Left atrial size was mildly dilated. Right [facility]: Right atrial size was mildly dilated. Pericardium: There is no evidence of pericardial effusion. Mitral Valve: The mitral valve is degenerative in appearance. Trivial mitral valve regurgitation. MV peak gradient, 9.5 mmHg. The  Follow-up Information     [name], MD. Go on 09/11/2023.   Specialty: Internal Medicine Why: @3pm  Contact information: [address] [place] [address] Laurell Josephs [address] [facility] [place] [address] [phone]                  The results of significant diagnostics from this hospitalization (including imaging, microbiology, ancillary and laboratory) are listed below for reference.    Significant Diagnostic Studies: ECHOCARDIOGRAM COMPLETE  Result Date: 09/02/2023    ECHOCARDIOGRAM REPORT   Patient Name:   George Boyer Date of Exam: 09/02/2023 Medical Rec #:  [id number]    Height:       68.0 in Accession #:    [id number]   Weight:       232.8 lb Date of Birth:  [birth year]    BSA:          2.180 m Patient Age:    72 years     BP:           109/39 mmHg Patient Gender: M            HR:           47 bpm. Exam Location:  Inpatient Procedure: 2D Echo, Color Doppler, Cardiac Doppler  and Intracardiac            Opacification Agent Indications:    CHF  History:        Patient has no prior history of Echocardiogram examinations.                 CAD, CKD and Carotid Disease, Aortic Valve Disease,                 Arrythmias:Atrial Fibrillation; Risk Factors:Diabetes, Sleep                 Apnea, Dyslipidemia and Hypertension.  Sonographer:    [facility] Referring Phys: [id number] Cecille Po MELVIN IMPRESSIONS  1. Left ventricular ejection fraction, by estimation, is 60 to 65%. The left ventricle has normal function. The left ventricle has no regional wall motion abnormalities. There is mild left ventricular hypertrophy. Left ventricular diastolic parameters are indeterminate.  2. Right ventricular systolic function is normal. The right ventricular size is normal. Tricuspid regurgitation signal is inadequate for assessing PA pressure.  3. Left atrial size was mildly dilated.  4. Right atrial size was mildly dilated.  5. The mitral valve is degenerative. Trivial mitral valve regurgitation.  6. There is moderate calcification of the aortic valve. Aortic valve regurgitation is not visualized. Mild to moderate aortic valve stenosis.  7. The inferior vena cava is normal in size with greater than 50% respiratory variability, suggesting right atrial pressure of 3 mmHg. Comparison(s): No prior Echocardiogram. FINDINGS  Left Ventricle: Left ventricular ejection fraction, by estimation, is 60 to 65%. The left ventricle has normal function. The left ventricle has no regional wall motion abnormalities. Definity contrast agent was given IV to delineate the left ventricular  endocardial borders. The left ventricular internal cavity size was normal in size. There is mild left ventricular hypertrophy. Left ventricular diastolic parameters are indeterminate. Right Ventricle: The right ventricular size is normal. Right ventricular systolic function is normal. Tricuspid regurgitation signal is inadequate for assessing PA  pressure. Left [facility]: Left atrial size was mildly dilated. Right [facility]: Right atrial size was mildly dilated. Pericardium: There is no evidence of pericardial effusion. Mitral Valve: The mitral valve is degenerative in appearance. Trivial mitral valve regurgitation. MV peak gradient, 9.5 mmHg. The  Physician Discharge Summary  George Boyer ZOX:[id number] DOB: 1951-10-30 DOA: 09/01/2023  PCP: [name], MD  Admit date: 09/01/2023 Discharge date: 09/04/2023  Time spent: 45 minutes  Recommendations for Outpatient Follow-up:  PCP in 1 week, please check BMP, CBC at follow-up Needs close GI follow-up/workup, urgent referral sent for severe iron deficiency anemia [facility], Dr. Sampson Goon in 1 month  Discharge Diagnoses:    Severe Iron Deficiency anemia   Acute on chronic diastolic (congestive) heart failure (HCC)   Cholelithiases   Anxiety and depression   Body mass index 40.0-44.9, adult (HCC)   Bradycardia   CAD in native artery   Dyslipidemia   HTN (hypertension)   Obstructive sleep apnea   S/P coronary artery stent placement   T2DM (type 2 diabetes mellitus) (HCC)   Unspecified atrial fibrillation (HCC)   Anemia due to chronic kidney disease   CKD stage 3 due to type 2 diabetes mellitus (HCC)   Hyperlipidemia LDL goal <100   Diastolic dysfunction   Bilateral carotid artery stenosis   Discharge Condition: Improved  Diet recommendation: Low-sodium, diabetic  Filed Weights   09/01/23 1130 09/02/23 0440 09/03/23 0504  Weight: 109.8 kg 105.6 kg 102.6 kg    History of present illness:  72/M with history of diastolic CHF, hypertension, dyslipidemia, CAD, type 2 diabetes mellitus, paroxysmal A-fib, CKD 3, OSA, obesity, anxiety, depression, mild cognitive impairment presented to the ED with worsening shortness of breath, swelling and fatigue. -In the ED mildly bradycardic, tachypneic, labs noted potassium of 5.5, BUN of 42, creatinine 2.2, hemoglobin 8.9, BNP 500, troponin 30, TSH normal, chest x-ray with cardiomegaly small pleural effusion and pulmonary edema, right upper quadrant ultrasound with gallstones only, CT abdomen pelvis noted gallstones and mild pericholecystic fluid, pulmonary edema and large stool burden  Hospital Course:   Acute on chronic  diastolic CHF Aortic stenosis Hypertension -Last echo 5/24 with EF 60%, mild LVH moderate aortic stenosis -Repeat echo with EF 60-65%, normal RV, mild to moderate aortic stenosis -Admitted with volume overload suspect primarily triggered by worsening anemia -Improved with diuretics, 9 L negative, weight down 16 LB, creatinine slightly improved at 2.2 at discharge -GDMT limited by CKD 4 -Follow-up with cardiology Dr. Sampson Goon in [place]  Severe iron deficiency anemia -Hemoglobin down to 7.7 on 10/12, anemia panel with severe iron deficiency -Hemoglobin was normal earlier this year, patient denies melena hematochezia, hematuria -Transfused 1 unit PRBC and given IV iron, hemoglobin improved to the 10 range and stable, added oral iron at discharge- -Will need GI workup, remote colonoscopy allegedly unremarkable, will send urgent GI referral at discharge   Paroxysmal atrial fibrillation Sinus bradycardia -Heart rate in the 45-55 range, remains asymptomatic - Continue amiodarone, ranolazine, Eliquis   AKI on CKD 3 -Baseline creatinine around 1.9,  -peaked at 2.5, slowly improving, cardiorenal, monitor    CAD Carotid artery disease > History of coronary stenting - Continue Ranexa, statin, Eliquis   Diabetes > 35U BID at home.   Constipation Increase stool burden noted on CT -Continue Senokot and MiraLAX   OSA - Continue home CPAP   Obesity - Noted   MCI - Continue home donepezil   Anxiety Depression - Continue home bupropion  Discharge Exam: Vitals:   09/04/23 0744 09/04/23 1107  BP: (!) 129/41 (!) 136/35  Pulse: (!) 56 (!) 52  Resp:  20  Temp:  97.8 F (36.6 C)  SpO2: 100% 94%   General exam: Chronically ill elderly male laying in bed, AAOx3

## 2023-09-04 NOTE — Progress Notes (Signed)
Mobility Specialist Progress Note:    09/04/23 1028  Mobility  Activity Ambulated with assistance in hallway  Level of Assistance Contact guard assist, steadying assist  Assistive Device Front wheel walker  Distance Ambulated (ft) 160 ft  Range of Motion/Exercises Active;All extremities  Activity Response Tolerated well  Mobility Referral Yes  $Mobility charge 1 Mobility  Mobility Specialist Start Time (ACUTE ONLY) 1000  Mobility Specialist Stop Time (ACUTE ONLY) 1015  Mobility Specialist Time Calculation (min) (ACUTE ONLY) 15 min   Pt received in bed, agreeable to mobility session. Ambulated in hallway with RW, tolerated well. C/o weakness halfway through. Returned pt back to room, all needs met, call bell in reach. Pt sitting comfortably in chair.   Feliciana Rossetti Mobility Specialist Please contact via Special educational needs teacher or  Rehab office at [phone]

## 2023-09-06 ENCOUNTER — Encounter: Payer: Self-pay | Admitting: Gastroenterology

## 2023-09-06 ENCOUNTER — Ambulatory Visit: Payer: Medicare Other | Admitting: Gastroenterology

## 2023-09-06 ENCOUNTER — Telehealth: Payer: Self-pay | Admitting: Gastroenterology

## 2023-09-06 ENCOUNTER — Telehealth: Payer: Self-pay

## 2023-09-06 VITALS — BP 92/52 | HR 48 | Ht 67.0 in | Wt 218.4 lb

## 2023-09-06 DIAGNOSIS — Z860100 Personal history of colon polyps, unspecified: Secondary | ICD-10-CM

## 2023-09-06 DIAGNOSIS — D509 Iron deficiency anemia, unspecified: Secondary | ICD-10-CM | POA: Diagnosis not present

## 2023-09-06 DIAGNOSIS — I5033 Acute on chronic diastolic (congestive) heart failure: Secondary | ICD-10-CM | POA: Diagnosis not present

## 2023-09-06 DIAGNOSIS — K5900 Constipation, unspecified: Secondary | ICD-10-CM | POA: Diagnosis not present

## 2023-09-06 DIAGNOSIS — Z7901 Long term (current) use of anticoagulants: Secondary | ICD-10-CM | POA: Diagnosis not present

## 2023-09-06 MED ORDER — OMEPRAZOLE 20 MG PO CPDR: 20.0000 mg | DELAYED_RELEASE_CAPSULE | Freq: Every day | ORAL | 1 refills | Status: DC

## 2023-09-06 MED ORDER — NA SULFATE-K SULFATE-MG SULF 17.5-3.13-1.6 GM/177ML PO SOLN: 1.0000 | Freq: Once | ORAL | 0 refills | Status: AC

## 2023-09-06 NOTE — Telephone Encounter (Signed)
New instructions printed and mailed to patient.  And sent to MyChart

## 2023-09-06 NOTE — Telephone Encounter (Signed)
Inbound call from patient's wife, has a scheduling conflict for procedure on 11/14. Patient was rescheduled for 11/11 at 2:00 PM. Would like updated instructions.

## 2023-09-06 NOTE — Progress Notes (Signed)
HPI :  71 year old male with a history of A-fib on Eliquis, CHF, hypertension, hyperlipidemia, stage III-IV CKD, OSA, iron deficiency, referred here by Synetta Fail, MD for iron deficiency anemia.  History obtained by speaking with the patient and his wife as well as review of his chart.  It appears that his anemia dates back to at least April of this past year from review of labs in epic and care everywhere.  Hemoglobin previously was 13 at baseline, dropped to 11.2 with MCV of 90 this past April.  Unfortunately more recently this dropped to a level of 7.7 and he was admitted to the hospital last week for further evaluation.  He was found to be tachypneic in the ED with a BNP of 500 and mild troponin increase with pulmonary edema and heart failure thought to be related to worsening iron deficiency.  His MCV went from 90s to low 80s with iron deficiency noted on labs.  He was given a blood transfusion and his hemoglobin rose to 10.3.  He was also given dose of IV iron.  Patient and his wife state they know he has been anemic for several months.  Initially thought this was due to his chronic kidney disease.  He only recently started iron supplementation.  He is compliant with his Eliquis.  He denies any blood in his stools at all.  He states he passes normal brown stool.  He has had some worsening constipation lately.  He has been taking MiraLAX daily as well as senna every day.  He denies any family history of colon cancer.  His mother did have pancreatic cancer.  He states his last colonoscopy was at age 39 he had some precancerous polyps removed, he knows he is overdue for colonoscopy.  He denies any problems with reflux.  No dysphagia at present.  He does report remote EGD with dilation of his esophagus in the past for dysphagia which resolved his symptoms.  That has not recurred since his EGD which was several years ago.  He denies any nausea or vomiting.  He is eating well.  He occasionally does  have some postprandial epigastric pain but this is not common.  He denies any NSAID use, only uses Tylenol as needed.  In the hospital he had an echocardiogram and was found to have moderate aortic stenosis with a preserved ejection fraction.  He states he was not taking his Lasix at home.  He was diuresed and states he lost over 20 pounds in the hospital.  His breathing is significantly improved and he is feeling better.  He denies any chest pain or shortness of breath.  He states he had his hypertension regimen changes since stopped the irbesartan.  He is taking Lasix daily.  He is following with his primary care and nephrology for his CKD.  Of note, he did have a negative cologuard last year.  While in the hospital he had a CT scan of his abdomen pelvis which did not show any clear pathology in his abdomen to be causing low iron levels.  He did have some gallstones noted.  Follow-up right upper quadrant ultrasound showed no evidence of cholecystitis.   Echo 09/02/23: Left ventricular ejection fraction, by estimation, is 60 to 65%. The  left ventricle has normal function. The left ventricle has no regional  wall motion abnormalities. There is mild left ventricular hypertrophy.  Left ventricular diastolic parameters  are indeterminate.   2. Right ventricular systolic function is normal. The right  ventricular  size is normal. Tricuspid regurgitation signal is inadequate for assessing  PA pressure.   3. Left atrial size was mildly dilated.   4. Right atrial size was mildly dilated.   5. The mitral valve is degenerative. Trivial mitral valve regurgitation.   6. There is moderate calcification of the aortic valve. Aortic valve  regurgitation is not visualized. Mild to moderate aortic valve stenosis.   7. The inferior vena cava is normal in size with greater than 50%  respiratory variability, suggesting right atrial pressure of 3 mmHg.    Cologuard 10/2022 - negative   RUQ [place]  09/01/23: IMPRESSION: Gallstones. No further sonographic evidence of acute cholecystitis. No ductal dilatation.   Small right pleural effusion   CT abdomen / pelvis 09/01/23: IMPRESSION: 1. Cholelithiasis with potential trace amount of pericholecystic fluid, nonspecific in the setting of diffuse body wall anasarca though could be seen in the setting of acute cholecystitis. Clinical correlation is advised. Further evaluation with right upper quadrant ultrasound could be performed as indicated. 2. Cardiomegaly with suspected mild pulmonary edema with trace bilateral effusions and diffuse body wall anasarca. 3. Moderate to large colonic stool burden without evidence of enteric obstruction. 4. Extensive thin mural calcifications involving the mesenteric vessels of the abdomen suggestive of advanced diabetes. 5. Aortic Atherosclerosis (ICD10-I70.0).    Lab Results  Component Value Date   IRON 13 (L) 09/02/2023   TIBC 371 09/02/2023   FERRITIN 7 (L) 09/02/2023     Past Medical History:  Diagnosis Date   Anxiety    Asthma    Atrial fibrillation (HCC)    Cellulitis of right hand 06/04/2020   CHF (congestive heart failure) (HCC)    Chronic kidney disease (CKD)    Colon polyps    Coronary artery disease    Diabetes mellitus without complication (HCC)    Diastolic heart failure (HCC)    Gallstones    HLD (hyperlipidemia)    Hypertension    IDA (iron deficiency anemia)    Obesity    Pneumonia    Sleep apnea treated with continuous positive airway pressure (CPAP)      Past Surgical History:  Procedure Laterality Date   CORONARY STENT PLACEMENT     TONSILLECTOMY     Family History  Problem Relation Age of Onset   Pancreatic cancer Mother    Heart attack Father    Heart disease Brother    Social History   Tobacco Use   Smoking status: Never   Smokeless tobacco: Never  Vaping Use   Vaping status: Never Used  Substance Use Topics   Alcohol use: No   Drug use:  No   Current Outpatient Medications  Medication Sig Dispense Refill   ACCU-CHEK AVIVA PLUS test strip USE TO TEST BLOOD SUGARS FOUR TIMES DAILY DX E11.9 AND Z79.4  1   Accu-Chek FastClix Lancets MISC Use to monitor blood glucose 4 time(s) daily DX CODE E11.9 and Z79.4     amiodarone (PACERONE) 200 MG tablet Take 200 mg by mouth daily.     amLODipine (NORVASC) 5 MG tablet TK 1 T PO QAM  0   apixaban (ELIQUIS) 5 MG TABS tablet Take 5 mg by mouth 2 (two) times daily.     cholecalciferol (VITAMIN D) 1000 UNITS tablet Take 2,000 Units by mouth daily.     dapagliflozin propanediol (FARXIGA) 10 MG TABS tablet Take 10 mg by mouth daily.     donepezil (ARICEPT) 10 MG tablet Take 10 mg by  mouth at bedtime.     furosemide (LASIX) 40 MG tablet Take 40 mg by mouth daily.     Insulin Syringe-Needle U-100 (INSULIN SYRINGE 1CC/31GX5/16") 31G X 5/16" 1 ML MISC Use with insulin five (5) times daily  DX E11.42 and Z79.4     iron polysaccharides (NIFEREX) 150 MG capsule Take 1 capsule (150 mg total) by mouth daily. 30 capsule 0   pravastatin (PRAVACHOL) 20 MG tablet Take 20 mg by mouth daily.  0   ranolazine (RANEXA) 500 MG 12 hr tablet Take 500 mg by mouth 2 (two) times daily.  11   TOUJEO SOLOSTAR 300 UNIT/ML Solostar Pen Inject 25 Units into the skin in the morning and at bedtime.     TRADJENTA 5 MG TABS tablet      traZODone (DESYREL) 100 MG tablet Take 100 mg by mouth at bedtime.     nitroGLYCERIN (NITROSTAT) 0.4 MG SL tablet Place 0.4 mg under the tongue every 5 (five) minutes as needed for chest pain. (Patient not taking: Reported on 09/06/2023)     No current facility-administered medications for this visit.   Allergies  Allergen Reactions   Sunflower Oil Anaphylaxis    Swelling of throat Swelling of throat Swelling of throat-any sunflower product Swelling of throat-any sunflower product    Bupropion Other (See Comments)    Tremors   Glucophage [Metformin Hcl] Diarrhea   Ozempic (0.25 Or 0.5  Mg-Dose) [Semaglutide(0.25 Or 0.5mg -Dos)]    Semaglutide Other (See Comments)     Review of Systems: All systems reviewed and negative except where noted in HPI.    ECHOCARDIOGRAM COMPLETE  Result Date: 09/02/2023    ECHOCARDIOGRAM REPORT   Patient Name:   Demetrio BEHR SLAYTON Date of Exam: 09/02/2023 Medical Rec #:  [id number]    Height:       68.0 in Accession #:    [id number]   Weight:       232.8 lb Date of Birth:  June 17, 1951    BSA:          2.180 m Patient Age:    72 years     BP:           109/39 mmHg Patient Gender: M            HR:           47 bpm. Exam Location:  Inpatient Procedure: 2D Echo, Color Doppler, Cardiac Doppler and Intracardiac            Opacification Agent Indications:    CHF  History:        Patient has no prior history of Echocardiogram examinations.                 CAD, CKD and Carotid Disease, Aortic Valve Disease,                 Arrythmias:Atrial Fibrillation; Risk Factors:Diabetes, Sleep                 Apnea, Dyslipidemia and Hypertension.  Sonographer:    [facility] Referring Phys: [id number] Cecille Po MELVIN IMPRESSIONS  1. Left ventricular ejection fraction, by estimation, is 60 to 65%. The left ventricle has normal function. The left ventricle has no regional wall motion abnormalities. There is mild left ventricular hypertrophy. Left ventricular diastolic parameters are indeterminate.  2. Right ventricular systolic function is normal. The right ventricular size is normal. Tricuspid regurgitation signal is inadequate for assessing PA pressure.  3. Left atrial size was mildly  dilated.  4. Right atrial size was mildly dilated.  5. The mitral valve is degenerative. Trivial mitral valve regurgitation.  6. There is moderate calcification of the aortic valve. Aortic valve regurgitation is not visualized. Mild to moderate aortic valve stenosis.  7. The inferior vena cava is normal in size with greater than 50% respiratory variability, suggesting right atrial pressure of 3 mmHg. Comparison(s):  No prior Echocardiogram. FINDINGS  Left Ventricle: Left ventricular ejection fraction, by estimation, is 60 to 65%. The left ventricle has normal function. The left ventricle has no regional wall motion abnormalities. Definity contrast agent was given IV to delineate the left ventricular  endocardial borders. The left ventricular internal cavity size was normal in size. There is mild left ventricular hypertrophy. Left ventricular diastolic parameters are indeterminate. Right Ventricle: The right ventricular size is normal. Right ventricular systolic function is normal. Tricuspid regurgitation signal is inadequate for assessing PA pressure. Left Atrium: Left atrial size was mildly dilated. Right Atrium: Right atrial size was mildly dilated. Pericardium: There is no evidence of pericardial effusion. Mitral Valve: The mitral valve is degenerative in appearance. Trivial mitral valve regurgitation. MV peak gradient, 9.5 mmHg. The mean mitral valve gradient is 2.0 mmHg. Tricuspid Valve: Tricuspid valve regurgitation is not demonstrated. Aortic Valve: There is moderate calcification of the aortic valve. Aortic valve regurgitation is not visualized. Mild to moderate aortic stenosis is present. Aortic valve mean gradient measures 18.8 mmHg. Aortic valve peak gradient measures 32.1 mmHg. Aortic valve area, by VTI measures 1.18 cm. Pulmonic Valve: Pulmonic valve regurgitation is not visualized. Aorta: The aortic root and ascending aorta are structurally normal, with no evidence of dilitation. Venous: The inferior vena cava is normal in size with greater than 50% respiratory variability, suggesting right atrial pressure of 3 mmHg. IAS/Shunts: No atrial level shunt detected by color flow Doppler.  LEFT VENTRICLE PLAX 2D LVIDd:         5.40 cm   Diastology LVIDs:         2.80 cm   LV e' medial:    6.31 cm/s LV PW:         1.40 cm   LV E/e' medial:  21.4 LV IVS:        1.10 cm   LV e' lateral:   12.60 cm/s LVOT diam:     2.00 cm    LV E/e' lateral: 10.7 LV SV:         92 LV SV Index:   42 LVOT Area:     3.14 cm  RIGHT VENTRICLE RV Basal diam:  4.40 cm RV Mid diam:    3.00 cm LEFT ATRIUM              Index        RIGHT ATRIUM           Index LA diam:        4.10 cm  1.88 cm/m   RA Area:     23.50 cm LA Vol (A2C):   112.0 ml 51.38 ml/m  RA Volume:   77.80 ml  35.69 ml/m LA Vol (A4C):   83.1 ml  38.12 ml/m LA Biplane Vol: 99.1 ml  45.46 ml/m  AORTIC VALVE AV Area (Vmax):    1.19 cm AV Area (Vmean):   1.12 cm AV Area (VTI):     1.18 cm AV Vmax:           283.20 cm/s AV Vmean:  203.400 cm/s AV VTI:            0.786 m AV Peak Grad:      32.1 mmHg AV Mean Grad:      18.8 mmHg LVOT Vmax:         107.00 cm/s LVOT Vmean:        72.800 cm/s LVOT VTI:          0.294 m LVOT/AV VTI ratio: 0.37  AORTA Ao Root diam: 3.30 cm Ao Asc diam:  3.10 cm MITRAL VALVE MV Area (PHT): 3.50 cm     SHUNTS MV Area VTI:   1.68 cm     Systemic VTI:  0.29 m MV Peak grad:  9.5 mmHg     Systemic Diam: 2.00 cm MV Mean grad:  2.0 mmHg MV Vmax:       1.54 m/s MV Vmean:      65.9 cm/s MV Decel Time: 217 msec MV E velocity: 135.00 cm/s MV A velocity: 53.50 cm/s MV E/A ratio:  2.52 Photographer signed by Carolan Clines Signature Date/Time: 09/02/2023/3:36:49 PM    Final    CT ABDOMEN PELVIS WO CONTRAST  Result Date: 09/01/2023 CLINICAL DATA:  Acute non localized epigastric abdominal and mid back pain. EXAM: CT ABDOMEN AND PELVIS WITHOUT CONTRAST TECHNIQUE: Multidetector CT imaging of the abdomen and pelvis was performed following the standard protocol without IV contrast. RADIATION DOSE REDUCTION: This exam was performed according to the departmental dose-optimization program which includes automated exposure control, adjustment of the mA and/or kV according to patient size and/or use of iterative reconstruction technique. COMPARISON:  None Available. FINDINGS: Lower chest: Limited visualization of the lower thorax demonstrates a trace bilateral  pleural effusions, right-greater-than-left. Limited visualization of the lower thorax demonstrates punctate granuloma Normal heart size. Coronary artery calcifications. Calcifications involving the aortic leaflets. Hepatobiliary: Normal hepatic contour. There are multiple layering gallstones within the gallbladder with potential trace amount of pericholecystic fluid. No ascites. Pancreas: The pancreas is largely fatty replaced. Spleen: The spleen is somewhat atrophic but otherwise normal appearance. Adrenals/Urinary Tract: Normal noncontrast appearance of the bilateral kidneys. Extensive calcifications about the bilateral renal hila are favored to be vascular in etiology. No evidence of nephrolithiasis. There is a 1.9 cm hypoattenuating lesion within the medial aspect of the left kidney (coronal image 41, series 5), which is incompletely characterized on this noncontrast examination though favored to represent a renal cyst. Normal noncontrast appearance of the left kidney. There is a minimal amount of grossly symmetric bilateral perinephric stranding without evidence of urinary obstruction. Normal appearance of the bilateral adrenal glands. Normal appearance of the urinary bladder given degree of distention. Stomach/Bowel: Moderate to large colonic stool burden without evidence of enteric obstruction. Normal appearance of the terminal ileum. The appendix is not visualized however there is no pericecal inflammatory change. Small hiatal hernia. No pneumoperitoneum, pneumatosis or portal venous gas. Vascular/Lymphatic: Scattered atherosclerotic plaque within a normal caliber abdominal aorta. There are extensive thin mural calcifications involving the mesenteric vessels of the abdomen suggestive of advanced diabetes. No bulky retroperitoneal, mesenteric, pelvic or inguinal lymphadenopathy. Reproductive: Dystrophic calcifications within a normal-sized prostate gland. Trace amount of fluid within the pelvic cul-de-sac.  Other: Diffuse body wall anasarca. Musculoskeletal: No acute or aggressive osseous abnormalities. 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 IMPRESSION: 1. Cholelithiasis with potential trace amount of pericholecystic fluid, nonspecific in the setting of diffuse body wall anasarca though could be seen in the setting of acute cholecystitis. Clinical correlation  is advised. Further evaluation with right upper quadrant ultrasound could be performed as indicated. 2. Cardiomegaly with suspected mild pulmonary edema with trace bilateral effusions and diffuse body wall anasarca. 3. Moderate to large colonic stool burden without evidence of enteric obstruction. 4. Extensive thin mural calcifications involving the mesenteric vessels of the abdomen suggestive of advanced diabetes. 5. Aortic Atherosclerosis (ICD10-I70.0). Electronically Signed   By: Simonne Come M.D.   On: 09/01/2023 14:36   US Abdomen Limited RUQ (LIVER/GB)  Result Date: 09/01/2023 CLINICAL DATA:  Right upper quadrant pain for 2 weeks. EXAM: ULTRASOUND ABDOMEN LIMITED RIGHT UPPER QUADRANT COMPARISON:  None Available. FINDINGS: Gallbladder: Gallbladder is mildly distended with numerous layering stones. No wall thickening or adjacent fluid. Common bile duct: Diameter: 3 mm Liver: No focal lesion identified. Within normal limits in parenchymal echogenicity. Portal vein is patent on color Doppler imaging with normal direction of blood flow towards the liver. Other: Small right pleural effusion IMPRESSION: Gallstones. No further sonographic evidence of acute cholecystitis. No ductal dilatation. Small right pleural effusion Electronically Signed   By: Karen Kays M.D.   On: 09/01/2023 13:18   DG Chest Port 1 View  Result Date: 09/01/2023 CLINICAL DATA:  Shortness of breath. EXAM: PORTABLE CHEST 1 VIEW COMPARISON:  06/18/2022 FINDINGS: Unchanged cardiomegaly. Small right pleural effusion. There is slight interstitial thickening that may  represent pulmonary edema. No confluent airspace disease. No pneumothorax. IMPRESSION: Cardiomegaly with small right pleural effusion. Slight interstitial thickening that may represent pulmonary edema. Findings typical of CHF. Electronically Signed   By: Narda Rutherford M.D.   On: 09/01/2023 13:05    Lab Results  Component Value Date   WBC 6.6 09/04/2023   HGB 10.3 (L) 09/04/2023   HCT 32.2 (L) 09/04/2023   MCV 82.1 09/04/2023   PLT 280 09/04/2023       Latest Ref Rng & Units 09/04/2023    3:33 AM 09/03/2023    3:56 AM 09/02/2023    3:14 AM  CBC  WBC 4.0 - 10.5 K/uL 6.6  6.4  7.3   Hemoglobin 13.0 - 17.0 g/dL 29.5  18.8  7.7   Hematocrit 39.0 - 52.0 % 32.2  31.5  24.3   Platelets 150 - 400 K/uL 280  259  232     Lab Results  Component Value Date   IRON 13 (L) 09/02/2023   TIBC 371 09/02/2023   FERRITIN 7 (L) 09/02/2023    Lab Results  Component Value Date   ALT 19 09/03/2023   AST 16 09/03/2023   ALKPHOS 71 09/03/2023   BILITOT 1.0 09/03/2023    Lab Results  Component Value Date   NA 140 09/04/2023   CL 103 09/04/2023   K 3.8 09/04/2023   CO2 26 09/04/2023   BUN 32 (H) 09/04/2023   CREATININE 2.21 (H) 09/04/2023   GFRNONAA 30 (L) 09/04/2023   CALCIUM 9.0 09/04/2023   ALBUMIN 3.2 (L) 09/03/2023   GLUCOSE 97 09/04/2023      Physical Exam: BP (!) 92/52 (BP Location: Left Arm, Patient Position: Sitting, Cuff Size: Normal)   Pulse (!) 48   Ht 5\' 7"  (1.702 m) Comment: height measured without shoes  Wt 218 lb 6 oz (99.1 kg)   BMI 34.20 kg/m  Constitutional: Pleasant,male in no acute distress. HEENT: Normocephalic and atraumatic. Conjunctivae are normal. No scleral icterus. Neck supple.  Cardiovascular: Normal rate, regular rhythm.  Pulmonary/chest: Effort normal and breath sounds normal.  Abdominal: Soft, nondistended, nontender.  There are no  masses palpable. Extremities: no edema Lymphadenopathy: No cervical adenopathy noted. Neurological: Alert and  oriented to person place and time. Skin: Skin is warm and dry. No rashes noted. Psychiatric: Normal mood and affect. Behavior is normal.   ASSESSMENT: 72 y.o. male here for assessment of the following  1. Iron deficiency anemia, unspecified iron deficiency anemia type   2. Anticoagulated   3. Acute on chronic diastolic (congestive) heart failure (HCC)   4. Constipation, unspecified constipation type    As above, iron deficiency anemia in the setting of Eliquis which progressed in recent months, culminated with a hemoglobin of 7.7 and led to hospitalization for heart failure.  He has since been managed with diuretics, aggressively diuresed and his respiratory status has significantly improved and he is feeling much better.  He has had no overt bleeding that he is aware of.  He likely had a slow downtrend over time in the setting of anticoagulation.  He has constipation at baseline and passing brown stools.  We discussed differential diagnosis for iron deficiency anemia in the setting of anticoagulation.  He does have a history of colon polyps and is very overdue for colonoscopy.  He also has some occasional postprandial upper abdominal discomfort, gastritis/PUD on the differential as well as gallstones to be causing the symptoms.  He does warrant an EGD and colonoscopy to evaluate this.  We discussed what those procedures entailed, risk of the exams and that of anesthesia.  I do think he has significantly improved from his recent hospitalization and given his echocardiogram findings I do think we can get his case safely done at the [facility], however, we will discuss his case with our anesthesia team to make sure they are comfortable with this.  If so, plan on performing his procedures in the next few weeks.  He will need approval to hold his Eliquis for 2 days prior to the exam.  In the interim he should continue oral iron supplement.  We will repeat a CBC next week to make sure his hemoglobin is  stable posthospitalization and IV iron infusion.  For his constipation recommend he increase MiraLAX to twice daily dosing and continue his senna.  Will give him standard bowel preparation additional half MiraLAX prep to make sure he is adequately prepped for this exam.  Hopefully we are able to identify source of his anemia/bleeding on this exam, counseled him on potential need for capsule endoscopy if these exams failed to show course or he should not respond to oral/IV iron.  They agree with the plan, they will let me know if any questions in the interim.  I will place empirically on omeprazole 20 mg daily until his procedure is done to cover for possible PUD.  PLAN: - schedule EGD and colonoscopy at the [facility] - in November - will review his case with our anesthesia team given recent hospitalization, but echocardiogram reassuring - will need clearance from his cardiologist to hold Eliquis for 2 days prior to the exam - continue oral iron - start empiric omeprazole 20mg  daily until his endoscopic procedures are done - increase MIralax to twice daily in addition to his senna - standard prep + 1/2 Miralax purge before hand - repeat CBC next week to ensure stable post IV iron / hospitalization - call in the interim with questions / concerns.  Harlin Rain, MD [facility]  CC: [name], MD

## 2023-09-06 NOTE — Patient Instructions (Addendum)
You have been scheduled for an endoscopy and colonoscopy. Please follow the written instructions given to you at your visit today.  Please pick up your prep supplies at the pharmacy within the next 1-3 days.  If you use inhalers (even only as needed), please bring them with you on the day of your procedure.  DO NOT TAKE 7 DAYS PRIOR TO TEST- Trulicity (dulaglutide) Ozempic, Wegovy (semaglutide) Mounjaro (tirzepatide) Bydureon Bcise (exanatide extended release)  DO NOT TAKE 1 DAY PRIOR TO YOUR TEST Rybelsus (semaglutide) Adlyxin (lixisenatide) Victoza (liraglutide) Byetta (exanatide) ___________________________________________________________________________   Bonita Quin will be contacted by our office prior to your procedure for directions on holding your ELIQUIS.  If you do not hear from our office 1 week prior to your scheduled procedure, please call [phone] to discuss.   Continue oral iron.  We have sent the following medications to your pharmacy for you to pick up at your convenience: Omeprazole 20 mg: Take once daily  Please purchase the following medications over the counter and take as directed: Miralax: Take TWICE DAILY  Thank you for entrusting me with your care and for choosing [facility], Dr. Ileene Patrick     If your blood pressure at your visit was 140/90 or greater, please contact your primary care physician to follow up on this. ______________________________________________________  If you are age 66 or older, your body mass index should be between 23-30. Your Body mass index is 34.2 kg/m. If this is out of the aforementioned range listed, please consider follow up with your Primary Care Provider.  If you are age 66 or younger, your body mass index should be between 19-25. Your Body mass index is 34.2 kg/m. If this is out of the aformentioned range listed, please consider follow up with your Primary Care Provider.   ________________________________________________________  The [facility] providers would like to encourage you to use [facility] to communicate with providers for non-urgent requests or questions.  Due to long hold times on the telephone, sending your provider a message by [facility] may be a faster and more efficient way to get a response.  Please allow 48 business hours for a response.  Please remember that this is for non-urgent requests.  _______________________________________________________  Due to recent changes in healthcare laws, you may see the results of your imaging and laboratory studies on MyChart before your provider has had a chance to review them.  We understand that in some cases there may be results that are confusing or concerning to you. Not all laboratory results come back in the same time frame and the provider may be waiting for multiple results in order to interpret others.  Please give [place] 48 hours in order for your provider to thoroughly review all the results before contacting the office for clarification of your results.

## 2023-09-06 NOTE — Telephone Encounter (Signed)
George Boyer [birth year] [id number]  Dear Dr. Sampson Goon:  We have scheduled the above named patient for a(n) EGD/Colonoscopy procedure with Dr. Ileene Patrick. Our records show that (s)he is on anticoagulation therapy.  Please advise as to whether the patient may come off their therapy of Eliquis 2 days prior to their procedure which is scheduled for 10-05-23.  Please route your response to Lucius Conn, CMA or fax response to [phone]. We can be reached at [phone]. Thank you for your prompt reply.  Sincerely,    [facility]

## 2023-09-07 NOTE — Telephone Encounter (Signed)
Received fax from [facility], Gayla Medicus, NP:  "Patient has been maintaining sinus rhythm. Low risk to hold doac. May hold prior to procedure 48 hours".  Note: procedure rescheduled to Monday, 10-02-23  Called and left message for patient to hold for 48 hours prior to procedure. Called and spoke to patient's wife, Corrie Dandy.  She indicated he takes Eliquis morning and evening. She understands he can take it the morning of the 9th but not that evening or at all on the 10th.  Dr. Adela Lank will advise him when to resume after the procedure on the 11th. She confirmed understanding

## 2023-09-13 ENCOUNTER — Other Ambulatory Visit (INDEPENDENT_AMBULATORY_CARE_PROVIDER_SITE_OTHER): Payer: Medicare Other

## 2023-09-13 DIAGNOSIS — I5033 Acute on chronic diastolic (congestive) heart failure: Secondary | ICD-10-CM | POA: Diagnosis not present

## 2023-09-13 DIAGNOSIS — Z7901 Long term (current) use of anticoagulants: Secondary | ICD-10-CM | POA: Diagnosis not present

## 2023-09-13 DIAGNOSIS — K5900 Constipation, unspecified: Secondary | ICD-10-CM | POA: Diagnosis not present

## 2023-09-13 DIAGNOSIS — D509 Iron deficiency anemia, unspecified: Secondary | ICD-10-CM | POA: Diagnosis not present

## 2023-09-13 LAB — CBC WITH DIFFERENTIAL/PLATELET
Basophils Absolute: 0.1 10*3/uL (ref 0.0–0.1)
Basophils Relative: 1 % (ref 0.0–3.0)
Eosinophils Absolute: 0.1 10*3/uL (ref 0.0–0.7)
Eosinophils Relative: 2 % (ref 0.0–5.0)
HCT: 33.5 % — ABNORMAL LOW (ref 39.0–52.0)
Hemoglobin: 10.5 g/dL — ABNORMAL LOW (ref 13.0–17.0)
Lymphocytes Relative: 31.5 % (ref 12.0–46.0)
Lymphs Abs: 2.3 10*3/uL (ref 0.7–4.0)
MCHC: 31.4 g/dL (ref 30.0–36.0)
MCV: 84.6 fL (ref 78.0–100.0)
Monocytes Absolute: 1.3 10*3/uL — ABNORMAL HIGH (ref 0.1–1.0)
Monocytes Relative: 17.4 % — ABNORMAL HIGH (ref 3.0–12.0)
Neutro Abs: 3.5 10*3/uL (ref 1.4–7.7)
Neutrophils Relative %: 48.1 % (ref 43.0–77.0)
Platelets: 309 10*3/uL (ref 150.0–400.0)
RBC: 3.96 Mil/uL — ABNORMAL LOW (ref 4.22–5.81)
RDW: 18.4 % — ABNORMAL HIGH (ref 11.5–15.5)
WBC: 7.3 10*3/uL (ref 4.0–10.5)

## 2023-09-15 ENCOUNTER — Encounter: Payer: Self-pay | Admitting: Gastroenterology

## 2023-10-02 ENCOUNTER — Encounter: Payer: Self-pay | Admitting: Gastroenterology

## 2023-10-02 ENCOUNTER — Ambulatory Visit: Payer: Medicare Other | Admitting: Gastroenterology

## 2023-10-02 VITALS — BP 133/48 | HR 54 | Temp 97.5°F | Resp 15 | Ht 67.0 in | Wt 218.0 lb

## 2023-10-02 DIAGNOSIS — D509 Iron deficiency anemia, unspecified: Secondary | ICD-10-CM | POA: Diagnosis not present

## 2023-10-02 DIAGNOSIS — K3189 Other diseases of stomach and duodenum: Secondary | ICD-10-CM

## 2023-10-02 DIAGNOSIS — D124 Benign neoplasm of descending colon: Secondary | ICD-10-CM

## 2023-10-02 DIAGNOSIS — Z860100 Personal history of colon polyps, unspecified: Secondary | ICD-10-CM

## 2023-10-02 MED ORDER — SODIUM CHLORIDE 0.9 % IV SOLN: 500.0000 mL | Freq: Once | INTRAVENOUS | Status: DC

## 2023-10-02 NOTE — Op Note (Signed)
[facility] Patient Name: George Boyer Procedure Date: 10/02/2023 2:14 PM MRN: [id number] Endoscopist: Viviann Spare P. Adela Lank , MD, [id number] Age: 72 Referring MD:  Date of Birth: 1951/02/02 Gender: Male Account #: [id number] Procedure:                Colonoscopy Indications:              Iron deficiency anemia in the setting of Eliquis                            use - no overt bleeding - history of polyps, last                            exam age 39. Medicines:                Monitored Anesthesia Care Procedure:                Pre-Anesthesia Assessment:                           - Prior to the procedure, a History and Physical                            was performed, and patient medications and                            allergies were reviewed. The patient's tolerance of                            previous anesthesia was also reviewed. The risks                            and benefits of the procedure and the sedation                            options and risks were discussed with the patient.                            All questions were answered, and informed consent                            was obtained. Prior Anticoagulants: The patient has                            taken Eliquis (apixaban), last dose was 2 days                            prior to procedure. ASA Grade Assessment: III - A                            patient with severe systemic disease. After                            reviewing the risks and benefits, the patient was  deemed in satisfactory condition to undergo the                            procedure.                           After obtaining informed consent, the colonoscope                            was passed under direct vision. Throughout the                            procedure, the patient's blood pressure, pulse, and                            oxygen saturations were monitored continuously. The                             Olympus Scope [id number] was introduced through the                            anus and advanced to the the cecum, identified by                            appendiceal orifice and ileocecal valve. The                            colonoscopy was performed without difficulty. The                            patient tolerated the procedure well. The quality                            of the bowel preparation was inadequate. The                            ileocecal valve, appendiceal orifice, and rectum                            were photographed. Scope In: 2:48:25 PM Scope Out: 3:04:04 PM Scope Withdrawal Time: 0 hours 6 minutes 38 seconds  Total Procedure Duration: 0 hours 15 minutes 39 seconds  Findings:                 The perianal and digital rectal examinations were                            normal.                           A 5 mm polyp was found in the descending colon. The                            polyp was sessile with stigmata of recent bleeding.  The polyp was removed with a cold snare. Resection                            and retrieval were complete.                           Scattered small-mouthed diverticula were found in                            the sigmoid colon.                           A large amount of semi-liquid stool was found in                            the entire colon, making visualization difficult. I                            removed the polyp in the left colon during cecal                            intubation, unfortunately prep became worse more                            proximally. Lavage of the colon was performed using                            copious amounts of sterile water, resulting in                            incomplete clearance with fair visualization. The                            scope clogged on multiple occurances, using brush                            multiple times to open the channel of the                             colonoscope.                           Multiple polyps were noted in the transverse and                            right colon but were not removed due to poor prep.                            The exam was otherwise without abnormality of what                            was visualized. Dependant portions not well  visualized however no obvious large polyps or mass                            lesions noted. Complications:            No immediate complications. Estimated blood loss:                            Minimal. Estimated Blood Loss:     Estimated blood loss was minimal. Impression:               - Preparation of the colon was inadequate. I                            attempted to lavage and clear the colon but prep                            became worse, scope clogged multiple times -                            unfortunately had to abort the procedure.                           - One 5 mm polyp in the descending colon with fresh                            heme noted overlying it, removed with a cold snare.                            Resected and retrieved.                           - The examination was otherwise normal.                           Unclear if descending polyp is related to his                            anemia (fresh heme overlying it) but could not                            clear his colon due to poor prep. No obvious cause                            on EGD for IDA. Recommendation:           - Patient has a contact number available for                            emergencies. The signs and symptoms of potential                            delayed complications were discussed with the  patient. Return to normal activities tomorrow.                            Written discharge instructions were provided to the                            patient.                           - Patient needs additional  prep to complete                            colonoscopy. Either can do another prep tonight in                            hopes of performing a colonoscopy tomorrow if there                            is room to accomodate him in our schedule, or                            rebook at another date using 2 day prep. He has                            held Eliquis for this. Resumption of this will be                            determined by when he can do another colonoscopy,                            will discuss with the patient post procedure                           - Clear liquid diet today, prep tonight, for                            colonoscopy tomorrow if the patient is willing                           - Continue present medications. Viviann Spare P. Shimon Trowbridge, MD 10/02/2023 3:14:48 PM This report has been signed electronically.

## 2023-10-02 NOTE — Progress Notes (Signed)
Called to room to assist during endoscopic procedure.  [name] and intended procedure confirmed with present staff. Received instructions for my participation in the procedure from the performing physician.  

## 2023-10-02 NOTE — Patient Instructions (Signed)
Please read handouts provided. Continue present medications. Await pathology results. Clear liquid diet today, prep tonight. Colonoscopy tomorrow 10/03/2023.  YOU HAD AN ENDOSCOPIC PROCEDURE TODAY AT THE  ENDOSCOPY CENTER:   Refer to the procedure report that was given to you for any specific questions about what was found during the examination.  If the procedure report does not answer your questions, please call your gastroenterologist to clarify.  If you requested that your care partner not be given the details of your procedure findings, then the procedure report has been included in a sealed envelope for you to review at your convenience later.  YOU SHOULD EXPECT: Some feelings of bloating in the abdomen. Passage of more gas than usual.  Walking can help get rid of the air that was put into your GI tract during the procedure and reduce the bloating. If you had a lower endoscopy (such as a colonoscopy or flexible sigmoidoscopy) you may notice spotting of blood in your stool or on the toilet paper. If you underwent a bowel prep for your procedure, you may not have a normal bowel movement for a few days.  Please Note:  You might notice some irritation and congestion in your nose or some drainage.  This is from the oxygen used during your procedure.  There is no need for concern and it should clear up in a day or so.  SYMPTOMS TO REPORT IMMEDIATELY:  Following lower endoscopy (colonoscopy or flexible sigmoidoscopy):  Excessive amounts of blood in the stool  Significant tenderness or worsening of abdominal pains  Swelling of the abdomen that is new, acute  Fever of 100F or higher  Following upper endoscopy (EGD)  Vomiting of blood or coffee ground material  New chest pain or pain under the shoulder blades  Painful or persistently difficult swallowing  New shortness of breath  Fever of 100F or higher  Black, tarry-looking stools  For urgent or emergent issues, a gastroenterologist  can be reached at any hour by calling [phone]. Do not use MyChart messaging for urgent concerns.    DIET:  W  Drink plenty of fluids but you should avoid alcoholic beverages for 24 hours.  ACTIVITY:  You should plan to take it easy for the rest of today and you should NOT DRIVE or use heavy machinery until tomorrow (because of the sedation medicines used during the test).    FOLLOW UP: Our staff will call the number listed on your records the next business day following your procedure.  We will call around 7:15- 8:00 am to check on you and address any questions or concerns that you may have regarding the information given to you following your procedure. If we do not reach you, we will leave a message.     If any biopsies were taken you will be contacted by phone or by letter within the next 1-3 weeks.  Please call us at [phone] if you have not heard about the biopsies in 3 weeks.    SIGNATURES/CONFIDENTIALITY: You and/or your care partner have signed paperwork which will be entered into your electronic medical record.  These signatures attest to the fact that that the information above on your After Visit Summary has been reviewed and is understood.  Full responsibility of the confidentiality of this discharge information lies with you and/or your care-partner.

## 2023-10-02 NOTE — Progress Notes (Signed)
Vitals-George Boyer  Pt's states no medical or surgical changes since previsit or office visit.  Patient is incapable of telling us when he took his meds.  His wife answered all of the medication questions.

## 2023-10-02 NOTE — Op Note (Signed)
[facility] Patient Name: George Boyer Procedure Date: 10/02/2023 2:14 PM MRN: [id number] Endoscopist: Viviann Spare P. Adela Lank , MD, [id number] Age: 72 Referring MD:  Date of Birth: May 20, 1951 Gender: Male Account #: [id number] Procedure:                Upper GI endoscopy Indications:              Iron deficiency anemia in the setting of Eliquis use Medicines:                Monitored Anesthesia Care Procedure:                Pre-Anesthesia Assessment:                           - Prior to the procedure, a History and Physical                            was performed, and patient medications and                            allergies were reviewed. The patient's tolerance of                            previous anesthesia was also reviewed. The risks                            and benefits of the procedure and the sedation                            options and risks were discussed with the patient.                            All questions were answered, and informed consent                            was obtained. Prior Anticoagulants: The patient has                            taken Eliquis (apixaban), last dose was 2 days                            prior to procedure. ASA Grade Assessment: III - A                            patient with severe systemic disease. After                            reviewing the risks and benefits, the patient was                            deemed in satisfactory condition to undergo the                            procedure.  After obtaining informed consent, the endoscope was                            passed under direct vision. Throughout the                            procedure, the patient's blood pressure, pulse, and                            oxygen saturations were monitored continuously. The                            Olympus Scope SN [id number] was introduced through the                            mouth, and advanced to  the second part of duodenum.                            The upper GI endoscopy was accomplished without                            difficulty. The patient tolerated the procedure                            well. Scope In: Scope Out: Findings:                 Esophagogastric landmarks were identified: the                            Z-line was found at 38 cm, the gastroesophageal                            junction was found at 38 cm and the upper extent of                            the gastric folds was found at 40 cm from the                            incisors.                           A 2 cm hiatal hernia was present.                           The exam of the esophagus was otherwise normal.                           The entire examined stomach was normal. Biopsies                            were taken with a cold forceps for Helicobacter  pylori testing.                           The examined duodenum was normal. Biopsies for                            histology were taken with a cold forceps for                            evaluation of celiac disease. Complications:            No immediate complications. Estimated blood loss:                            Minimal. Estimated Blood Loss:     Estimated blood loss was minimal. Impression:               - Esophagogastric landmarks identified.                           - 2 cm hiatal hernia.                           - Normal esophagus otherwise.                           - Normal stomach. Biopsied.                           - Normal examined duodenum. Biopsied.                           No overt cause on EGD for iron deficiency. Recommendation:           - Patient has a contact number available for                            emergencies. The signs and symptoms of potential                            delayed complications were discussed with the                            patient. Return to normal activities  tomorrow.                            Written discharge instructions were provided to the                            patient.                           - Diet per colonoscopy note                           - Continue present medications.                           -  Eliquis resumption pending discussion with the                            patient. If he is willing to do colonoscopy                            tomorrow then continue to hold Eliquis. If he                            cannot do repeat colonoscopy tomorrow, then resume                            Eliquis tomorrow given colon polyp removed today.                           - Await pathology results. Viviann Spare P. Hutton Pellicane, MD 10/02/2023 3:18:47 PM This report has been signed electronically.

## 2023-10-02 NOTE — Progress Notes (Signed)
History and Physical Interval Note: Seen  on 10/16 - no interval changes. EGD and colonoscopy today for evaluation of iron deficiency anemia, he also has a history of polyps and is overdue for colonoscopy. Has been off Eliquis for 2 days prior to this exam. Hgb last checked 09/13/23 - has risen to 10s.  Otherwise feels well today without complaints. Last colonoscopy age 72.     10/02/2023 2:16 PM  George Boyer  has presented today for endoscopic procedure(s), with the diagnosis of  Encounter Diagnoses  Name Primary?   Iron deficiency anemia, unspecified iron deficiency anemia type Yes   History of colon polyps   .  The various methods of evaluation and treatment have been discussed with the patient and/or family. After consideration of risks, benefits and other options for treatment, the patient has consented to  the endoscopic procedure(s).   The patient's history has been reviewed, patient examined, no change in status, stable for surgery.  I have reviewed the patient's chart and labs.  Questions were answered to the patient's satisfaction.    Harlin Rain, MD [facility]'

## 2023-10-02 NOTE — Progress Notes (Signed)
A/ox3, pleased with MAC, report to RN 

## 2023-10-03 ENCOUNTER — Encounter: Payer: Self-pay | Admitting: Gastroenterology

## 2023-10-03 ENCOUNTER — Telehealth: Payer: Self-pay

## 2023-10-03 ENCOUNTER — Ambulatory Visit: Payer: Medicare Other | Admitting: Gastroenterology

## 2023-10-03 VITALS — BP 142/50 | HR 56 | Temp 98.1°F | Resp 17 | Ht 67.0 in | Wt 218.6 lb

## 2023-10-03 DIAGNOSIS — D123 Benign neoplasm of transverse colon: Secondary | ICD-10-CM | POA: Diagnosis not present

## 2023-10-03 DIAGNOSIS — D122 Benign neoplasm of ascending colon: Secondary | ICD-10-CM | POA: Diagnosis not present

## 2023-10-03 DIAGNOSIS — K635 Polyp of colon: Secondary | ICD-10-CM | POA: Diagnosis not present

## 2023-10-03 DIAGNOSIS — K5900 Constipation, unspecified: Secondary | ICD-10-CM

## 2023-10-03 DIAGNOSIS — D124 Benign neoplasm of descending colon: Secondary | ICD-10-CM

## 2023-10-03 DIAGNOSIS — D120 Benign neoplasm of cecum: Secondary | ICD-10-CM

## 2023-10-03 DIAGNOSIS — D509 Iron deficiency anemia, unspecified: Secondary | ICD-10-CM

## 2023-10-03 MED ORDER — SODIUM CHLORIDE 0.9 % IV SOLN: 500.0000 mL | Freq: Once | INTRAVENOUS | Status: DC

## 2023-10-03 NOTE — Progress Notes (Signed)
See 09/06/2023 H&P no changes

## 2023-10-03 NOTE — Patient Instructions (Addendum)
-   Resume Eliquis (apixaban) in 3 days at prior dose. Refer to managing physician for further adjustment of therapy. - Resume previous diet adding high fiber. - Continue present medications. - Await pathology results. - No aspirin, ibuprofen, naproxen, or other non-steroidal anti-inflammatory drugs for 2 weeks after polyp removal.  YOU HAD AN ENDOSCOPIC PROCEDURE TODAY AT THE [facility]:   Refer to the procedure report that was given to you for any specific questions about what was found during the examination.  If the procedure report does not answer your questions, please call your gastroenterologist to clarify.  If you requested that your care partner not be given the details of your procedure findings, then the procedure report has been included in a sealed envelope for you to review at your convenience later.  YOU SHOULD EXPECT: Some feelings of bloating in the abdomen. Passage of more gas than usual.  Walking can help get rid of the air that was put into your GI tract during the procedure and reduce the bloating. If you had a lower endoscopy (such as a colonoscopy or flexible sigmoidoscopy) you may notice spotting of blood in your stool or on the toilet paper. If you underwent a bowel prep for your procedure, you may not have a normal bowel movement for a few days.  Please Note:  You might notice some irritation and congestion in your nose or some drainage.  This is from the oxygen used during your procedure.  There is no need for concern and it should clear up in a day or so.  SYMPTOMS TO REPORT IMMEDIATELY:  Following lower endoscopy (colonoscopy or flexible sigmoidoscopy):  Excessive amounts of blood in the stool  Significant tenderness or worsening of abdominal pains  Swelling of the abdomen that is new, acute  Fever of 100F or higher  For urgent or emergent issues, a gastroenterologist can be reached at any hour by calling [phone]. Do not use MyChart messaging for  urgent concerns.    DIET:  We do recommend a small meal at first, but then you may proceed to your regular diet.  Drink plenty of fluids but you should avoid alcoholic beverages for 24 hours.  ACTIVITY:  You should plan to take it easy for the rest of today and you should NOT DRIVE or use heavy machinery until tomorrow (because of the sedation medicines used during the test).    FOLLOW UP: Our staff will call the number listed on your records the next business day following your procedure.  We will call around 7:15- 8:00 am to check on you and address any questions or concerns that you may have regarding the information given to you following your procedure. If we do not reach you, we will leave a message.     If any biopsies were taken you will be contacted by phone or by letter within the next 1-3 weeks.  Please call us at [phone] if you have not heard about the biopsies in 3 weeks.    SIGNATURES/CONFIDENTIALITY: You and/or your care partner have signed paperwork which will be entered into your electronic medical record.  These signatures attest to the fact that that the information above on your After Visit Summary has been reviewed and is understood.  Full responsibility of the confidentiality of this discharge information lies with you and/or your care-partner.

## 2023-10-03 NOTE — Progress Notes (Signed)
Sedate, gd SR, tolerated procedure well, VSS, report to RN 

## 2023-10-03 NOTE — Telephone Encounter (Signed)
   Follow up Call-     10/02/2023    2:13 PM 10/02/2023    2:03 PM  Call back number  Post procedure Call Back phone  # [phone] wife   Permission to leave phone message  Yes     Patient questions:  Do you have a fever, pain , or abdominal swelling? No. Pain Score  0 *  Have you tolerated food without any problems? Yes.    Have you been able to return to your normal activities? Yes.    Do you have any questions about your discharge instructions: Diet   No. Medications  No. Follow up visit  No.  Do you have questions or concerns about your Care? No.  Actions: * If pain score is 4 or above: No action needed, pain <4.

## 2023-10-03 NOTE — Op Note (Signed)
[facility] Patient Name: George Boyer Procedure Date: 10/03/2023 10:22 AM MRN: [id number] Endoscopist: Meryl Dare , MD, [phone] Age: 72 Referring MD:  Date of Birth: 1951/05/16 Gender: Male Account #: [id number] Procedure:                Colonoscopy Indications:              Unexplained iron deficiency anemia, Multiple polyps                            on colonoscopy yesterday not removed with a poor                            prep. Medicines:                Monitored Anesthesia Care Procedure:                Pre-Anesthesia Assessment:                           - Prior to the procedure, a History and Physical                            was performed, and patient medications and                            allergies were reviewed. The patient's tolerance of                            previous anesthesia was also reviewed. The risks                            and benefits of the procedure and the sedation                            options and risks were discussed with the patient.                            All questions were answered, and informed consent                            was obtained. Prior Anticoagulants: The patient has                            taken Eliquis (apixaban), last dose was 3 days                            prior to procedure. ASA Grade Assessment: III - A                            patient with severe systemic disease. After                            reviewing the risks and benefits, the patient was  deemed in satisfactory condition to undergo the                            procedure.                           After obtaining informed consent, the colonoscope                            was passed under direct vision. Throughout the                            procedure, the patient's blood pressure, pulse, and                            oxygen saturations were monitored continuously. The                             Olympus CF-HQ190L ([id number]) Colonoscope was                            introduced through the anus and advanced to the the                            cecum, identified by appendiceal orifice and                            ileocecal valve. The ileocecal valve, appendiceal                            orifice, and rectum were photographed. The quality                            of the bowel preparation was good. The colonoscopy                            was performed without difficulty. The patient                            tolerated the procedure well. Scope In: 10:41:38 AM Scope Out: 11:15:14 AM Scope Withdrawal Time: 0 hours 29 minutes 44 seconds  Total Procedure Duration: 0 hours 33 minutes 36 seconds  Findings:                 The perianal and digital rectal examinations were                            normal.                           A 3 mm polyp was found in the cecum. The polyp was                            sessile. The polyp was removed with a cold biopsy  forceps. Resection and retrieval were complete.                           Twenty sessile polyps were found in the descending                            colon (5), transverse colon (11), hepatic flexure                            (1) and ascending colon (3). The polyps were 6 to                            12 mm in size. These polyps were removed with a                            cold snare. Resection and retrieval were complete.                           A few small-mouthed diverticula were found in the                            left colon.                           Internal hemorrhoids were found during                            retroflexion. The hemorrhoids were small and Grade                            I (internal hemorrhoids that do not prolapse).                           The exam was otherwise without abnormality on                            direct and retroflexion views. Complications:             No immediate complications. Estimated blood loss:                            None. Estimated Blood Loss:     Estimated blood loss: none. Impression:               - One 3 mm polyp in the cecum, removed with a cold                            snare. Resected and retrieved.                           - Twenty 6 to 12 mm polyps in the descending colon,                            in the transverse colon, at the hepatic flexure and  in the ascending colon, removed with a cold snare.                            Resected and retrieved.                           - Mild diverticulosis in the left colon.                           - Internal hemorrhoids.                           - The examination was otherwise normal on direct                            and retroflexion views. Recommendation:           - Repeat colonoscopy, likely 1 year, after studies                            are complete for surveillance based on pathology                            results with an extended bowel prep with Dr.                            Adela Lank.                           - Resume Eliquis (apixaban) in 3 days at prior                            dose. Refer to managing physician for further                            adjustment of therapy.                           - Patient has a contact number available for                            emergencies. The signs and symptoms of potential                            delayed complications were discussed with the                            patient. Return to normal activities tomorrow.                            Written discharge instructions were provided to the                            patient.                           -  Resume previous diet adding high fiber.                           - Continue present medications.                           - Await pathology results.                           - No aspirin, ibuprofen, naproxen, or  other                            non-steroidal anti-inflammatory drugs for 2 weeks                            after polyp removal. Meryl Dare, MD 10/03/2023 11:24:45 AM This report has been signed electronically.

## 2023-10-03 NOTE — Progress Notes (Signed)
Called to room to assist during endoscopic procedure.  [name] and intended procedure confirmed with present staff. Received instructions for my participation in the procedure from the performing physician.  

## 2023-10-03 NOTE — Progress Notes (Signed)
Seen yesterday, no changes to medical history since then  [facility] yesterday with Dr Adela Lank, colonoscopy repeat today due to poor prep

## 2023-10-04 ENCOUNTER — Telehealth: Payer: Self-pay

## 2023-10-04 NOTE — Telephone Encounter (Signed)
  Follow up Call-     10/03/2023   10:09 AM 10/02/2023    2:13 PM 10/02/2023    2:03 PM  Call back number  Post procedure Call Back phone  # [phone]- George Boyer wife [phone] wife   Permission to leave phone message Yes  Yes     Patient questions:  Do you have a fever, pain , or abdominal swelling? No. Pain Score  0 *  Have you tolerated food without any problems? Yes.    Have you been able to return to your normal activities? Yes.    Do you have any questions about your discharge instructions: Diet   No. Medications  No. Follow up visit  No.  Do you have questions or concerns about your Care? No.  Actions: * If pain score is 4 or above: No action needed, pain <4.  Talked to patients wife. She states he had some pain throughout the night, but was sleeping now. He is eating and drinking normally and back to normal activities. Will call if pain returns once he wakes up.

## 2023-10-05 ENCOUNTER — Encounter: Payer: Medicare Other | Admitting: Gastroenterology

## 2023-10-05 ENCOUNTER — Encounter: Payer: Self-pay | Admitting: Gastroenterology

## 2023-10-05 LAB — SURGICAL PATHOLOGY

## 2023-10-06 ENCOUNTER — Other Ambulatory Visit: Payer: Self-pay | Admitting: *Deleted

## 2023-10-06 DIAGNOSIS — D509 Iron deficiency anemia, unspecified: Secondary | ICD-10-CM

## 2023-10-10 ENCOUNTER — Ambulatory Visit (INDEPENDENT_AMBULATORY_CARE_PROVIDER_SITE_OTHER): Payer: Medicare Other | Admitting: Podiatry

## 2023-10-10 ENCOUNTER — Encounter: Payer: Self-pay | Admitting: Podiatry

## 2023-10-10 DIAGNOSIS — B351 Tinea unguium: Secondary | ICD-10-CM

## 2023-10-10 DIAGNOSIS — M79675 Pain in left toe(s): Secondary | ICD-10-CM | POA: Diagnosis not present

## 2023-10-10 DIAGNOSIS — E1142 Type 2 diabetes mellitus with diabetic polyneuropathy: Secondary | ICD-10-CM | POA: Diagnosis not present

## 2023-10-10 DIAGNOSIS — M79674 Pain in right toe(s): Secondary | ICD-10-CM

## 2023-10-10 DIAGNOSIS — M2012 Hallux valgus (acquired), left foot: Secondary | ICD-10-CM

## 2023-10-10 DIAGNOSIS — E119 Type 2 diabetes mellitus without complications: Secondary | ICD-10-CM | POA: Diagnosis not present

## 2023-10-10 DIAGNOSIS — M2011 Hallux valgus (acquired), right foot: Secondary | ICD-10-CM | POA: Diagnosis not present

## 2023-10-10 DIAGNOSIS — L84 Corns and callosities: Secondary | ICD-10-CM

## 2023-10-10 NOTE — Progress Notes (Signed)
ANNUAL DIABETIC FOOT EXAM  Subjective: George Boyer presents today for annual diabetic foot exam.  Chief Complaint  Patient presents with   Diabetes    [facility] BS - 100 A1C - 7.SOMETHING [facility]P - 09/11/2023   Callouses    RIGHT    Patient confirms h/o diabetes since age 72.  Patient denies any h/o foot wounds.  He is accompanied by his wife on today's visit. Wife states George Boyer has had some health challenges and has now retired.  George Givens, PA-C is patient's PCP.  Past Medical History:  Diagnosis Date   Anxiety    Asthma    Atrial fibrillation (HCC)    Cellulitis of right hand 06/04/2020   CHF (congestive heart failure) (HCC)    Chronic kidney disease (CKD)    Colon polyps    Coronary artery disease    Diabetes mellitus without complication (HCC)    Diastolic heart failure (HCC)    Gallstones    HLD (hyperlipidemia)    Hypertension    IDA (iron deficiency anemia)    Obesity    Pneumonia    Sleep apnea treated with continuous positive airway pressure (CPAP)    Patient Active Problem List   Diagnosis Date Noted   Acute on chronic diastolic (congestive) heart failure (HCC) 09/01/2023   Mild cognitive impairment 06/13/2023   Bilateral carotid artery stenosis 01/31/2022   Aortic valve stenosis 01/27/2021   Erythema of hand 05/15/2020   Diastolic dysfunction 04/08/2020   Anemia due to chronic kidney disease 07/11/2019   CKD stage 3 due to type 2 diabetes mellitus (HCC) 07/11/2019   Microalbuminuria 07/11/2019   SOB (shortness of breath) on exertion 04/19/2017   S/P coronary artery stent placement 05/04/2016   Obstructive sleep apnea 09/29/2015   High risk medication use 08/21/2014   Body mass index 40.0-44.9, adult (HCC) 08/14/2013   Anxiety and depression 08/15/2012   Arthralgia 08/15/2012   Bradycardia 08/15/2012   CAD in native artery 08/15/2012   Central obesity 08/15/2012   Dependent edema 08/15/2012   Dyslipidemia 08/15/2012   Erectile  dysfunction 08/15/2012   HTN (hypertension) 08/15/2012   T2DM (type 2 diabetes mellitus) (HCC) 08/15/2012   Vitamin D deficiency 08/15/2012   Hyperlipidemia LDL goal <100 08/15/2012   Unspecified atrial fibrillation (HCC) 07/13/2012   Long term (current) use of anticoagulants 07/13/2012   Past Surgical History:  Procedure Laterality Date   COLONOSCOPY     CORONARY STENT PLACEMENT     TONSILLECTOMY     UPPER GASTROINTESTINAL ENDOSCOPY     Current Outpatient Medications on File Prior to Visit  Medication Sig Dispense Refill   ACCU-CHEK AVIVA PLUS test strip USE TO TEST BLOOD SUGARS FOUR TIMES DAILY DX E11.9 AND Z79.4  1   Accu-Chek FastClix Lancets MISC Use to monitor blood glucose 4 time(s) daily DX CODE E11.9 and Z79.4     amiodarone (PACERONE) 200 MG tablet Take 200 mg by mouth daily.     apixaban (ELIQUIS) 5 MG TABS tablet Take 5 mg by mouth 2 (two) times daily.     B-D UF III MINI PEN NEEDLES 31G X 5 MM MISC SMARTSIG:Injection Daily     cholecalciferol (VITAMIN D) 1000 UNITS tablet Take 2,000 Units by mouth daily.     dapagliflozin propanediol (FARXIGA) 10 MG TABS tablet Take 10 mg by mouth daily.     donepezil (ARICEPT) 10 MG tablet Take 10 mg by mouth at bedtime.     furosemide (LASIX) 40 MG  tablet Take 40 mg by mouth daily.     Insulin Syringe-Needle U-100 (INSULIN SYRINGE 1CC/31GX5/16") 31G X 5/16" 1 ML MISC Use with insulin five (5) times daily  DX E11.42 and Z79.4     iron polysaccharides (NIFEREX) 150 MG capsule Take 1 capsule (150 mg total) by mouth daily. 30 capsule 0   nitroGLYCERIN (NITROSTAT) 0.4 MG SL tablet Place 0.4 mg under the tongue every 5 (five) minutes as needed for chest pain. (Patient not taking: Reported on 09/06/2023)     omeprazole (PRILOSEC) 20 MG capsule Take 1 capsule (20 mg total) by mouth daily. 30 capsule 1   pravastatin (PRAVACHOL) 20 MG tablet Take 20 mg by mouth daily.  0   ranolazine (RANEXA) 500 MG 12 hr tablet Take 500 mg by mouth 2 (two) times  daily.  11   TOUJEO SOLOSTAR 300 UNIT/ML Solostar Pen Inject 25 Units into the skin in the morning and at bedtime.     TRADJENTA 5 MG TABS tablet      traZODone (DESYREL) 100 MG tablet Take 100 mg by mouth at bedtime.     No current facility-administered medications on file prior to visit.    Allergies  Allergen Reactions   Sunflower Oil Anaphylaxis    Swelling of throat Swelling of throat Swelling of throat-any sunflower product Swelling of throat-any sunflower product    Bupropion Other (See Comments)    Tremors   Glucophage [Metformin Hcl] Diarrhea   Ozempic (0.25 Or 0.5 Mg-Dose) [Semaglutide(0.25 Or 0.5mg -Dos)]    Semaglutide Other (See Comments)   Social History   Occupational History   Occupation: Audiological scientist  Tobacco Use   Smoking status: Never   Smokeless tobacco: Never  Vaping Use   Vaping status: Never Used  Substance and Sexual Activity   Alcohol use: No   Drug use: No   Sexual activity: Not Currently   Family History  Problem Relation Age of Onset   Pancreatic cancer Mother    Heart attack Father    Heart disease Brother    Colon cancer Neg Hx    Colon polyps Neg Hx    Esophageal cancer Neg Hx    Rectal cancer Neg Hx    Stomach cancer Neg Hx    Immunization History  Administered Date(s) Administered   Influenza,inj,quad, With Preservative 08/17/2018   PFIZER(Purple Top)SARS-COV-2 Vaccination 12/10/2019, 12/31/2019   Pneumococcal Polysaccharide-23 07/09/2019     Review of Systems: Negative except as noted in the HPI.   Objective: There were no vitals filed for this visit.  George Boyer is a pleasant 72 y.o. male in NAD. AAO X 3.  Title   Diabetic Foot Exam - detailed Date & Time: 10/10/2023  8:45 AM Diabetic Foot exam was performed with the following findings: Yes  Visual Foot Exam completed.: Yes  Is there a history of foot ulcer?: No Is there a foot ulcer now?: No Is there swelling?: No Is there elevated skin temperature?: No Is there  abnormal foot shape?: No Is there a claw toe deformity?: No Are the toenails long?: Yes Are the toenails thick?: Yes Are the toenails ingrown?: No Is the skin thin, fragile, shiny and hairless?": No Normal Range of Motion?: Yes Is there foot or ankle muscle weakness?: No Do you have pain in calf while walking?: No Are the shoes appropriate in style and fit?: Yes Can the patient see the bottom of their feet?: No Pulse Foot Exam completed.: Yes   Right Posterior Tibialis: Absent Left  posterior Tibialis: Absent   Right Dorsalis Pedis: Present Left Dorsalis Pedis: Present     Sensory Foot Exam Completed.: Yes Semmes-Weinstein Monofilament Test "+" means "has sensation" and "-" means "no sensation"  R Foot Test Control: Pos L Foot Test Control: Pos   R Site 1-Great Toe: Pos L Site 1-Great Toe: Pos   R Site 4: Pos L Site 4: Pos   R site 5: Pos L Site 5: Pos  R Site 6: Pos L Site 6: Pos     Image components are not supported.   Image components are not supported. Image components are not supported.  [place] Right vibratory: absent Left vibratory: absent  Comments Hyperkeratotic lesion(s) submet head 1 right foot.  No erythema, no edema, no drainage, no fluctuance.   Equinus b/l LE. Limited joint ROM to the 1st MPJ right foot. HAV with bunion deformity noted b/l LE. Utilizes rollator for ambulation assistance.     ADA Risk Categorization: Low Risk :  Patient has all of the following: Intact protective sensation No prior foot ulcer  No severe deformity Pedal pulses present  Assessment: 1. Pain due to onychomycosis of toenails of both feet   2. Callus   3. Hallux valgus, acquired, bilateral   4. Diabetic peripheral neuropathy associated with type 2 diabetes mellitus (HCC)   5. Encounter for diabetic foot exam [facility])     Plan: -Patient's family member present. All questions/concerns addressed on today's visit. -Diabetic foot examination performed today. -Continue  diabetic foot care principles: inspect feet daily, monitor glucose as recommended by PCP and/or Endocrinologist, and follow prescribed diet per PCP, Endocrinologist and/or dietician. -Patient to continue soft, supportive shoe gear daily. -Toenails 1-5 b/l were debrided in length and girth with sterile nail nippers and dremel without iatrogenic bleeding.  -Callus(es) submet head 1 right foot pared utilizing sterile scalpel blade without complication or incident. Total number debrided =1. -Patient/POA to call should there be question/concern in the interim. Return in about 3 months (around 01/10/2024).  Freddie Breech, DPM      [place] LOCATION: [address], [place] [address]                   Office [phone]   [facility] LOCATION: [address] [place], [place] [address] Office [phone]

## 2023-11-23 ENCOUNTER — Other Ambulatory Visit: Payer: Medicare Other

## 2023-11-23 DIAGNOSIS — D509 Iron deficiency anemia, unspecified: Secondary | ICD-10-CM

## 2023-11-23 LAB — CBC WITH DIFFERENTIAL/PLATELET
Basophils Absolute: 0 10*3/uL (ref 0.0–0.1)
Basophils Relative: 0.6 % (ref 0.0–3.0)
Eosinophils Absolute: 0 10*3/uL (ref 0.0–0.7)
Eosinophils Relative: 0.7 % (ref 0.0–5.0)
HCT: 33 % — ABNORMAL LOW (ref 39.0–52.0)
Hemoglobin: 10.7 g/dL — ABNORMAL LOW (ref 13.0–17.0)
Lymphocytes Relative: 27.7 % (ref 12.0–46.0)
Lymphs Abs: 1.7 10*3/uL (ref 0.7–4.0)
MCHC: 32.4 g/dL (ref 30.0–36.0)
MCV: 84.5 fL (ref 78.0–100.0)
Monocytes Absolute: 0.7 10*3/uL (ref 0.1–1.0)
Monocytes Relative: 11.8 % (ref 3.0–12.0)
Neutro Abs: 3.7 10*3/uL (ref 1.4–7.7)
Neutrophils Relative %: 59.2 % (ref 43.0–77.0)
Platelets: 309 10*3/uL (ref 150.0–400.0)
RBC: 3.91 Mil/uL — ABNORMAL LOW (ref 4.22–5.81)
RDW: 18.8 % — ABNORMAL HIGH (ref 11.5–15.5)
WBC: 6.3 10*3/uL (ref 4.0–10.5)

## 2023-11-23 LAB — IBC + FERRITIN
Ferritin: 15.1 ng/mL — ABNORMAL LOW (ref 22.0–322.0)
Iron: 41 ug/dL — ABNORMAL LOW (ref 42–165)
Saturation Ratios: 12.3 % — ABNORMAL LOW (ref 20.0–50.0)
TIBC: 333.2 ug/dL (ref 250.0–450.0)
Transferrin: 238 mg/dL (ref 212.0–360.0)

## 2023-11-24 ENCOUNTER — Other Ambulatory Visit: Payer: Self-pay

## 2023-11-24 ENCOUNTER — Encounter: Payer: Self-pay | Admitting: Gastroenterology

## 2023-11-24 DIAGNOSIS — D509 Iron deficiency anemia, unspecified: Secondary | ICD-10-CM | POA: Insufficient documentation

## 2023-11-24 DIAGNOSIS — Z7901 Long term (current) use of anticoagulants: Secondary | ICD-10-CM

## 2023-11-27 ENCOUNTER — Telehealth: Payer: Self-pay

## 2023-11-27 NOTE — Telephone Encounter (Signed)
 IV iron (Feraheme) scheduled for 12/04/23 and 12/11/2023 at 2:30 pm.

## 2023-11-27 NOTE — Telephone Encounter (Signed)
 Dr. Leigh and Gamaliel, patient will be scheduled as soon as possible.  Auth Submission: NO AUTH NEEDED Site of care: Site of care: [facility] Payer: [employer] medicare Medication & CPT/J Code(s) submitted: Feraheme (ferumoxytol ) [phone] Route of submission (phone, fax, portal): portal Phone # Fax # Auth type: Buy/Bill PB Units/visits requested: 510mg  x 2 doses Reference number: [id number] Approval from: 11/27/23 to 05/20/24

## 2023-11-27 NOTE — Telephone Encounter (Signed)
 Thank you :)

## 2023-11-30 ENCOUNTER — Emergency Department (HOSPITAL_BASED_OUTPATIENT_CLINIC_OR_DEPARTMENT_OTHER)
Admission: EM | Admit: 2023-11-30 | Discharge: 2023-11-30 | Disposition: A | Discharge status: Home / Self Care | Payer: Medicare Other | Attending: Emergency Medicine | Admitting: Emergency Medicine

## 2023-11-30 ENCOUNTER — Emergency Department (HOSPITAL_BASED_OUTPATIENT_CLINIC_OR_DEPARTMENT_OTHER): Payer: Medicare Other

## 2023-11-30 ENCOUNTER — Other Ambulatory Visit: Payer: Self-pay

## 2023-11-30 ENCOUNTER — Encounter (HOSPITAL_BASED_OUTPATIENT_CLINIC_OR_DEPARTMENT_OTHER): Payer: Self-pay | Admitting: Emergency Medicine

## 2023-11-30 DIAGNOSIS — S8992XA Unspecified injury of left lower leg, initial encounter: Secondary | ICD-10-CM | POA: Insufficient documentation

## 2023-11-30 DIAGNOSIS — Z7901 Long term (current) use of anticoagulants: Secondary | ICD-10-CM | POA: Diagnosis not present

## 2023-11-30 DIAGNOSIS — D631 Anemia in chronic kidney disease: Secondary | ICD-10-CM | POA: Insufficient documentation

## 2023-11-30 DIAGNOSIS — E1122 Type 2 diabetes mellitus with diabetic chronic kidney disease: Secondary | ICD-10-CM | POA: Insufficient documentation

## 2023-11-30 DIAGNOSIS — I2510 Atherosclerotic heart disease of native coronary artery without angina pectoris: Secondary | ICD-10-CM | POA: Diagnosis not present

## 2023-11-30 DIAGNOSIS — W1839XA Other fall on same level, initial encounter: Secondary | ICD-10-CM | POA: Diagnosis not present

## 2023-11-30 DIAGNOSIS — I130 Hypertensive heart and chronic kidney disease with heart failure and stage 1 through stage 4 chronic kidney disease, or unspecified chronic kidney disease: Secondary | ICD-10-CM | POA: Diagnosis not present

## 2023-11-30 DIAGNOSIS — J45909 Unspecified asthma, uncomplicated: Secondary | ICD-10-CM | POA: Diagnosis not present

## 2023-11-30 DIAGNOSIS — I5030 Unspecified diastolic (congestive) heart failure: Secondary | ICD-10-CM | POA: Diagnosis not present

## 2023-11-30 DIAGNOSIS — Z794 Long term (current) use of insulin: Secondary | ICD-10-CM | POA: Insufficient documentation

## 2023-11-30 DIAGNOSIS — N189 Chronic kidney disease, unspecified: Secondary | ICD-10-CM | POA: Diagnosis not present

## 2023-11-30 DIAGNOSIS — W19XXXA Unspecified fall, initial encounter: Secondary | ICD-10-CM

## 2023-11-30 NOTE — Discharge Instructions (Signed)
 You were seen in the ER after a fall.  The x-rays of your pelvis and your knee did not show any broken or dislocated bones. I suspect you likely sprained your knee when you fell. We have placed it in an immobilizer.   I recommend using ice for swelling, and taking tylenol  as needed for pain.  If your knee is no better in about a week, I recommend following up with the orthopedist. I have attached their contact information for you to make an appointment if needed.   Continue to monitor how you're doing and return to the ER for new or worsening symptoms.

## 2023-11-30 NOTE — ED Provider Notes (Signed)
 [place] EMERGENCY DEPARTMENT AT [facility] Provider Note   CSN: [id number] Arrival date & time: 11/30/23  1511     History  No chief complaint on file.   George Boyer is a 73 y.o. male with history of CAD, diabetes, hypertension, atrial fibrillation on Eliquis , asthma, iron  deficiency anemia, diastolic heart failure, chronic kidney disease, hyperlipidemia, OSA on CPAP, who presents to the ER after a fall.  It is reported that patient's been undergoing test for generalized body tremors, with no formal diagnosis made yet.  He ambulates with a walker at baseline.  States he started having tremors, and it caused him to fall backwards.  He fell onto his bottom on the floor, but that is not draining him.  He feels his left knee twisted and he heard a crack.  He is only complaining of pain in his left knee at this time.  No head trauma or loss of consciousness.    HPI     Home Medications Prior to Admission medications   Medication Sig Start Date End Date Taking? Authorizing Provider  ACCU-CHEK AVIVA PLUS test strip USE TO TEST BLOOD SUGARS FOUR TIMES DAILY DX E11.9 AND Z79.4 09/03/18   [provider]  Accu-Chek FastClix Lancets MISC Use to monitor blood glucose 4 time(s) daily DX CODE E11.9 and Z79.4 02/08/19   [provider]  amiodarone  (PACERONE ) 200 MG tablet Take 200 mg by mouth daily.    [provider]  apixaban  (ELIQUIS ) 5 MG TABS tablet Take 5 mg by mouth 2 (two) times daily.    [provider]  B-D UF III MINI PEN NEEDLES 31G X 5 MM MISC SMARTSIG:Injection Daily 09/29/23   [provider]  cholecalciferol (VITAMIN D ) 1000 UNITS tablet Take 2,000 Units by mouth daily.    [provider]  dapagliflozin propanediol (FARXIGA) 10 MG TABS tablet Take 10 mg by mouth daily.    [provider]  donepezil  (ARICEPT ) 10 MG tablet Take 10 mg by mouth at bedtime.    [provider]  furosemide  (LASIX ) 40 MG  tablet Take 40 mg by mouth daily.    [provider]  Insulin  Syringe-Needle U-100 (INSULIN  SYRINGE 1CC/31GX5/16) 31G X 5/16 1 ML MISC Use with insulin  five (5) times daily  DX E11.42 and Z79.4 02/08/19   [provider]  iron  polysaccharides (NIFEREX) 150 MG capsule Take 1 capsule (150 mg total) by mouth daily. 09/04/23   Fairy Frames, MD  nitroGLYCERIN  (NITROSTAT ) 0.4 MG SL tablet Place 0.4 mg under the tongue every 5 (five) minutes as needed for chest pain. Patient not taking: Reported on 09/06/2023 08/05/13   [provider]  omeprazole  (PRILOSEC) 20 MG capsule Take 1 capsule (20 mg total) by mouth daily. 09/06/23   Armbruster, Elspeth SQUIBB, MD  pravastatin  (PRAVACHOL ) 20 MG tablet Take 20 mg by mouth daily. 09/07/18   [provider]  ranolazine  (RANEXA ) 500 MG 12 hr tablet Take 500 mg by mouth 2 (two) times daily. 09/13/18   [provider]  TOUJEO  SOLOSTAR 300 UNIT/ML Solostar Pen Inject 25 Units into the skin in the morning and at bedtime. 09/04/23   Fairy Frames, MD  TRADJENTA  5 MG TABS tablet  08/01/19   [provider]  traZODone (DESYREL) 100 MG tablet Take 100 mg by mouth at bedtime.    [provider]      Allergies    Sunflower oil, Bupropion , Glucophage [metformin hcl], Ozempic (0.25 or 0.5 mg-dose) [semaglutide(0.25  or 0.5mg -dos)], and Semaglutide    Review of Systems   Review of Systems  Musculoskeletal:  Positive for arthralgias.  All other systems reviewed and are negative.   Physical Exam Updated Vital Signs BP (!) 139/51   Pulse (!) 43   Temp 97.8 F (36.6 C) (Oral)   Resp 12   SpO2 100%  Physical Exam Vitals and nursing note reviewed.  Constitutional:      Appearance: Normal appearance.  HENT:     Head: Normocephalic and atraumatic.  Eyes:     Conjunctiva/sclera: Conjunctivae normal.  Cardiovascular:     Rate and Rhythm: Normal rate and regular rhythm.     Pulses:          Posterior tibial  pulses are 1+ on the right side and 1+ on the left side.  Pulmonary:     Effort: Pulmonary effort is normal. No respiratory distress.     Breath sounds: Normal breath sounds.  Chest:     Comments: Chest wall stable Abdominal:     General: There is no distension.     Palpations: Abdomen is soft.     Tenderness: There is no abdominal tenderness.  Musculoskeletal:     Comments: Pelvis stable.  Left knee with generalized swelling, no gross deformity on exam.  Compartments of the lower leg are soft.  Mild sensation deficit on the left leg (chronic neuropathy).  Strong distal pulses.  Skin:    General: Skin is warm and dry.  Neurological:     General: No focal deficit present.     Mental Status: He is alert.     ED Results / Procedures / Treatments   Labs (all labs ordered are listed, but only abnormal results are displayed) Labs Reviewed - No data to display  EKG None  Radiology DG Pelvis Portable Result Date: 11/30/2023 CLINICAL DATA:  Pain after fall EXAM: PORTABLE PELVIS 1 VIEWS COMPARISON:  None Available. FINDINGS: Osteopenia. Hyperostosis. Mild joint space loss of the hips and sacroiliac joints. No fracture or dislocation. Vascular calcifications are seen diffusely. With this level of osteopenia subtle nondisplaced injury is difficult to completely exclude and if needed additional workup with CT as clinically appropriate or MRI. Overlapping cardiac leads. IMPRESSION: Osteopenia with degenerative changes. Electronically Signed   By: Ranell Bring M.D.   On: 11/30/2023 16:40   DG Knee Complete 4 Views Left Result Date: 11/30/2023 CLINICAL DATA:  Left knee injury.  Fall. EXAM: LEFT KNEE - COMPLETE 4 VIEW COMPARISON:  Left knee radiographs 12/19/2021. FINDINGS: Mild degenerative changes have progressed in the patellofemoral and lateral components of the knee. No acute fractures are present. A moderate joint effusion is present. Vascular calcifications are noted. IMPRESSION: 1. Moderate  joint effusion without acute fracture. 2. Mild degenerative changes in the patellofemoral and lateral components of the knee. Electronically Signed   By: Lonni Necessary M.D.   On: 11/30/2023 16:00    Procedures Procedures    Medications Ordered in ED Medications - No data to display  ED Course/ Medical Decision Making/ A&P                                 Medical Decision Making This patient is a 73 y.o. male  who presents to the ED for concern of fall with L knee injury.  No head trauma.  Differential diagnoses prior to evaluation: The emergent differential diagnosis includes, but is not limited to,  fracture, dislocation, ligamentous injury, internal bleeding.. This is not an exhaustive differential.   Past Medical History / Co-morbidities / Social History: CAD, diabetes, hypertension, atrial fibrillation on Eliquis , asthma, iron  deficiency anemia, diastolic heart failure, chronic kidney disease, hyperlipidemia, OSA on CPAP  Physical Exam: Physical exam performed. The pertinent findings include: Bradycardic, appears consistent compared to prior.  Chest and pelvis stable.  Left knee with generalized swelling, no gross deformity.  Neurovascularly intact in the bilateral lower extremities.  Lab Tests/Imaging studies: I personally interpreted labs/imaging and the pertinent results include:  XR of left knee and pelvis without acute bony abnormalities. I agree with the radiologist interpretation.  Disposition: After consideration of the diagnostic results and the patients response to treatment, I feel that emergency department workup does not suggest an emergent condition requiring admission or immediate intervention beyond what has been performed at this time. The plan is: discharge to home. Sustained likely sprain of the knee. Given knee immobilizer, recommend OTC meds and RICE method. Recommend ortho follow up if no improvement. The patient is safe for discharge and has been  instructed to return immediately for worsening symptoms, change in symptoms or any other concerns.  Final Clinical Impression(s) / ED Diagnoses Final diagnoses:  Fall, initial encounter  Injury of left knee, initial encounter    Rx / DC Orders ED Discharge Orders     None      Portions of this report may have been transcribed using voice recognition software. Every effort was made to ensure accuracy; however, inadvertent computerized transcription errors may be present.    Azucena Dart T, PA-C 11/30/23 1652    Mannie Pac T, DO 11/30/23 2252

## 2023-11-30 NOTE — ED Triage Notes (Signed)
 Pt has been undergoing tests for body tremors,no dx as of yet. He ambulates with walker. He started having tremors today and he fell backwards, hitting his bottom on the floor. His knees were slightly bent and only complains of left knee pain. Is on blood thinner for afib.

## 2023-11-30 NOTE — ED Notes (Signed)
 RN reviewed discharge instructions with pt. Pt verbalized understanding and had no further questions. VSS upon discharge.

## 2023-12-04 ENCOUNTER — Ambulatory Visit: Payer: Medicare Other

## 2023-12-11 ENCOUNTER — Ambulatory Visit: Payer: Medicare Other

## 2023-12-14 ENCOUNTER — Ambulatory Visit (INDEPENDENT_AMBULATORY_CARE_PROVIDER_SITE_OTHER): Payer: Medicare Other | Admitting: Family

## 2023-12-14 ENCOUNTER — Encounter: Payer: Self-pay | Admitting: Family

## 2023-12-14 VITALS — BP 122/60 | HR 60 | Temp 97.0°F | Resp 17 | Ht 67.0 in | Wt 203.4 lb

## 2023-12-14 DIAGNOSIS — R2681 Unsteadiness on feet: Secondary | ICD-10-CM

## 2023-12-14 DIAGNOSIS — G8929 Other chronic pain: Secondary | ICD-10-CM

## 2023-12-14 DIAGNOSIS — E559 Vitamin D deficiency, unspecified: Secondary | ICD-10-CM

## 2023-12-14 DIAGNOSIS — I10 Essential (primary) hypertension: Secondary | ICD-10-CM | POA: Diagnosis not present

## 2023-12-14 DIAGNOSIS — E1142 Type 2 diabetes mellitus with diabetic polyneuropathy: Secondary | ICD-10-CM | POA: Diagnosis not present

## 2023-12-14 DIAGNOSIS — E1122 Type 2 diabetes mellitus with diabetic chronic kidney disease: Secondary | ICD-10-CM

## 2023-12-14 DIAGNOSIS — D509 Iron deficiency anemia, unspecified: Secondary | ICD-10-CM

## 2023-12-14 DIAGNOSIS — G4733 Obstructive sleep apnea (adult) (pediatric): Secondary | ICD-10-CM

## 2023-12-14 DIAGNOSIS — I5032 Chronic diastolic (congestive) heart failure: Secondary | ICD-10-CM

## 2023-12-14 DIAGNOSIS — R296 Repeated falls: Secondary | ICD-10-CM

## 2023-12-14 DIAGNOSIS — E785 Hyperlipidemia, unspecified: Secondary | ICD-10-CM

## 2023-12-14 DIAGNOSIS — N183 Chronic kidney disease, stage 3 unspecified: Secondary | ICD-10-CM

## 2023-12-14 DIAGNOSIS — I2510 Atherosclerotic heart disease of native coronary artery without angina pectoris: Secondary | ICD-10-CM

## 2023-12-14 DIAGNOSIS — M25562 Pain in left knee: Secondary | ICD-10-CM

## 2023-12-14 DIAGNOSIS — Z7689 Persons encountering health services in other specified circumstances: Secondary | ICD-10-CM

## 2023-12-14 DIAGNOSIS — G3184 Mild cognitive impairment, so stated: Secondary | ICD-10-CM

## 2023-12-14 DIAGNOSIS — Z1159 Encounter for screening for other viral diseases: Secondary | ICD-10-CM

## 2023-12-14 MED ORDER — NITROGLYCERIN 0.4 MG SL SUBL: 0.4000 mg | SUBLINGUAL_TABLET | SUBLINGUAL | 1 refills | Status: AC | PRN

## 2023-12-14 MED ORDER — TRADJENTA 5 MG PO TABS: 5.0000 mg | ORAL_TABLET | Freq: Every day | ORAL | 3 refills | Status: DC

## 2023-12-14 NOTE — Patient Instructions (Signed)
Please get Shingles,Tdap and COVID-19 vaccine at the pharmacy

## 2023-12-14 NOTE — Progress Notes (Signed)
Provider: Richarda Blade FNP-C   Irisa Grimsley, Donalee Citrin, NP  Patient Care Team: Baldo Hufnagle, Donalee Citrin, NP as PCP - General (Family Medicine) Freddie Breech, DPM as Consulting Physician (Podiatry)  Extended Emergency Contact Information Primary Emergency Contact: [facility] Address: [address] [place], [place] [address] D[place] of [place] Home Phone: [phone] Mobile Phone: [phone] Relation: Spouse  Code Status:  Full Code  Goals of care: Advanced Directive information    12/14/2023    1:12 PM  Advanced Directives  Does Patient Have a Medical Advance Directive? Yes  Type of Estate agent of [place];Living will  Does patient want to make changes to medical advance directive? No - Patient declined  Copy of Healthcare Power of Attorney in Chart? No - copy requested     Chief Complaint  Patient presents with   Establish Care    New patient appointment    Discussed the use of AI scribe software for clinical note transcription with the patient, who gave verbal consent to proceed.  HPI:  Pt is a 73 y.o. male seen today to establish care for medical management of chronic diseases.The patient, with a complex medical history including hypertension, high cholesterol, atrial fibrillation, diastolic heart failure, chronic kidney disease (stage 3B), and type 2 diabetes for 47 years, presented with a significant history of falls, 15 times since October of the previous year. The patient was hospitalized in October for fluid overload. The patient reported shortness of breath the day before the consultation but denied any significant swelling. The patient had lost a significant amount of weight, from 219 pounds in November to 203.4 pounds at the time of the consultation. The patient's blood sugars had been running high, in the 300s and high 200s.  The patient reported a decrease in appetite, eating smaller portions than usual. The patient also reported  episodes of shaking in the legs and arms, leading to falls. 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 The patient was also diagnosed with dementia in March 2023 and was on Aricept. The patient reported feeling cold frequently, with a thyroid test showing borderline low results.  The patient had a history of multiple colonoscopies, with 20 pre-cancerous polyps removed. The patient is scheduled for four MRIs on February 5th, including the head, neck, thoracic spine, and lumbar spine. The patient reported occasional chest pain, diagnosed as angina, occurring approximately once a week. The patient was also on CPAP for obstructive sleep apnea.  The patient reported occasional difficulty swallowing, with an episode of regurgitation after drinking water. The patient denied any nausea, vomiting, or diarrhea but reported constipation managed with Senokot. The patient reported a decrease in sleep, waking up early in the morning around 3-4 AM and feeling anxious. The patient was on trazodone and melatonin for sleep.  The patient had a history of a stent placement more than 21 years ago and a tonsillectomy in 1968. The patient reported one fall where he hit his head on the nightstand, causing prolonged pain in the right forehead. The patient reported no changes in vision and had not been wearing glasses. The patient reported occasional difficulty swallowing and a constant cough, attributed to congestive heart failure.  The patient reported pain in the left knee, with an x-ray showing fluid in the knee in the past. The patient declined the orthopedic specialist's recommendation to drain the fluid. The patient reported occasional lower back pain. The patient  reported a skin issue on the left side of the chest, which improved with Eucerin or CeraVe application. The patient reported a history of nosebleeds. The patient is due for a tetanus vaccine and a shingles vaccine. The  patient had not received the latest COVID vaccine. The patient denied any smoking history or current alcohol use.       Past Medical History:  Diagnosis Date   Anxiety    Asthma    Atrial fibrillation (HCC)    Cellulitis of right hand 06/04/2020   CHF (congestive heart failure) (HCC)    Chronic kidney disease (CKD)    Colon polyps    Coronary artery disease    Diabetes mellitus without complication (HCC)    Diastolic heart failure (HCC)    Gallstones    HLD (hyperlipidemia)    Hypertension    IDA (iron deficiency anemia)    Obesity    Pneumonia    Sleep apnea treated with continuous positive airway pressure (CPAP)    Past Surgical History:  Procedure Laterality Date   COLONOSCOPY     CORONARY STENT PLACEMENT     TONSILLECTOMY     UPPER GASTROINTESTINAL ENDOSCOPY      Allergies  Allergen Reactions   Sunflower Oil Anaphylaxis    Swelling of throat Swelling of throat Swelling of throat-any sunflower product Swelling of throat-any sunflower product    Bupropion Other (See Comments)    Tremors   Glucophage [Metformin Hcl] Diarrhea   Ozempic (0.25 Or 0.5 Mg-Dose) [Semaglutide(0.25 Or 0.5mg -Dos)]    Semaglutide Other (See Comments)    Allergies as of 12/14/2023       Reactions   Sunflower Oil Anaphylaxis   Swelling of throat Swelling of throat Swelling of throat-any sunflower product Swelling of throat-any sunflower product   Bupropion Other (See Comments)   Tremors   Glucophage [metformin Hcl] Diarrhea   Ozempic (0.25 Or 0.5 Mg-dose) [semaglutide(0.25 Or 0.5mg -dos)]    Semaglutide Other (See Comments)        Medication List        Accurate as of December 14, 2023  1:18 PM. If you have any questions, ask your nurse or doctor.          STOP taking these medications    iron polysaccharides 150 MG capsule Commonly known as: NIFEREX Stopped by: Donalee Citrin Chae Shuster   omeprazole 20 MG capsule Commonly known as: PRILOSEC Stopped by: Syble Picco C Dyamon Sosinski        TAKE these medications    Accu-Chek Aviva Plus test strip Generic drug: glucose blood USE TO TEST BLOOD SUGARS FOUR TIMES DAILY DX E11.9 AND Z79.4   Accu-Chek FastClix Lancets Misc Use to monitor blood glucose 4 time(s) daily DX CODE E11.9 and Z79.4   amiodarone 200 MG tablet Commonly known as: PACERONE Take 200 mg by mouth daily.   B-D UF III MINI PEN NEEDLES 31G X 5 MM Misc Generic drug: Insulin Pen Needle SMARTSIG:Injection Daily   cholecalciferol 1000 units tablet Commonly known as: VITAMIN D Take 2,000 Units by mouth once a week.   donepezil 10 MG tablet Commonly known as: ARICEPT Take 10 mg by mouth at bedtime.   Eliquis 5 MG Tabs tablet Generic drug: apixaban Take 5 mg by mouth 2 (two) times daily.   Farxiga 5 MG Tabs tablet Generic drug: dapagliflozin propanediol Take 5 mg by mouth daily. What changed: Another medication with the same name was removed. Continue taking this medication, and follow the directions you see here.  Changed by: Donalee Citrin Alexismarie Flaim   furosemide 40 MG tablet Commonly known as: LASIX Take 40 mg by mouth daily.   INSULIN SYRINGE 1CC/31GX5/16" 31G X 5/16" 1 ML Misc Use with insulin five (5) times daily  DX E11.42 and Z79.4   Melatonin 10 MG Tabs Take 1 tablet by mouth at bedtime.   nitroGLYCERIN 0.4 MG SL tablet Commonly known as: NITROSTAT Place 0.4 mg under the tongue every 5 (five) minutes as needed for chest pain.   pravastatin 20 MG tablet Commonly known as: PRAVACHOL Take 20 mg by mouth daily.   ranolazine 500 MG 12 hr tablet Commonly known as: RANEXA Take 500 mg by mouth 2 (two) times daily.   senna 8.6 MG tablet Commonly known as: SENOKOT Take 1 tablet by mouth daily.   Toujeo SoloStar 300 UNIT/ML Solostar Pen Generic drug: insulin glargine (1 Unit Dial) Inject 25 Units into the skin in the morning and at bedtime.   Tradjenta 5 MG Tabs tablet Generic drug: linagliptin Take 5 mg by mouth daily.   traZODone 100 MG  tablet Commonly known as: DESYREL Take 100 mg by mouth at bedtime.        Review of Systems  Constitutional:  Negative for appetite change, chills, fatigue, fever and unexpected weight change.  HENT:  Positive for hearing loss and rhinorrhea. Negative for congestion, dental problem, ear discharge, ear pain, facial swelling, nosebleeds, postnasal drip, sinus pressure, sinus pain, sneezing, sore throat, tinnitus and trouble swallowing.   Eyes:  Positive for visual disturbance. Negative for pain, discharge, redness and itching.       Wears eye glasses follows up with ophthalmology   Respiratory:  Negative for cough, chest tightness, shortness of breath and wheezing.   Cardiovascular:  Negative for chest pain, palpitations and leg swelling.       Agina at least once a week   Gastrointestinal:  Positive for constipation. Negative for abdominal distention, abdominal pain, blood in stool, diarrhea, nausea and vomiting.       Senokot effective   Endocrine: Negative for cold intolerance, heat intolerance, polydipsia, polyphagia and polyuria.  Genitourinary:  Negative for difficulty urinating, dysuria, flank pain, frequency and urgency.  Musculoskeletal:  Positive for arthralgias, back pain and gait problem. Negative for joint swelling, myalgias, neck pain and neck stiffness.       Left knee pain wears brace but does not wear. Had effusion but declined in the past but willing for referral.   Skin:  Negative for color change, pallor, rash and wound.  Neurological:  Negative for dizziness, syncope, speech difficulty, weakness, light-headedness, numbness and headaches.  Hematological:  Does not bruise/bleed easily.  Psychiatric/Behavioral:  Positive for sleep disturbance. Negative for agitation, behavioral problems, confusion, hallucinations, self-injury and suicidal ideas. The patient is not nervous/anxious.     Immunization History  Administered Date(s) Administered   Fluad Quad(high Dose 65+)  08/13/2020   Fluad Trivalent(High Dose 65+) 09/11/2023   Influenza Inj Mdck Quad Pf 10/05/2021   Influenza,inj,quad, With Preservative 08/17/2018   Influenza-Unspecified 08/17/2018, 08/17/2018, 08/01/2019, 08/01/2019   PFIZER(Purple Top)SARS-COV-2 Vaccination 12/10/2019, 12/31/2019   PNEUMOCOCCAL CONJUGATE-20 10/05/2021   Pneumococcal Polysaccharide-23 07/09/2019   Pneumococcal-Unspecified 07/09/2019   Pertinent  Health Maintenance Due  Topic Date Due   HEMOGLOBIN A1C  Never done   OPHTHALMOLOGY EXAM  Never done   Colonoscopy  10/02/2024   FOOT EXAM  10/09/2024   INFLUENZA VACCINE  Completed      06/18/2022   10:38 PM 12/14/2023  1:12 PM  Fall Risk  Falls in the past year?  1  Was there an injury with Fall?  1  Fall Risk Category Calculator  3  (RETIRED) Patient Fall Risk Level Low fall risk   Patient at Risk for Falls Due to  History of fall(s);Impaired balance/gait;Impaired mobility  Fall risk Follow up  Falls evaluation completed;Education provided;Falls prevention discussed   Functional Status Survey:    Vitals:   12/14/23 1258  BP: 122/60  Pulse: 60  Resp: 17  Temp: (!) 97 F (36.1 C)  SpO2: 98%  Weight: 203 lb 6.4 oz (92.3 kg)  Height: 5\' 7"  (1.702 m)   Body mass index is 31.86 kg/m. Physical Exam Vitals reviewed.  Constitutional:      General: He is not in acute distress.    Appearance: Normal appearance. He is obese. He is not ill-appearing or diaphoretic.  HENT:     Head: Normocephalic.     Right Ear: Tympanic membrane, ear canal and external ear normal. There is no impacted cerumen.     Left Ear: Tympanic membrane, ear canal and external ear normal. There is no impacted cerumen.     Nose: Nose normal. No congestion or rhinorrhea.     Mouth/Throat:     Mouth: Mucous membranes are moist.     Pharynx: Oropharynx is clear. No oropharyngeal exudate or posterior oropharyngeal erythema.  Eyes:     General: No scleral icterus.       Right eye: No  discharge.        Left eye: No discharge.     Extraocular Movements: Extraocular movements intact.     Conjunctiva/sclera: Conjunctivae normal.     Pupils: Pupils are equal, round, and reactive to light.  Neck:     Vascular: No carotid bruit.  Cardiovascular:     Rate and Rhythm: Normal rate and regular rhythm.     Pulses: Normal pulses.     Heart sounds: Normal heart sounds. No murmur heard.    No friction rub. No gallop.  Pulmonary:     Effort: Pulmonary effort is normal. No respiratory distress.     Breath sounds: Normal breath sounds. No wheezing, rhonchi or rales.  Chest:     Chest wall: No tenderness.  Abdominal:     General: Bowel sounds are normal. There is no distension.     Palpations: Abdomen is soft. There is no mass.     Tenderness: There is no abdominal tenderness. There is no right CVA tenderness, left CVA tenderness, guarding or rebound.  Musculoskeletal:        General: No swelling. Normal range of motion.     Cervical back: Normal range of motion. No rigidity or tenderness.     Right knee: Normal.     Left knee: No swelling, erythema or ecchymosis. Normal range of motion. Tenderness present over the lateral joint line. Normal pulse.     Right lower leg: No edema.     Left lower leg: No edema.  Lymphadenopathy:     Cervical: No cervical adenopathy.  Skin:    General: Skin is warm and dry.     Coloration: Skin is not pale.     Findings: No bruising, erythema, lesion or rash.  Neurological:     Mental Status: He is alert and oriented to person, place, and time.     Cranial Nerves: No cranial nerve deficit.     Sensory: No sensory deficit.     Motor: No weakness.  Coordination: Coordination normal.     Gait: Gait abnormal.  Psychiatric:        Mood and Affect: Mood normal.        Speech: Speech normal.        Behavior: Behavior normal.        Thought Content: Thought content normal.        Cognition and Memory: He exhibits impaired recent memory.         Judgment: Judgment normal.     Labs reviewed: Recent Labs    09/01/23 1223 09/02/23 0314 09/03/23 0356 09/04/23 0333  NA  --  139 138 140  K  --  4.7 4.2 3.8  CL  --  107 102 103  CO2  --  25 25 26   GLUCOSE  --  98 72 97  BUN  --  38* 34* 32*  CREATININE  --  2.55* 2.38* 2.21*  CALCIUM  --  8.7* 9.1 9.0  MG 2.3 2.3  --   --    Recent Labs    09/01/23 1143 09/02/23 0314 09/03/23 0356  AST 20 12* 16  ALT 14 17 19   ALKPHOS 68 63 71  BILITOT 0.7 0.8 1.0  PROT 7.1 6.1* 7.1  ALBUMIN 3.4* 2.7* 3.2*   Recent Labs    09/04/23 0333 09/13/23 1241 11/23/23 1117  WBC 6.6 7.3 6.3  NEUTROABS  --  3.5 3.7  HGB 10.3* 10.5* 10.7*  HCT 32.2* 33.5* 33.0*  MCV 82.1 84.6 84.5  PLT 280 309.0 309.0   Lab Results  Component Value Date   TSH 1.218 09/01/2023   No results found for: "HGBA1C" No results found for: "CHOL", "HDL", "LDLCALC", "LDLDIRECT", "TRIG", "CHOLHDL"  Significant Diagnostic Results in last 30 days:  DG Pelvis Portable Result Date: 11/30/2023 CLINICAL DATA:  Pain after fall EXAM: PORTABLE PELVIS 1 VIEWS COMPARISON:  None Available. FINDINGS: Osteopenia. Hyperostosis. Mild joint space loss of the hips and sacroiliac joints. No fracture or dislocation. Vascular calcifications are seen diffusely. With this level of osteopenia subtle nondisplaced injury is difficult to completely exclude and if needed additional workup with CT as clinically appropriate or MRI. Overlapping cardiac leads. IMPRESSION: Osteopenia with degenerative changes. Electronically Signed   By: Karen Kays M.D.   On: 11/30/2023 16:40   DG Knee Complete 4 Views Left Result Date: 11/30/2023 CLINICAL DATA:  Left knee injury.  Fall. EXAM: LEFT KNEE - COMPLETE 4 VIEW COMPARISON:  Left knee radiographs 12/19/2021. FINDINGS: Mild degenerative changes have progressed in the patellofemoral and lateral components of the knee. No acute fractures are present. A moderate joint effusion is present. Vascular  calcifications are noted. IMPRESSION: 1. Moderate joint effusion without acute fracture. 2. Mild degenerative changes in the patellofemoral and lateral components of the knee. Electronically Signed   By: Marin Roberts M.D.   On: 11/30/2023 16:00    Assessment/Plan  Heart Failure with Preserved Ejection Fraction (HFpEF) Diastolic heart failure with recent hospitalization for fluid overload. No current edema, but recent dyspnea. Significant weight loss from 242 lbs in October to 2023 .4 lbs today. - Requires Hospital bed to elevate head of the bed to alleviate shortness of breath and elevated lower extremities.   - Monitor weight daily - Continue current medications - Order CBC, CMP, and potassium levels - For home use only DME Hospital bed  2. Type 2 Diabetes Mellitus Diabetes for 47 years. Blood sugars in the 300s, with some readings up to 373. A1c expected to be over  8. Advised dietary changes to manage blood sugar levels. - Check A1c with lab work - Encourage dietary changes such as switching from cereal to oatmeal with blueberries - Continue Tradjenta 5 mg daily and Toujeo 25 units twice a day - continue to follow up with Ophthalmology will need to obtain records from recent eye exam. No retinopathy  - continue to follow up with podiatry for diabetic foot exam  - Hemoglobin A1c - Microalbumin / creatinine urine ratio  3. Chronic Kidney Disease Stage 3B Chronic kidney disease stage 3B. Advised to avoid NSAIDs to prevent further kidney damage. - Continue to avoid NSAIDs - Follow up with nephrologist on February 10 at [facility]  4. Atrial Fibrillation Atrial fibrillation. Currently on Eliquis 5 mg twice daily and amiodarone 200 mg daily. no signs of bleeding  - Continue current medications  5. Iron Deficiency Anemia Iron deficiency anemia with recent hospitalization for iron and blood transfusions. Missed scheduled iron infusions in January due to poor health.  Advised to reschedule iron infusions and monitor hemoglobin levels. - Reschedule iron infusions - Monitor hemoglobin with CBC  6. Hypertension Hypertension. Blood pressure was 105/3 at the neurologist's office. Does not regularly check blood pressure at home. - Encourage regular blood pressure monitoring at home - TSH - COMPLETE METABOLIC PANEL WITH GFR - CBC with Differential/Platelet - TRADJENTA 5 MG TABS tablet; Take 1 tablet (5 mg total) by mouth daily.  Dispense: 30 tablet; Refill: 3  7. Dementia Diagnosed with dementia in March 2023. Currently on Aricept (donepezil) 10 mg daily at bedtime. Reports increased forgetfulness and changes in daily habits. - Continue Aricept 10 mg daily  8. 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 Experiencing frequent falls and shaking episodes. Awaiting results of MRIs scheduled for February 5 to further evaluate neurological status. - Await results of MRIs scheduled for February 5 - Consider physical therapy referral  9. Obstructive Sleep Apnea Obstructive sleep apnea. Uses CPAP. Scheduled for a sleep study to reassess CPAP settings due to significant weight loss. - Follow up on sleep study results  10.Constipation Reports of constipation. Currently taking Senokot. Advised to maintain adequate fluid intake to manage symptoms. - Continue Senokot - Encourage adequate fluid intake  11. Insomnia Reports difficulty sleeping through the night. Currently taking melatonin 10 mg and trazodone 100 mg at bedtime. Advised on sleep hygiene practices to improve sleep quality. - Continue melatonin and trazodone - Encourage sleep hygiene practices such as avoiding electronics before bed and listening to instrumental music  12. Hyperlipidemia LDL goal <100 - Lipid panel Continue with dietary modification and exercise as tolerated   13. Vitamin D deficiency - Continue on vit D supplement  - Vitamin D, 1,25-dihydroxy  14. CAD in native artery -  nitroGLYCERIN (NITROSTAT) 0.4 MG SL tablet; Place 1 tablet (0.4 mg total) under the tongue every 5 (five) minutes as needed for chest pain.  Dispense: 5 tablet; Refill: 1  15 .Chronic pain of left knee Left knee swelling without any erythema tender on bilateral lateral sides - continue OTC analgesic  - Ambulatory referral to Orthopedic Surgery  General Health Maintenance Due for tetanus and shingles vaccines. Last diabetic eye exam was in summer 2024. Regular follow-ups with podiatrist. - Get tetanus and shingles vaccines at the pharmacy - Request records from eye exam to check for diabetic retinopathy - Continue regular podiatrist visits  Follow-up - Follow up in 4 months - Order hospital bed for easier mobility - Ensure referral to orthopedic specialist for knee  evaluation - Screen for hepatitis C with lab work.  Family/ staff Communication: Reviewed plan of care with patient and wife verbalized understanding   Labs/tests ordered:  - Hep C Antibody - TSH - COMPLETE METABOLIC PANEL WITH GFR - CBC with Differential/Platelet - Hemoglobin A1c - Microalbumin / creatinine urine ratio  Next Appointment : Return in about 4 months (around 04/12/2024) for medical mangement of chronic issues.Caesar Bookman, NP

## 2023-12-15 LAB — MICROALBUMIN / CREATININE URINE RATIO
Creatinine, Urine: 47 mg/dL (ref 20–320)
Microalb Creat Ratio: 9 mg/g{creat} (ref <30)
Microalb, Ur: 0.4 mg/dL

## 2023-12-20 LAB — CBC WITH DIFFERENTIAL/PLATELET
Absolute Lymphocytes: 2442 {cells}/uL (ref 850–3900)
Absolute Monocytes: 895 {cells}/uL (ref 200–950)
Basophils Absolute: 59 {cells}/uL (ref 0–200)
Basophils Relative: 0.8 %
Eosinophils Absolute: 118 {cells}/uL (ref 15–500)
Eosinophils Relative: 1.6 %
HCT: 33.5 % — ABNORMAL LOW (ref 38.5–50.0)
Hemoglobin: 10.7 g/dL — ABNORMAL LOW (ref 13.2–17.1)
MCH: 28.1 pg (ref 27.0–33.0)
MCHC: 31.9 g/dL — ABNORMAL LOW (ref 32.0–36.0)
MCV: 87.9 fL (ref 80.0–100.0)
MPV: 10.9 fL (ref 7.5–12.5)
Monocytes Relative: 12.1 %
Neutro Abs: 3885 {cells}/uL (ref 1500–7800)
Neutrophils Relative %: 52.5 %
Platelets: 349 10*3/uL (ref 140–400)
RBC: 3.81 10*6/uL — ABNORMAL LOW (ref 4.20–5.80)
RDW: 15.5 % — ABNORMAL HIGH (ref 11.0–15.0)
Total Lymphocyte: 33 %
WBC: 7.4 10*3/uL (ref 3.8–10.8)

## 2023-12-20 LAB — VITAMIN D 1,25 DIHYDROXY
Vitamin D 1, 25 (OH)2 Total: <8 pg/mL — ABNORMAL LOW (ref 18–72)
Vitamin D2 1, 25 (OH)2: <8 pg/mL
Vitamin D3 1, 25 (OH)2: <8 pg/mL

## 2023-12-20 LAB — COMPLETE METABOLIC PANEL WITH GFR
AG Ratio: 0.9 (calc) — ABNORMAL LOW (ref 1.0–2.5)
ALT: 13 U/L (ref 9–46)
AST: 12 U/L (ref 10–35)
Albumin: 3.2 g/dL — ABNORMAL LOW (ref 3.6–5.1)
Alkaline phosphatase (APISO): 75 U/L (ref 35–144)
BUN/Creatinine Ratio: 13 (calc) (ref 6–22)
BUN: 27 mg/dL — ABNORMAL HIGH (ref 7–25)
CO2: 29 mmol/L (ref 20–32)
Calcium: 9.1 mg/dL (ref 8.6–10.3)
Chloride: 102 mmol/L (ref 98–110)
Creat: 2.14 mg/dL — ABNORMAL HIGH (ref 0.70–1.28)
Globulin: 3.4 g/dL (ref 1.9–3.7)
Glucose, Bld: 195 mg/dL — ABNORMAL HIGH (ref 65–139)
Potassium: 4.4 mmol/L (ref 3.5–5.3)
Sodium: 139 mmol/L (ref 135–146)
Total Bilirubin: 0.7 mg/dL (ref 0.2–1.2)
Total Protein: 6.6 g/dL (ref 6.1–8.1)
eGFR: 31 mL/min/{1.73_m2} — ABNORMAL LOW (ref >=60)

## 2023-12-20 LAB — TSH: TSH: 0.76 m[IU]/L (ref 0.40–4.50)

## 2023-12-20 LAB — LIPID PANEL
Cholesterol: 185 mg/dL (ref <200)
HDL: 52 mg/dL (ref >=40)
LDL Cholesterol (Calc): 110 mg/dL — ABNORMAL HIGH
Non-HDL Cholesterol (Calc): 133 mg/dL — ABNORMAL HIGH (ref <130)
Total CHOL/HDL Ratio: 3.6 (calc) (ref <5.0)
Triglycerides: 121 mg/dL (ref <150)

## 2023-12-20 LAB — HEMOGLOBIN A1C
Hgb A1c MFr Bld: 8.9 %{Hb} — ABNORMAL HIGH (ref <5.7)
Mean Plasma Glucose: 209 mg/dL
eAG (mmol/L): 11.6 mmol/L

## 2023-12-20 LAB — HEPATITIS C ANTIBODY: Hepatitis C Ab: NONREACTIVE

## 2023-12-21 ENCOUNTER — Ambulatory Visit (HOSPITAL_BASED_OUTPATIENT_CLINIC_OR_DEPARTMENT_OTHER): Payer: Medicare Other | Admitting: Orthopaedic Surgery

## 2023-12-21 DIAGNOSIS — M1712 Unilateral primary osteoarthritis, left knee: Secondary | ICD-10-CM

## 2023-12-21 DIAGNOSIS — M25562 Pain in left knee: Secondary | ICD-10-CM

## 2023-12-21 MED ORDER — LIDOCAINE HCL 1 % IJ SOLN
4.0000 mL | INTRAMUSCULAR | Status: AC | PRN
  Administered 2023-12-21: 4 mL

## 2023-12-21 MED ORDER — TRIAMCINOLONE ACETONIDE 40 MG/ML IJ SUSP
80.0000 mg | INTRAMUSCULAR | Status: AC | PRN
  Administered 2023-12-21: 80 mg via INTRA_ARTICULAR

## 2023-12-21 NOTE — Progress Notes (Signed)
Chief Complaint: Left knee pain     History of Present Illness:    LAFE CLERK is a 73 y.o. male who presents with ongoing left knee pain in the setting of previous multiple falls over the course of the last year.  He states that after his last fall 2 months prior has had significant pain and difficulty putting weight on the left knee.  He has been using a walker for assisted ambulation.  He does have a history of left knee aspiration although no previous steroid injections.  He is here today with his wife.     PMH/PSH/Family History/Social History/Meds/Allergies:    Past Medical History:  Diagnosis Date  . Anxiety   . Asthma   . Atrial fibrillation (HCC)   . Cellulitis of right hand 06/04/2020  . CHF (congestive heart failure) (HCC)   . Chronic kidney disease (CKD)   . Colon polyps   . Coronary artery disease   . Diabetes mellitus without complication (HCC)   . Diastolic heart failure (HCC)   . Gallstones   . HLD (hyperlipidemia)   . Hypertension   . IDA (iron deficiency anemia)   . Obesity   . Pneumonia   . Sleep apnea treated with continuous positive airway pressure (CPAP)    Past Surgical History:  Procedure Laterality Date  . COLONOSCOPY    . CORONARY STENT PLACEMENT    . TONSILLECTOMY    . UPPER GASTROINTESTINAL ENDOSCOPY     Social History   Socioeconomic History  . Marital status: Married    Spouse name: Not on file  . Number of children: 2  . Years of education: Not on file  . Highest education level: Not on file  Occupational History  . Occupation: accounting  Tobacco Use  . Smoking status: Never  . Smokeless tobacco: Never  Vaping Use  . Vaping status: Never Used  Substance and Sexual Activity  . Alcohol use: No  . Drug use: No  . Sexual activity: Not Currently  Other Topics Concern  . Not on file  Social History Narrative  . Not on file   Social Drivers of Health   Financial Resource Strain: Low Risk  (03/12/2023)   Received from  [facility], [facility]   Overall Financial Resource Strain (CARDIA)   . Difficulty of Paying Living Expenses: Not hard at all  Food Insecurity: No Food Insecurity (09/01/2023)   Hunger Vital Sign   . Worried About Programme researcher, broadcasting/film/video in the Last Year: Never true   . Ran Out of Food in the Last Year: Never true  Transportation Needs: No Transportation Needs (09/01/2023)   PRAPARE - Transportation   . Lack of Transportation (Medical): No   . Lack of Transportation (Non-Medical): No  Physical Activity: Unknown (03/12/2023)   Received from [facility], [facility]   Exercise Vital Sign   . Days of Exercise per Week: 0 days   . Minutes of Exercise per Session: Not on file  Stress: No Stress Concern Present (03/12/2023)   Received from [facility] Occupational [facility] - Occupational Stress Questionnaire   . Feeling of Stress : Only a little  Recent Concern: Stress - Stress Concern Present (12/19/2022)   Received from [facility] of Occupational Health - Occupational Stress Questionnaire   . Feeling of Stress : To some extent  Social Connections: Moderately Integrated (03/12/2023)   [facility]   Social Network   . How would you rate your social network (family, work, friends)?: Adequate participation with social networks   Family History  Problem Relation Age of Onset  . Pancreatic cancer Mother   . Heart attack Father   . Heart disease Brother   . Colon cancer Neg Hx   . Colon polyps Neg Hx   . Esophageal cancer Neg Hx   . Rectal cancer Neg Hx   . Stomach cancer Neg Hx    Allergies  Allergen Reactions  . Sunflower Oil Anaphylaxis    Swelling of throat Swelling of throat Swelling of throat-any sunflower product Swelling of throat-any sunflower product   . Bupropion Other (See Comments)    Tremors  . Glucophage [Metformin Hcl] Diarrhea  . Ozempic (0.25 Or 0.5 Mg-Dose)  [Semaglutide(0.25 Or 0.5mg -Dos)]   . Semaglutide Other (See Comments)   Current Outpatient Medications  Medication Sig Dispense Refill  . ACCU-CHEK AVIVA PLUS test strip USE TO TEST BLOOD SUGARS FOUR TIMES DAILY DX E11.9 AND Z79.4  1  . Accu-Chek FastClix Lancets MISC Use to monitor blood glucose 4 time(s) daily DX CODE E11.9 and Z79.4    . amiodarone (PACERONE) 200 MG tablet Take 200 mg by mouth daily.    Marland Kitchen apixaban (ELIQUIS) 5 MG TABS tablet Take 5 mg by mouth 2 (two) times daily.    . B-D UF III MINI PEN NEEDLES 31G X 5 MM MISC SMARTSIG:Injection Daily    . cholecalciferol (VITAMIN D) 1000 UNITS tablet Take 2,000 Units by mouth once a week.    . dapagliflozin propanediol (FARXIGA) 5 MG TABS tablet Take 5 mg by mouth daily.    Marland Kitchen donepezil (ARICEPT) 10 MG tablet Take 10 mg by mouth at bedtime.    . furosemide (LASIX) 40 MG tablet Take 40 mg by mouth daily.    . Insulin Syringe-Needle U-100 (INSULIN SYRINGE 1CC/31GX5/16") 31G X 5/16" 1 ML MISC Use with insulin five (5) times daily  DX E11.42 and Z79.4    . Melatonin 10 MG TABS Take 1 tablet by mouth at bedtime.    . nitroGLYCERIN (NITROSTAT) 0.4 MG SL tablet Place 1 tablet (0.4 mg total) under the tongue every 5 (five) minutes as needed for chest pain. 5 tablet 1  . pravastatin (PRAVACHOL) 20 MG tablet Take 20 mg by mouth daily.  0  . ranolazine (RANEXA) 500 MG 12 hr tablet Take 500 mg by mouth 2 (two) times daily.  11  . senna (SENOKOT) 8.6 MG tablet Take 1 tablet by mouth daily.    Nathen May SOLOSTAR 300 UNIT/ML Solostar Pen Inject 25 Units into the skin in the morning and at bedtime.    . TRADJENTA 5 MG TABS tablet Take 1 tablet (5 mg total) by mouth daily. 30 tablet 3  . traZODone (DESYREL) 100 MG tablet Take 100 mg by mouth at bedtime.     No current facility-administered medications for this visit.   No results found.  Review of Systems:   A ROS was performed including pertinent positives and negatives as documented in the  HPI.  Physical Exam :   Constitutional: NAD and appears stated age Neurological: Alert and oriented Psych: Appropriate affect and cooperative There were no vitals taken for this visit.   Comprehensive Musculoskeletal Exam:    Left knee with tenderness palpation about the lateral as well as medial joint line.  There is positive crepitus with range of motion from 0 to 130 degrees.  Distal neurosensory exam is intact.   Imaging:   Xray (4 views left knee, AP pelvis): Advanced left knee osteoarthritis     I personally reviewed and interpreted the radiographs.   Assessment and Plan:   73 y.o. male with left knee advanced osteoarthritis in the setting multiple falls.  This time I do believe his falls and progression have flared up the left knee.  With regard to that I have recommended an ultrasound-guided injection of the left knee.  He would like to proceed with this today after verbal consent was obtained.  I did also counsel in the possibility of aquatic therapy to assist with ambulation and gait and he will consider this further  -Left knee ultrasound-guided steroid injection provided verbal consent obtained    Procedure Note  Patient: George Boyer             Date of Birth: [birth year]           MRN: [id number]             Visit Date: 12/21/2023  Procedures: Visit Diagnoses: No diagnosis found.  Large Joint Inj: L knee on 12/21/2023 11:47 AM Indications: pain Details: 22 G 1.5 in needle, ultrasound-guided anterior approach  Arthrogram: No  Medications: 4 mL lidocaine 1 %; 80 mg triamcinolone acetonide 40 MG/ML Outcome: tolerated well, no immediate complications Procedure, treatment alternatives, risks and benefits explained, specific risks discussed. Consent was given by the patient. Immediately prior to procedure a time out was called to verify the correct patient, procedure, equipment, support staff and site/side marked as required. Patient was prepped and draped in the  usual sterile fashion.        I personally saw and evaluated the patient, and participated in the management and treatment plan.  Huel Cote, MD Attending Physician, Orthopedic Surgery  This document was dictated using Dragon voice recognition software. A reasonable attempt at proof reading has been made to minimize errors.

## 2023-12-26 NOTE — Telephone Encounter (Signed)
Was order for patient hospital bed placed through [facility]? Message routed to PCP [name], Donalee Citrin, NP and Medical Assistant assigned to provider during that day.

## 2023-12-28 ENCOUNTER — Other Ambulatory Visit: Payer: Self-pay

## 2023-12-28 DIAGNOSIS — E785 Hyperlipidemia, unspecified: Secondary | ICD-10-CM

## 2023-12-28 MED ORDER — TOUJEO SOLOSTAR 300 UNIT/ML ~~LOC~~ SOPN: 25.0000 [IU] | PEN_INJECTOR | Freq: Two times a day (BID) | SUBCUTANEOUS | 1 refills | Status: DC

## 2023-12-28 MED ORDER — VITAMIN D (ERGOCALCIFEROL) 1.25 MG (50000 UNIT) PO CAPS: ORAL_CAPSULE | ORAL | 1 refills | Status: DC

## 2023-12-28 MED ORDER — PRAVASTATIN SODIUM 20 MG PO TABS: 20.0000 mg | ORAL_TABLET | Freq: Every day | ORAL | 0 refills | Status: DC

## 2023-12-28 NOTE — Telephone Encounter (Signed)
 High Risk Warning Populated when attempting to refill, I will send to Provider for further review

## 2024-01-03 ENCOUNTER — Encounter: Payer: Self-pay | Admitting: Neurology

## 2024-01-15 ENCOUNTER — Ambulatory Visit (INDEPENDENT_AMBULATORY_CARE_PROVIDER_SITE_OTHER): Payer: Medicare Other | Admitting: Podiatry

## 2024-01-15 ENCOUNTER — Encounter: Payer: Self-pay | Admitting: Podiatry

## 2024-01-15 VITALS — Ht 67.0 in | Wt 203.0 lb

## 2024-01-15 DIAGNOSIS — L84 Corns and callosities: Secondary | ICD-10-CM | POA: Diagnosis not present

## 2024-01-15 DIAGNOSIS — M79675 Pain in left toe(s): Secondary | ICD-10-CM

## 2024-01-15 DIAGNOSIS — M79674 Pain in right toe(s): Secondary | ICD-10-CM

## 2024-01-15 DIAGNOSIS — B351 Tinea unguium: Secondary | ICD-10-CM | POA: Diagnosis not present

## 2024-01-15 DIAGNOSIS — E1151 Type 2 diabetes mellitus with diabetic peripheral angiopathy without gangrene: Secondary | ICD-10-CM

## 2024-01-15 NOTE — Progress Notes (Signed)
 Subjective:  [name]: George Boyer, male    DOB: 1951/03/11,  MRN: [id number]  George Boyer presents to clinic today for:  Chief Complaint  Patient presents with   DFc    He is here for diabetic nail trim and last A1C was 8.9.   Patient notes nails are thick and elongated, causing pain in shoe gear when ambulating.  Patient also has a painful callus right submet 1.  A male companion is with her today.  His last A1c in January 2025 was 8.9.  He has been having multiple falls in the past 4 months and is getting referred to a motion disorder neurologist soon for evaluation.    PCP is [name], [name], NP. last seen on 12/14/23.  Past Medical History:  Diagnosis Date   Anxiety    Asthma    Atrial fibrillation (HCC)    Cellulitis of right hand 06/04/2020   CHF (congestive heart failure) (HCC)    Chronic kidney disease (CKD)    Colon polyps    Coronary artery disease    Diabetes mellitus without complication (HCC)    Diastolic heart failure (HCC)    Gallstones    HLD (hyperlipidemia)    Hypertension    IDA (iron deficiency anemia)    Obesity    Pneumonia    Sleep apnea treated with continuous positive airway pressure (CPAP)     Allergies  Allergen Reactions   Sunflower Oil Anaphylaxis    Swelling of throat Swelling of throat Swelling of throat-any sunflower product Swelling of throat-any sunflower product    Bupropion Other (See Comments)    Tremors   Glucophage [Metformin Hcl] Diarrhea   Ozempic (0.25 Or 0.5 Mg-Dose) [Semaglutide(0.25 Or 0.5mg -Dos)]    Semaglutide Other (See Comments)    Review of Systems: Negative except as noted in the HPI.  Objective:  George Boyer is a pleasant 73 y.o. male in NAD. AAO x 3.  Vascular Examination: Patient has palpable DP pulse, absent PT pulse bilateral.  Delayed capillary refill bilateral toes.  Sparse digital hair bilateral.  Proximal to distal cooling WNL bilateral.    Dermatological Examination: Interspaces  are clear with no open lesions noted bilateral.  Skin is shiny and atrophic bilateral.  Nails are 3-28mm thick, with yellowish/brown discoloration, subungual debris and distal onycholysis x10.  There is pain with compression of nails x10.  There are hyperkeratotic lesions noted right submet 1 .  Patient qualifies for at-risk foot care because of diabetes with PVD.  Assessment/Plan: 1. Pain due to onychomycosis of toenails of both feet   2. Callus   3. Type II diabetes mellitus with peripheral circulatory disorder (HCC)     Mycotic nails x10 were sharply debrided with sterile nail nippers and power debriding burr to decrease bulk and length.  Hyperkeratotic lesion submet 1 right foot was shaved with #312 blade.  At the end of the patient's appointment he felt his great toe nails should be cut shorter.  His companion stated he is very particular about his feet.  Informed patient that the toenails were trimmed short enough , and debriding any further  would put him at risk for getting cut and bleeding.  This is actually what we want to avoid in a diabetic patient with uncontrolled blood sugar.  He can opt to follow-up with another podiatrist our office if he wants this to be treated more like a pedicure and all the care performed to to  his specifications.  Informed him that the nail was adequately trimmed in a safe manner today.  F/u 3 months.   Clerance Lav, DPM, FACFAS [facility]     [address] [place], [place] [address]                Office [phone]  Fax [phone]

## 2024-01-15 NOTE — Progress Notes (Unsigned)
 Assessment/Plan:    ***2.  Severe cervical central canal stenosis  -Unfortunately, I do not have any of his films, only reports.  Subjective:   George Boyer was seen today in the movement disorders clinic for neurologic consultation at the request of [name], MD.  The consultation is for the evaluation of abnormal involuntary movements.  Patient medical records are reviewed.  Patient has been seen by [facility] neurology since March, 2023.  He was originally being seen for memory change and MCI.  However, abnormal movements, weakness, falls seemed to develop after hospital stay in October.  The hospital stay was only a 3-day hospital stay for severe iron deficiency anemia and acute on chronic congestive heart failure.  Patient was transfused during that hospitalization because of anemia.  He was started on oral iron and told to follow-up with outpatient gastroenterology, which he did.  He saw outpatient neurology at [facility] December 5 with complaints of weakness and falls.  They were concerned that he was having episodes of decreased responsiveness and "full body tremors" and wondered if he was having seizures.  EEG was done and was slow (6 Hz) but was otherwise unremarkable, without spells during the EEG.  He then followed up, this time with Dr. Terrial Rhodes, and had numerous lab tests.  A summary of lab test and diagnostic imaging from his note is as follows:  MRI brain wo (12/2023) - Moderate cerebral and hippocampal atrophy unchanged compared with February 18, 2022. No acute abnormality. MRI brain wo (01/2022) - Moderate-to-severe cerebral atrophy including in the medial temporal lobe hippocampi MRI c-spine wo (12/2023) - Cervical spondylosis which has progressed compared with the prior study from 2014. 1) Central disc herniation at C4-5 causing severe spinal stenosis and slight indention of the cord. 2) Left paracentral disc herniation at C5-6 which effaces the thecal sac on the left and indents  the left side of the cord. MRI t-spine wo (12/2023) - Multilevel thoracic degenerative disc disease with small disc herniations, without cord compression. Normal thoracic cord. MRI l-spine wo (12/2023) - Mild degenerative changes. No spinal stenosis. CT head - No acute intracranial abnormality. Atrophy. Bilateral basal ganglia calcification  EMG - n/a EEG (10/2023) - Abnormal EEG recording due to diffuse background slowing which is non specific in nature and is suggestive of encephalopathy due to Toxic-metabolic, Hypoxic-Ischemic, Neurodegenerative or post ictal causes. Clinical correlation is advised.  Labs - ammonia normal, heavy metals negative, PTH normal, AchR antibody negative, MUSK antibody negative, sed rate normal, CRP normal, ANA negative, CK normal, vitamin B12 normal, TSH normal, A1C 8.9, vitamin D < 8   Dr. Terrial Rhodes noted that patient did have cervical central canal stenosis, but did not think that that would cause the patient's symptoms.  He also noted that he had bilateral basal ganglia calcification, but did not think that his symptoms would come on so acutely.  He wondered if perhaps there was a functional component to the symptoms.  Neurocognitive testing was going to be ordered and he was sent here for another opinion.  Specific Symptoms:  Tremor: {yes no:314532} Family hx of similar:  {yes no:314532} Voice: *** Sleep: ***  Vivid Dreams:  {yes no:314532}  Acting out dreams:  {yes no:314532} Wet Pillows: {yes no:314532} Postural symptoms:  {yes no:314532}  Falls?  Yes.  ,  Was in the emergency room January 9 after a fall. Bradykinesia symptoms: {parkinson brady:18041} Loss of smell:  {yes no:314532} Loss of taste:  {yes no:314532} Urinary Incontinence:  {  yes no:314532} Difficulty Swallowing:  {yes no:314532} Handwriting, micrographia: {yes no:314532} Trouble with ADL's:  {yes no:314532}  Trouble buttoning clothing: {yes no:314532} Depression:  {yes no:314532} Memory  changes:  {yes no:314532} Hallucinations:  {yes no:314532}  visual distortions: {yes no:314532} N/V:  {yes no:314532} Lightheaded:  {yes no:314532}  Syncope: {yes no:314532} Diplopia:  {yes no:314532} Dyskinesia:  {yes no:314532} Prior exposure to reglan/antipsychotics: {yes no:314532}  Neuroimaging of the brain has *** previously been performed.  It *** available for my review today.  PREVIOUS MEDICATIONS: {Parkinson's RX:18200}  ALLERGIES:   Allergies  Allergen Reactions   Sunflower Oil Anaphylaxis    Swelling of throat Swelling of throat Swelling of throat-any sunflower product Swelling of throat-any sunflower product    Bupropion Other (See Comments)    Tremors   Glucophage [Metformin Hcl] Diarrhea   Ozempic (0.25 Or 0.5 Mg-Dose) [Semaglutide(0.25 Or 0.5mg -Dos)]    Semaglutide Other (See Comments)    CURRENT MEDICATIONS:  No outpatient medications have been marked as taking for the 01/17/24 encounter (Appointment) with Abdi Husak, Octaviano Batty, DO.     Objective:   VITALS:  There were no vitals filed for this visit.  GEN:  The patient appears stated age and is in NAD. HEENT:  Normocephalic, atraumatic.  The mucous membranes are moist. The superficial temporal arteries are without ropiness or tenderness. CV:  RRR Lungs:  CTAB Neck/HEME:  There are no carotid bruits bilaterally.  Neurological examination:  Orientation: The patient is alert and oriented x3.  Cranial nerves: There is good facial symmetry. Extraocular muscles are intact. The visual fields are full to confrontational testing. The speech is fluent and clear. Soft palate rises symmetrically and there is no tongue deviation. Hearing is intact to conversational tone. Sensation: Sensation is intact to light and pinprick throughout (facial, trunk, extremities). Vibration is intact at the bilateral big toe. There is no extinction with double simultaneous stimulation. There is no sensory dermatomal level  identified. Motor: Strength is 5/5 in the bilateral upper and lower extremities.   Shoulder shrug is equal and symmetric.  There is no pronator drift. Deep tendon reflexes: Deep tendon reflexes are 2/4 at the bilateral biceps, triceps, brachioradialis, patella and achilles. Plantar responses are downgoing bilaterally.  Movement examination: Tone: There is ***tone in the bilateral upper extremities.  The tone in the lower extremities is ***.  Abnormal movements: *** Coordination:  There is *** decremation with RAM's, *** Gait and Station: The patient has *** difficulty arising out of a deep-seated chair without the use of the hands. The patient's stride length is ***.  The patient has a *** pull test.     I have reviewed and interpreted the following labs independently   Chemistry      Component Value Date/Time   NA 139 12/14/2023 1425   K 4.4 12/14/2023 1425   CL 102 12/14/2023 1425   CO2 29 12/14/2023 1425   BUN 27 (H) 12/14/2023 1425   CREATININE 2.14 (H) 12/14/2023 1425      Component Value Date/Time   CALCIUM 9.1 12/14/2023 1425   ALKPHOS 71 09/03/2023 0356   AST 12 12/14/2023 1425   ALT 13 12/14/2023 1425   BILITOT 0.7 12/14/2023 1425      Lab Results  Component Value Date   TSH 0.76 12/14/2023   Lab Results  Component Value Date   WBC 7.4 12/14/2023   HGB 10.7 (L) 12/14/2023   HCT 33.5 (L) 12/14/2023   MCV 87.9 12/14/2023   PLT 349 12/14/2023  Total time spent on today's visit was ***60 minutes, including both face-to-face time and nonface-to-face time.  Time included that spent on review of records (prior notes available to me/labs/imaging if pertinent), discussing treatment and goals, answering patient's questions and coordinating care.  Cc:  [name], Donalee Citrin, NP

## 2024-01-16 ENCOUNTER — Ambulatory Visit (INDEPENDENT_AMBULATORY_CARE_PROVIDER_SITE_OTHER): Payer: Medicare Other

## 2024-01-16 VITALS — BP 152/80 | HR 49 | Temp 97.7°F | Resp 20 | Ht 68.0 in | Wt 199.2 lb

## 2024-01-16 DIAGNOSIS — D509 Iron deficiency anemia, unspecified: Secondary | ICD-10-CM

## 2024-01-16 MED ORDER — SODIUM CHLORIDE 0.9 % IV SOLN
510.0000 mg | Freq: Once | INTRAVENOUS | Status: AC
  Administered 2024-01-16: 510 mg via INTRAVENOUS
  Filled 2024-01-16: qty 17

## 2024-01-16 MED ORDER — ACETAMINOPHEN 325 MG PO TABS
650.0000 mg | ORAL_TABLET | Freq: Once | ORAL | Status: AC
  Administered 2024-01-16: 650 mg via ORAL
  Filled 2024-01-16: qty 2

## 2024-01-16 MED ORDER — DIPHENHYDRAMINE HCL 25 MG PO CAPS
25.0000 mg | ORAL_CAPSULE | Freq: Once | ORAL | Status: AC
  Administered 2024-01-16: 25 mg via ORAL
  Filled 2024-01-16: qty 1

## 2024-01-16 NOTE — Progress Notes (Signed)
 Diagnosis: Iron Deficiency Anemia  Provider:  Chilton Greathouse MD  Procedure: IV Infusion  IV Type: Peripheral, IV Location: L Hand  Feraheme, Dose: 510 mg  Infusion Start Time: 1357  Infusion Stop Time: 1419  Post Infusion IV Care: Observation period completed and Peripheral IV Discontinued  Discharge: Condition: Good, Destination: Home . AVS Declined  Performed by:  Rico Ala, LPN

## 2024-01-17 ENCOUNTER — Encounter: Payer: Self-pay | Admitting: Neurology

## 2024-01-17 ENCOUNTER — Ambulatory Visit: Payer: Medicare Other | Admitting: Neurology

## 2024-01-17 VITALS — BP 130/60 | HR 53 | Ht 68.0 in | Wt 200.6 lb

## 2024-01-17 DIAGNOSIS — R259 Unspecified abnormal involuntary movements: Secondary | ICD-10-CM

## 2024-01-17 NOTE — Patient Instructions (Signed)
 It was good to see you today!  I don't see evidence of a primary movement disorder.  I will give recommendations to your primary neurologist.  The physicians and staff at [facility] are committed to providing excellent care. You may receive a survey requesting feedback about your experience at our office. We strive to receive "very good" responses to the survey questions. If you feel that your experience would prevent you from giving the office a "very good " response, please contact our office to try to remedy the situation. We may be reached at [phone]. Thank you for taking the time out of your busy day to complete the survey.

## 2024-01-18 ENCOUNTER — Other Ambulatory Visit: Payer: Self-pay | Admitting: Family

## 2024-01-20 DIAGNOSIS — D509 Iron deficiency anemia, unspecified: Secondary | ICD-10-CM

## 2024-01-20 HISTORY — DX: Iron deficiency anemia, unspecified: D50.9

## 2024-01-21 ENCOUNTER — Other Ambulatory Visit: Payer: Self-pay | Admitting: Family

## 2024-01-21 DIAGNOSIS — E785 Hyperlipidemia, unspecified: Secondary | ICD-10-CM

## 2024-01-24 ENCOUNTER — Ambulatory Visit: Payer: Medicare Other

## 2024-01-24 VITALS — BP 153/75 | HR 50 | Temp 97.9°F | Resp 16 | Ht 68.0 in | Wt 202.8 lb

## 2024-01-24 DIAGNOSIS — D509 Iron deficiency anemia, unspecified: Secondary | ICD-10-CM | POA: Diagnosis not present

## 2024-01-24 MED ORDER — DIPHENHYDRAMINE HCL 25 MG PO CAPS
25.0000 mg | ORAL_CAPSULE | Freq: Once | ORAL | Status: AC
  Administered 2024-01-24: 25 mg via ORAL
  Filled 2024-01-24: qty 1

## 2024-01-24 MED ORDER — ACETAMINOPHEN 325 MG PO TABS
650.0000 mg | ORAL_TABLET | Freq: Once | ORAL | Status: AC
  Administered 2024-01-24: 650 mg via ORAL
  Filled 2024-01-24: qty 2

## 2024-01-24 MED ORDER — SODIUM CHLORIDE 0.9 % IV SOLN
510.0000 mg | Freq: Once | INTRAVENOUS | Status: AC
  Administered 2024-01-24: 510 mg via INTRAVENOUS
  Filled 2024-01-24: qty 17

## 2024-01-24 NOTE — Progress Notes (Signed)
 Diagnosis: Iron Deficiency Anemia  Provider:  Chilton Greathouse MD  Procedure: IV Infusion  IV Type: Peripheral, IV Location: R Hand  Feraheme (Ferumoxytol), Dose: 510 mg  Infusion Start Time: 1341  Infusion Stop Time: 1358  Post Infusion IV Care: Observation period completed and Peripheral IV Discontinued  Discharge: Condition: Good, Destination: Home . AVS Declined  Performed by:  Garnette Czech, RN

## 2024-02-03 ENCOUNTER — Other Ambulatory Visit: Payer: Self-pay | Admitting: Family

## 2024-02-03 DIAGNOSIS — E785 Hyperlipidemia, unspecified: Secondary | ICD-10-CM

## 2024-03-14 ENCOUNTER — Encounter: Payer: Self-pay | Admitting: Family

## 2024-03-14 ENCOUNTER — Ambulatory Visit: Payer: Medicare Other | Admitting: Family

## 2024-03-14 VITALS — BP 128/68 | HR 52 | Temp 97.6°F | Resp 19 | Ht 68.0 in | Wt 207.8 lb

## 2024-03-14 DIAGNOSIS — I10 Essential (primary) hypertension: Secondary | ICD-10-CM

## 2024-03-14 DIAGNOSIS — E1142 Type 2 diabetes mellitus with diabetic polyneuropathy: Secondary | ICD-10-CM

## 2024-03-14 DIAGNOSIS — E785 Hyperlipidemia, unspecified: Secondary | ICD-10-CM

## 2024-03-14 DIAGNOSIS — E559 Vitamin D deficiency, unspecified: Secondary | ICD-10-CM

## 2024-03-14 DIAGNOSIS — N183 Chronic kidney disease, stage 3 unspecified: Secondary | ICD-10-CM

## 2024-03-14 DIAGNOSIS — F411 Generalized anxiety disorder: Secondary | ICD-10-CM

## 2024-03-14 DIAGNOSIS — E1122 Type 2 diabetes mellitus with diabetic chronic kidney disease: Secondary | ICD-10-CM | POA: Diagnosis not present

## 2024-03-14 DIAGNOSIS — G4733 Obstructive sleep apnea (adult) (pediatric): Secondary | ICD-10-CM

## 2024-03-14 DIAGNOSIS — I5032 Chronic diastolic (congestive) heart failure: Secondary | ICD-10-CM

## 2024-03-14 MED ORDER — CLONAZEPAM 0.5 MG PO TABS: 0.5000 mg | ORAL_TABLET | Freq: Every day | ORAL | 1 refills | Status: DC

## 2024-03-14 MED ORDER — BLOOD GLUCOSE MONITOR KIT: PACK | 5 refills | Status: AC

## 2024-03-14 MED ORDER — BLOOD GLUCOSE MONITOR KIT: 1.0000 | PACK | Freq: Three times a day (TID) | 5 refills | Status: DC

## 2024-03-14 MED ORDER — ACCU-CHEK AVIVA PLUS W/DEVICE KIT: PACK | 0 refills | Status: AC

## 2024-03-17 NOTE — Progress Notes (Signed)
 Provider: Christean Courts FNP-C   Vipul Cafarelli, Elijio Guadeloupe, NP  Patient Care Team: Royer Cristobal, Elijio Guadeloupe, NP as PCP - General (Family Medicine) Luella Sager, DPM as Consulting Physician (Podiatry)  Extended Emergency Contact Information Primary Emergency Contact: [facility] Address: [address] [place], [place] [address] [place] Home Phone: [phone] Mobile Phone: [phone] Relation: Spouse  Code Status:  Full Code  Goals of care: Advanced Directive information    03/14/2024   10:57 AM  Advanced Directives  Does Patient Have a Medical Advance Directive? Yes  Type of Estate agent of [place];Living will  Does patient want to make changes to medical advance directive? No - Patient declined  Copy of Healthcare Power of Attorney in Chart? Yes - validated most recent copy scanned in chart (See row information)     Chief Complaint  Patient presents with   Medical Management of Chronic Issues    4 month follow up.     Discussed the use of AI scribe software for clinical note transcription with the patient, who gave verbal consent to proceed.  History of Present Illness   George Boyer "George Boyer" is a 73 year old male who presents for a four-month follow-up after experiencing multiple falls.  He has experienced numerous falls from October to February. Extensive testing by a neurologist, including four MRIs and a CT scan, did not reveal any significant findings. His vitamin D  levels were found to be very low, and supplementation has been initiated. He is cautious about walking due to fear of falling, with his last fall occurring in February. He describes his legs as shaking before a fall, and he sometimes panics instead of lowering himself to the ground. He has been referred to a movement specialist who ruled out Parkinson's disease and other movement disorders.  He is currently taking vitamin D  50,000 IU as prescribed. He has stopped  taking several medications, including amiodarone , donepezil , Farxiga, Senokot, melatonin, Tradjenta , and cholesterol medication. He continues to take apixaban  5 mg twice daily, furosemide  40 mg as needed for swelling, Ranexa  500 mg twice daily, and trazodone 100 mg at bedtime as needed. He uses Miralax  for bowel movements and nitroglycerin  as needed, though he has not used it recently.  He has experienced swelling in his legs and feet, which he attributes to prolonged sitting. The swelling subsides overnight. His blood sugar levels have been high recently, with a reading of 318, but notes that improper hand washing may have contributed to inaccurate readings. He adjusts his Toujeo  insulin  dose based on his blood sugar levels, typically taking 10% of his blood sugar reading as his dose.  He has a history of anxiety, which affects his sleep, causing him to stay awake at night. He describes his anxiety as 'out the roof' and has not previously taken medication for it. He reports waking up multiple times at night and sometimes staying awake the entire night. His routine includes waking up around 8:00 AM, eating breakfast, and then returning to bed for a few hours.  He experiences lower back pain and buttock pain, which he associates with prolonged sitting. No chest pain or palpitations.         Past Medical History:  Diagnosis Date   Anxiety    Asthma    Atrial fibrillation (HCC)    Cellulitis of right hand 06/04/2020   CHF (congestive heart failure) (HCC)    Chronic kidney disease (  CKD)    Colon polyps    Coronary artery disease    Diabetes mellitus without complication (HCC)    Diastolic heart failure (HCC)    Gallstones    HLD (hyperlipidemia)    Hypertension    IDA (iron  deficiency anemia)    Iron  deficiency anemia 01/2024   Obesity    Pneumonia    Sleep apnea treated with continuous positive airway pressure (CPAP)    Past Surgical History:  Procedure Laterality Date   COLONOSCOPY      CORONARY STENT PLACEMENT     TONSILLECTOMY     UPPER GASTROINTESTINAL ENDOSCOPY      Allergies  Allergen Reactions   Sunflower Oil Anaphylaxis    Swelling of throat Swelling of throat Swelling of throat-any sunflower product Swelling of throat-any sunflower product    Bupropion  Other (See Comments)    Tremors   Glucophage [Metformin Hcl] Diarrhea   Ozempic (0.25 Or 0.5 Mg-Dose) [Semaglutide(0.25 Or 0.5mg -Dos)]    Semaglutide Other (See Comments)    Allergies as of 03/14/2024       Reactions   Sunflower Oil Anaphylaxis   Swelling of throat Swelling of throat Swelling of throat-any sunflower product Swelling of throat-any sunflower product   Bupropion  Other (See Comments)   Tremors   Glucophage [metformin Hcl] Diarrhea   Ozempic (0.25 Or 0.5 Mg-dose) [semaglutide(0.25 Or 0.5mg -dos)]    Semaglutide Other (See Comments)        Medication List        Accurate as of March 14, 2024 11:59 PM. If you have any questions, ask your nurse or doctor.          STOP taking these medications    amiodarone  200 MG tablet Commonly known as: PACERONE  Stopped by: Dillin Lofgren C Locklan Canoy   cholecalciferol 1000 units tablet Commonly known as: VITAMIN D  Stopped by: Geoge Lawrance C Jerek Meulemans   donepezil  10 MG tablet Commonly known as: ARICEPT  Stopped by: Mirza Kidney C Dimond Crotty   Farxiga 5 MG Tabs tablet Generic drug: dapagliflozin propanediol Stopped by: Lizmarie Witters C Alberto Schoch   Melatonin 10 MG Tabs Stopped by: Kaylum Shrum C Kemar Pandit   pravastatin  20 MG tablet Commonly known as: PRAVACHOL  Stopped by: Creg Gilmer C Estevon Fluke   senna 8.6 MG tablet Commonly known as: SENOKOT Stopped by: Eliyas Suddreth C Wendelyn Kiesling   Tradjenta  5 MG Tabs tablet Generic drug: linagliptin  Stopped by: Tanji Storrs C Samirah Scarpati       TAKE these medications    Accu-Chek Aviva Plus test strip Generic drug: glucose blood USE TO TEST BLOOD SUGARS FOUR TIMES DAILY DX E11.9 AND Z79.4   Accu-Chek Aviva Plus w/Device Kit Use to test blood sugar four  times daily. Dx: E11.42   Accu-Chek FastClix Lancets Misc Use to monitor blood glucose 4 time(s) daily DX CODE E11.9 and Z79.4   B-D UF III MINI PEN NEEDLES 31G X 5 MM Misc Generic drug: Insulin  Pen Needle SMARTSIG:Injection Daily   blood glucose meter kit and supplies Kit Dispense based on patient and insurance preference. Use up to four times daily as directed.Accu check Dx:E11.42 What changed:  how much to take how to take this when to take this additional instructions Changed by: Zamora Colton C Journei Thomassen   clonazePAM  0.5 MG tablet Commonly known as: KLONOPIN  Take 1 tablet (0.5 mg total) by mouth at bedtime. Started by: Kedron Uno C Karisma Meiser   Eliquis  5 MG Tabs tablet Generic drug: apixaban  Take 5 mg by mouth 2 (two) times daily.   furosemide  40 MG tablet Commonly known as: LASIX   Take 40 mg by mouth daily.   INSULIN  SYRINGE 1CC/31GX5/16" 31G X 5/16" 1 ML Misc Use with insulin  five (5) times daily  DX E11.42 and Z79.4   nitroGLYCERIN  0.4 MG SL tablet Commonly known as: NITROSTAT  Place 1 tablet (0.4 mg total) under the tongue every 5 (five) minutes as needed for chest pain.   ranolazine  500 MG 12 hr tablet Commonly known as: RANEXA  Take 500 mg by mouth 2 (two) times daily.   Toujeo  SoloStar 300 UNIT/ML Solostar Pen Generic drug: insulin  glargine (1 Unit Dial) Inject 25 Units into the skin in the morning and at bedtime.   traZODone 100 MG tablet Commonly known as: DESYREL Take 100 mg by mouth at bedtime.   Vitamin D  (Ergocalciferol ) 1.25 MG (50000 UNIT) Caps capsule Commonly known as: DRISDOL  TAKE 1 CAPSULE BY MOUTH ONCE A WEEK        Review of Systems  Constitutional:  Negative for appetite change, chills, fatigue, fever and unexpected weight change.  HENT:  Negative for congestion, dental problem, ear discharge, ear pain, facial swelling, hearing loss, nosebleeds, postnasal drip, rhinorrhea, sinus pressure, sinus pain, sneezing, sore throat, tinnitus and trouble  swallowing.   Eyes:  Negative for pain, discharge, redness, itching and visual disturbance.  Respiratory:  Negative for cough, chest tightness, shortness of breath and wheezing.   Cardiovascular:  Positive for leg swelling. Negative for chest pain and palpitations.  Gastrointestinal:  Negative for abdominal distention, abdominal pain, blood in stool, constipation, diarrhea, nausea and vomiting.  Endocrine: Negative for cold intolerance, heat intolerance, polydipsia, polyphagia and polyuria.  Genitourinary:  Negative for difficulty urinating, dysuria, flank pain, frequency and urgency.  Musculoskeletal:  Positive for back pain. Negative for arthralgias, gait problem, joint swelling, myalgias, neck pain and neck stiffness.  Skin:  Negative for color change, pallor, rash and wound.  Neurological:  Negative for dizziness, syncope, speech difficulty, weakness, light-headedness, numbness and headaches.  Hematological:  Does not bruise/bleed easily.  Psychiatric/Behavioral:  Negative for agitation, behavioral problems, confusion, hallucinations, self-injury, sleep disturbance and suicidal ideas. The patient is nervous/anxious.     Immunization History  Administered Date(s) Administered   Fluad Quad(high Dose 65+) 08/13/2020   Fluad Trivalent(High Dose 65+) 09/11/2023   Influenza Inj Mdck Quad Pf 10/05/2021   Influenza,inj,quad, With Preservative 08/17/2018   Influenza-Unspecified 08/17/2018, 08/17/2018, 08/01/2019, 08/01/2019   PFIZER(Purple Top)SARS-COV-2 Vaccination 12/10/2019, 12/31/2019   PNEUMOCOCCAL CONJUGATE-20 10/05/2021   Pneumococcal Polysaccharide-23 07/09/2019   Pneumococcal-Unspecified 07/09/2019   Pertinent  Health Maintenance Due  Topic Date Due   OPHTHALMOLOGY EXAM  04/21/2024   INFLUENZA VACCINE  06/21/2024   HEMOGLOBIN A1C  09/13/2024   Colonoscopy  10/02/2024   FOOT EXAM  10/09/2024      12/14/2023    1:12 PM 01/16/2024    1:04 PM 01/17/2024   10:05 AM 01/24/2024    1:05  PM 03/14/2024   10:54 AM  Fall Risk  Falls in the past year? 1 1 1 1 1   Was there an injury with Fall? 1 1 1 1 1   Was there an injury with Fall? - Comments   sprang knee/ hit head once    Fall Risk Category Calculator 3 3 3 3 2   Patient at Risk for Falls Due to History of fall(s);Impaired balance/gait;Impaired mobility Impaired balance/gait;Impaired mobility  Impaired balance/gait History of fall(s)  Fall risk Follow up Falls evaluation completed;Education provided;Falls prevention discussed Falls evaluation completed Falls evaluation completed Falls evaluation completed Falls evaluation completed   Functional Status Survey:  Vitals:   03/14/24 1102  BP: 128/68  Pulse: (!) 52  Resp: 19  Temp: 97.6 F (36.4 C)  SpO2: 96%  Weight: 207 lb 12.8 oz (94.3 kg)  Height: 5\' 8"  (1.727 m)   Body mass index is 31.6 kg/m. Physical Exam GENERAL: Alert, cooperative, well developed, no acute distress HEENT: Normocephalic, normal oropharynx, moist mucous membranes, ears and eyes normal CHEST: Clear to auscultation bilaterally, no wheezes, rhonchi, or crackles, normal to percussion CARDIOVASCULAR: Normal heart rate and rhythm, S1 and S2 normal without murmurs ABDOMEN: Soft, non-tender, non-distended, without organomegaly, normal bowel sounds EXTREMITIES: No cyanosis, no pitting edema, left leg swollen NEUROLOGICAL: Cranial nerves grossly intact, moves all extremities without gross motor or sensory deficit, sensation intact in lower extremities  SKIN: No rash,no lesion or erythema   PSYCHIATRY/BEHAVIORAL: Mood stable    Labs reviewed: Recent Labs    09/01/23 1223 09/02/23 0314 09/03/23 0356 09/04/23 0333 12/14/23 1425 03/14/24 1200  NA  --  139   < > 140 139 139  K  --  4.7   < > 3.8 4.4 4.6  CL  --  107   < > 103 102 105  CO2  --  25   < > 26 29 25   GLUCOSE  --  98   < > 97 195* 257*  BUN  --  38*   < > 32* 27* 27*  CREATININE  --  2.55*   < > 2.21* 2.14* 1.56*  CALCIUM  --  8.7*    < > 9.0 9.1 9.1  MG 2.3 2.3  --   --   --   --    < > = values in this interval not displayed.   Recent Labs    09/01/23 1143 09/02/23 0314 09/03/23 0356 12/14/23 1425 03/14/24 1200  AST 20 12* 16 12 11   ALT 14 17 19 13 9   ALKPHOS 68 63 71  --   --   BILITOT 0.7 0.8 1.0 0.7 0.5  PROT 7.1 6.1* 7.1 6.6 6.0*  ALBUMIN 3.4* 2.7* 3.2*  --   --    Recent Labs    11/23/23 1117 12/14/23 1425 03/14/24 1200  WBC 6.3 7.4 6.4  NEUTROABS 3.7 3,885 3,565  HGB 10.7* 10.7* 13.2  HCT 33.0* 33.5* 40.6  MCV 84.5 87.9 92.5  PLT 309.0 349 257   Lab Results  Component Value Date   TSH 0.62 03/14/2024   Lab Results  Component Value Date   HGBA1C 8.3 (H) 03/14/2024   Lab Results  Component Value Date   CHOL 214 (H) 03/14/2024   HDL 47 03/14/2024   LDLCALC 141 (H) 03/14/2024   TRIG 140 03/14/2024   CHOLHDL 4.6 03/14/2024    Significant Diagnostic Results in last 30 days:  No results found.  Assessment/Plan  Type 2 diabetes mellitus with hyperglycemia Blood glucose levels are variable, with recent readings up to 318 mg/dL. Concerns about inaccurate readings due to improper hand hygiene before testing. Current insulin  regimen is 10% of blood glucose reading. A1c was 8.9% three months ago. He is not taking Farxiga or Tradjenta . - Recheck A1c level - Prescribe new Accu-Check device - Advise on proper hand hygiene before blood glucose testing - Encourage reduction of sugary foods and drinks  Chronic kidney disease, unspecified Slightly elevated kidney function, no changes in management. - Recheck kidney function  Edema of lower extremities Intermittent ankle and foot swelling, likely due to prolonged sitting, resolves overnight. He is on furosemide   as needed. - Encourage regular movement and leg elevation - Monitor for abrupt weight gain indicating fluid retention  Anxiety disorder Significant anxiety, particularly at night, affecting sleep. No prior medication for anxiety.  Trazodone used occasionally for sleep but does not address anxiety. Clonazepam  discussed as a treatment option, focusing on minimizing drowsiness and fall risk. - Prescribe clonazepam  for anxiety at bedtime - Discuss potential drowsiness and risk of falls with clonazepam  - Sign controlled substance contract for clonazepam  - Monitor response to clonazepam  and adjust dose if necessary  Falls Multiple falls reported between October and February. Neurological evaluation ruled out movement disorders. Falls may be related to anxiety and leg weakness. No falls in the past two months. - Encourage safe practices if feeling unsteady, such as lowering to the ground  Vitamin D  deficiency Previously identified vitamin D  deficiency. He is on vitamin D  50,000 IU prescription. - Recheck vitamin D  level   Family/ staff Communication: Reviewed plan of care with patient verbalized understanding  Labs/tests ordered:  - CBC with Differential/Platelet - CMP with eGFR([facility]) - TSH - Hgb A1C - Lipid panel - Vitamin D  level    Next Appointment : Return in about 6 months (around 09/13/2024) for medical mangement of chronic issues.Aaron Aas   Spent 30 minutes of Face to face and non-face to face with patient  >50% time spent counseling; reviewing medical record; tests; labs; documentation and developing future plan of care.   Estil Heman, NP

## 2024-03-18 LAB — CBC WITH DIFFERENTIAL/PLATELET
Absolute Lymphocytes: 1946 {cells}/uL (ref 850–3900)
Absolute Monocytes: 685 {cells}/uL (ref 200–950)
Basophils Absolute: 58 {cells}/uL (ref 0–200)
Basophils Relative: 0.9 %
Eosinophils Absolute: 147 {cells}/uL (ref 15–500)
Eosinophils Relative: 2.3 %
HCT: 40.6 % (ref 38.5–50.0)
Hemoglobin: 13.2 g/dL (ref 13.2–17.1)
MCH: 30.1 pg (ref 27.0–33.0)
MCHC: 32.5 g/dL (ref 32.0–36.0)
MCV: 92.5 fL (ref 80.0–100.0)
MPV: 11.5 fL (ref 7.5–12.5)
Monocytes Relative: 10.7 %
Neutro Abs: 3565 {cells}/uL (ref 1500–7800)
Neutrophils Relative %: 55.7 %
Platelets: 257 10*3/uL (ref 140–400)
RBC: 4.39 10*6/uL (ref 4.20–5.80)
RDW: 14.2 % (ref 11.0–15.0)
Total Lymphocyte: 30.4 %
WBC: 6.4 10*3/uL (ref 3.8–10.8)

## 2024-03-18 LAB — TSH: TSH: 0.62 m[IU]/L (ref 0.40–4.50)

## 2024-03-18 LAB — LIPID PANEL
Cholesterol: 214 mg/dL — ABNORMAL HIGH (ref <200)
HDL: 47 mg/dL (ref >=40)
LDL Cholesterol (Calc): 141 mg/dL — ABNORMAL HIGH
Non-HDL Cholesterol (Calc): 167 mg/dL — ABNORMAL HIGH (ref <130)
Total CHOL/HDL Ratio: 4.6 (calc) (ref <5.0)
Triglycerides: 140 mg/dL (ref <150)

## 2024-03-18 LAB — VITAMIN D 1,25 DIHYDROXY
Vitamin D 1, 25 (OH)2 Total: 28 pg/mL (ref 18–72)
Vitamin D2 1, 25 (OH)2: 28 pg/mL
Vitamin D3 1, 25 (OH)2: <8 pg/mL

## 2024-03-18 LAB — HEMOGLOBIN A1C
Hgb A1c MFr Bld: 8.3 % — ABNORMAL HIGH (ref <5.7)
Mean Plasma Glucose: 192 mg/dL
eAG (mmol/L): 10.6 mmol/L

## 2024-03-18 LAB — COMPLETE METABOLIC PANEL WITHOUT GFR
AG Ratio: 1.1 (calc) (ref 1.0–2.5)
ALT: 9 U/L (ref 9–46)
AST: 11 U/L (ref 10–35)
Albumin: 3.1 g/dL — ABNORMAL LOW (ref 3.6–5.1)
Alkaline phosphatase (APISO): 72 U/L (ref 35–144)
BUN/Creatinine Ratio: 17 (calc) (ref 6–22)
BUN: 27 mg/dL — ABNORMAL HIGH (ref 7–25)
CO2: 25 mmol/L (ref 20–32)
Calcium: 9.1 mg/dL (ref 8.6–10.3)
Chloride: 105 mmol/L (ref 98–110)
Creat: 1.56 mg/dL — ABNORMAL HIGH (ref 0.70–1.28)
Globulin: 2.9 g/dL (ref 1.9–3.7)
Glucose, Bld: 257 mg/dL — ABNORMAL HIGH (ref 65–139)
Potassium: 4.6 mmol/L (ref 3.5–5.3)
Sodium: 139 mmol/L (ref 135–146)
Total Bilirubin: 0.5 mg/dL (ref 0.2–1.2)
Total Protein: 6 g/dL — ABNORMAL LOW (ref 6.1–8.1)

## 2024-03-22 ENCOUNTER — Other Ambulatory Visit: Payer: Self-pay

## 2024-03-22 MED ORDER — ATORVASTATIN CALCIUM 10 MG PO TABS: 10.0000 mg | ORAL_TABLET | Freq: Every day | ORAL | 3 refills | Status: DC

## 2024-04-09 ENCOUNTER — Other Ambulatory Visit: Payer: Self-pay

## 2024-04-09 ENCOUNTER — Emergency Department (HOSPITAL_COMMUNITY)

## 2024-04-09 ENCOUNTER — Encounter (HOSPITAL_COMMUNITY): Payer: Self-pay | Admitting: Emergency Medicine

## 2024-04-09 ENCOUNTER — Emergency Department (HOSPITAL_COMMUNITY)
Admission: EM | Admit: 2024-04-09 | Discharge: 2024-04-09 | Disposition: A | Discharge status: Home / Self Care | Attending: Emergency Medicine | Admitting: Emergency Medicine

## 2024-04-09 DIAGNOSIS — E119 Type 2 diabetes mellitus without complications: Secondary | ICD-10-CM | POA: Diagnosis not present

## 2024-04-09 DIAGNOSIS — I509 Heart failure, unspecified: Secondary | ICD-10-CM | POA: Diagnosis not present

## 2024-04-09 DIAGNOSIS — Z7901 Long term (current) use of anticoagulants: Secondary | ICD-10-CM | POA: Diagnosis not present

## 2024-04-09 DIAGNOSIS — Y9301 Activity, walking, marching and hiking: Secondary | ICD-10-CM | POA: Diagnosis not present

## 2024-04-09 DIAGNOSIS — N189 Chronic kidney disease, unspecified: Secondary | ICD-10-CM | POA: Diagnosis not present

## 2024-04-09 DIAGNOSIS — M549 Dorsalgia, unspecified: Secondary | ICD-10-CM | POA: Diagnosis not present

## 2024-04-09 DIAGNOSIS — S3991XA Unspecified injury of abdomen, initial encounter: Secondary | ICD-10-CM | POA: Diagnosis not present

## 2024-04-09 DIAGNOSIS — Y92009 Unspecified place in unspecified non-institutional (private) residence as the place of occurrence of the external cause: Secondary | ICD-10-CM | POA: Insufficient documentation

## 2024-04-09 DIAGNOSIS — M25511 Pain in right shoulder: Secondary | ICD-10-CM | POA: Diagnosis not present

## 2024-04-09 DIAGNOSIS — Z794 Long term (current) use of insulin: Secondary | ICD-10-CM | POA: Insufficient documentation

## 2024-04-09 DIAGNOSIS — I2510 Atherosclerotic heart disease of native coronary artery without angina pectoris: Secondary | ICD-10-CM | POA: Diagnosis not present

## 2024-04-09 DIAGNOSIS — S299XXA Unspecified injury of thorax, initial encounter: Secondary | ICD-10-CM | POA: Diagnosis not present

## 2024-04-09 DIAGNOSIS — S0990XA Unspecified injury of head, initial encounter: Secondary | ICD-10-CM | POA: Insufficient documentation

## 2024-04-09 DIAGNOSIS — I130 Hypertensive heart and chronic kidney disease with heart failure and stage 1 through stage 4 chronic kidney disease, or unspecified chronic kidney disease: Secondary | ICD-10-CM | POA: Diagnosis not present

## 2024-04-09 DIAGNOSIS — W108XXA Fall (on) (from) other stairs and steps, initial encounter: Secondary | ICD-10-CM | POA: Diagnosis not present

## 2024-04-09 DIAGNOSIS — W19XXXA Unspecified fall, initial encounter: Secondary | ICD-10-CM

## 2024-04-09 LAB — COMPREHENSIVE METABOLIC PANEL WITH GFR
ALT: 14 U/L (ref 0–44)
AST: 17 U/L (ref 15–41)
Albumin: 2.6 g/dL — ABNORMAL LOW (ref 3.5–5.0)
Alkaline Phosphatase: 59 U/L (ref 38–126)
Anion gap: 8 (ref 5–15)
BUN: 29 mg/dL — ABNORMAL HIGH (ref 8–23)
CO2: 26 mmol/L (ref 22–32)
Calcium: 8.6 mg/dL — ABNORMAL LOW (ref 8.9–10.3)
Chloride: 102 mmol/L (ref 98–111)
Creatinine, Ser: 1.89 mg/dL — ABNORMAL HIGH (ref 0.61–1.24)
GFR, Estimated: 37 mL/min — ABNORMAL LOW (ref >60)
Glucose, Bld: 339 mg/dL — ABNORMAL HIGH (ref 70–99)
Potassium: 4.2 mmol/L (ref 3.5–5.1)
Sodium: 136 mmol/L (ref 135–145)
Total Bilirubin: 0.8 mg/dL (ref 0.0–1.2)
Total Protein: 5.8 g/dL — ABNORMAL LOW (ref 6.5–8.1)

## 2024-04-09 LAB — I-STAT CHEM 8, ED
BUN: 30 mg/dL — ABNORMAL HIGH (ref 8–23)
Calcium, Ion: 1.08 mmol/L — ABNORMAL LOW (ref 1.15–1.40)
Chloride: 100 mmol/L (ref 98–111)
Creatinine, Ser: 2 mg/dL — ABNORMAL HIGH (ref 0.61–1.24)
Glucose, Bld: 340 mg/dL — ABNORMAL HIGH (ref 70–99)
HCT: 35 % — ABNORMAL LOW (ref 39.0–52.0)
Hemoglobin: 11.9 g/dL — ABNORMAL LOW (ref 13.0–17.0)
Potassium: 4.2 mmol/L (ref 3.5–5.1)
Sodium: 137 mmol/L (ref 135–145)
TCO2: 24 mmol/L (ref 22–32)

## 2024-04-09 LAB — CBC
HCT: 35.2 % — ABNORMAL LOW (ref 39.0–52.0)
Hemoglobin: 11.9 g/dL — ABNORMAL LOW (ref 13.0–17.0)
MCH: 30.7 pg (ref 26.0–34.0)
MCHC: 33.8 g/dL (ref 30.0–36.0)
MCV: 91 fL (ref 80.0–100.0)
Platelets: 213 10*3/uL (ref 150–400)
RBC: 3.87 MIL/uL — ABNORMAL LOW (ref 4.22–5.81)
RDW: 14 % (ref 11.5–15.5)
WBC: 6.8 10*3/uL (ref 4.0–10.5)
nRBC: 0 % (ref 0.0–0.2)

## 2024-04-09 LAB — ETHANOL: Alcohol, Ethyl (B): <15 mg/dL (ref <15)

## 2024-04-09 LAB — MAGNESIUM: Magnesium: 1.9 mg/dL (ref 1.7–2.4)

## 2024-04-09 MED ORDER — ACETAMINOPHEN 325 MG PO TABS: 650.0000 mg | ORAL_TABLET | Freq: Once | ORAL | Status: DC

## 2024-04-09 MED ORDER — LACTATED RINGERS IV BOLUS
500.0000 mL | Freq: Once | INTRAVENOUS | Status: AC
Start: 2024-04-09 — End: 2024-04-09
  Administered 2024-04-09: 500 mL via INTRAVENOUS

## 2024-04-09 NOTE — ED Provider Notes (Signed)
 [place] [facility] DE[facility]MENT AT [facility] Provider Note   CSN: [id number] Arrival date & time: 04/09/24  1315     History  Chief Complaint  Patient presents with   Fall    FOT    George Boyer is a 73 y.o. male.  HPI Patient presents after a fall.  Medical history includes HTN, DM, CKD, OSA, aortic stenosis, CAD, carotid artery stenosis, anxiety, depression, HLD, anemia, CHF, mild cognitive impairment.  He is on Eliquis .  Last dose was this morning.  Shortly prior to arrival, he was at home.  While walking up steps, he was tripped up by his dogs.  This caused him to lose his balance and fall backwards.  He fell backwards off of the first step.  He struck the top of his head and his back.  He was unable to stand up.  EMS was called to the scene.  EMS loaded him onto stretcher.  Currently, patient endorses some mild pain in his right shoulder which he states was there before the fall.  He has a mild back pain and headache.  He denies any other areas of discomfort.    Home Medications Prior to Admission medications   Medication Sig Start Date End Date Taking? Authorizing Provider  apixaban  (ELIQUIS ) 5 MG TABS tablet Take 5 mg by mouth 2 (two) times daily.   Yes [provider]  clonazePAM  (KLONOPIN ) 0.5 MG tablet Take 1 tablet (0.5 mg total) by mouth at bedtime. 03/14/24  Yes [name], [name], NP  furosemide  (LASIX ) 40 MG tablet Take 40 mg by mouth daily.   Yes [provider]  pravastatin  (PRAVACHOL ) 20 MG tablet Take 20 mg by mouth every evening.   Yes [provider]  ranolazine  (RANEXA ) 500 MG 12 hr tablet Take 500 mg by mouth 2 (two) times daily. 09/13/18  Yes [provider]  TOUJEO  SOLOSTAR 300 UNIT/ML Solostar Pen Inject 25 Units into the skin in the morning and at bedtime. 12/28/23  Yes Medina-Vargas, Monina C, NP  traZODone (DESYREL) 100 MG tablet Take 100 mg by mouth at bedtime.   Yes [provider]  Vitamin D ,  Ergocalciferol , (DRISDOL ) 1.25 MG (50000 UNIT) CAPS capsule TAKE 1 CAPSULE BY MOUTH ONCE A WEEK 01/18/24  Yes [name], [name], NP  ACCU-CHEK AVIVA PLUS test strip USE TO TEST BLOOD SUGARS FOUR TIMES DAILY DX E11.9 AND Z79.4 09/03/18   [provider]  Accu-Chek FastClix Lancets MISC Use to monitor blood glucose 4 time(s) daily DX CODE E11.9 and Z79.4 02/08/19   [provider]  atorvastatin  (LIPITOR) 10 MG tablet Take 1 tablet (10 mg total) by mouth daily. Patient not taking: Reported on 04/09/2024 03/22/24   [name], Elijio [place], NP  B-D UF III MINI PEN NEEDLES 31G X 5 MM MISC SMARTSIG:Injection Daily 09/29/23   [provider]  blood glucose meter kit and supplies KIT Dispense based on patient and insurance preference. Use up to four times daily as directed.Accu check Dx:E11.42 03/14/24   [name], [name], NP  Blood Glucose Monitoring Suppl (ACCU-CHEK AVIVA PLUS) w/Device KIT Use to test blood sugar four times daily. Dx: E11.42 03/14/24   [name], Elijio [place], NP  Insulin  Syringe-Needle U-100 (INSULIN  SYRINGE 1CC/31GX5/16") 31G X 5/16" 1 ML MISC Use with insulin  five (5) times daily  DX E11.42 and Z79.4 02/08/19   [provider]  nitroGLYCERIN  (NITROSTAT ) 0.4 MG SL tablet Place 1 tablet (0.4 mg total) under the tongue every 5 (  five) minutes as needed for chest pain. 12/14/23   [name], [name], NP      Allergies    Sunflower oil, Bupropion , Glucophage [metformin hcl], Ozempic (0.25 or 0.5 mg-dose) [semaglutide(0.25 or 0.5mg -dos)], and Semaglutide    Review of Systems   Review of Systems  Musculoskeletal:  Positive for arthralgias and back pain.  Neurological:  Positive for headaches.  All other systems reviewed and are negative.   Physical Exam Updated Vital Signs BP (!) 163/64   Pulse 63   Temp 98.1 F (36.7 C) (Oral)   Resp 14   Ht 5\' 8"  (1.727 m)   Wt 90.7 kg   SpO2 100%   BMI 30.41 kg/m  Physical Exam Vitals and nursing note reviewed.  Constitutional:       General: He is not in acute distress.    Appearance: Normal appearance. He is well-developed. He is not ill-appearing, toxic-appearing or diaphoretic.  HENT:     Head: Normocephalic and atraumatic.     Right Ear: External ear normal.     Left Ear: External ear normal.     Nose: Nose normal.     Mouth/Throat:     Mouth: Mucous membranes are moist.  Eyes:     Extraocular Movements: Extraocular movements intact.     Conjunctiva/sclera: Conjunctivae normal.  Cardiovascular:     Rate and Rhythm: Normal rate and regular rhythm.  Pulmonary:     Effort: Pulmonary effort is normal. No respiratory distress.  Chest:     Chest wall: No tenderness.  Abdominal:     General: There is no distension.     Palpations: Abdomen is soft.     Tenderness: There is no abdominal tenderness.  Musculoskeletal:        General: No swelling or deformity. Normal range of motion.     Cervical back: Normal range of motion and neck supple.  Skin:    General: Skin is warm and dry.     Coloration: Skin is not jaundiced or pale.  Neurological:     General: No focal deficit present.     Mental Status: He is alert and oriented to person, place, and time.  Psychiatric:        Mood and Affect: Mood normal.        Behavior: Behavior normal.     ED Results / Procedures / Treatments   Labs (all labs ordered are listed, but only abnormal results are displayed) Labs Reviewed  COMPREHENSIVE METABOLIC PANEL WITH GFR - Abnormal; Notable for the following components:      Result Value   Glucose, Bld 339 (*)    BUN 29 (*)    Creatinine, Ser 1.89 (*)    Calcium  8.6 (*)    Total Protein 5.8 (*)    Albumin 2.6 (*)    GFR, Estimated 37 (*)    All other components within normal limits  CBC - Abnormal; Notable for the following components:   RBC 3.87 (*)    Hemoglobin 11.9 (*)    HCT 35.2 (*)    All other components within normal limits  I-STAT CHEM 8, ED - Abnormal; Notable for the following components:   BUN 30  (*)    Creatinine, Ser 2.00 (*)    Glucose, Bld 340 (*)    Calcium , Ion 1.08 (*)    Hemoglobin 11.9 (*)    HCT 35.0 (*)    All other components within normal limits  MAGNESIUM  ETHANOL  URINALYSIS, ROUTINE W  REFLEX MICROSCOPIC    EKG EKG Interpretation Date/Time:  Tuesday Apr 09 2024 13:56:44 EDT Ventricular Rate:  63 PR Interval:    QRS Duration:  158 QT Interval:  514 QTC Calculation: 527 R Axis:   -53  Text Interpretation: Sinus rhythm RBBB and LAFB Confirmed by Iva Mariner [phone]) on 04/09/2024 2:14:45 PM  Radiology DG Pelvis Portable Result Date: 04/09/2024 CLINICAL DATA:  Trauma.  Fall. EXAM: PORTABLE PELVIS 1-2 VIEWS COMPARISON:  None Available. FINDINGS: Pelvis is intact with normal and symmetric sacroiliac joints. No acute fracture or dislocation. No aggressive osseous lesion. Visualized sacral arcuate lines are unremarkable. Unremarkable symphysis pubis. There are mild degenerative changes of bilateral hip joints without significant joint space narrowing. Osteophytosis of the superior acetabulum. No radiopaque foreign bodies. IMPRESSION: No acute osseous abnormality of the pelvis. Electronically Signed   By: Beula Brunswick M.D.   On: 04/09/2024 15:03   DG Chest Port 1 View Result Date: 04/09/2024 CLINICAL DATA:  Trauma. EXAM: PORTABLE CHEST 1 VIEW COMPARISON:  09/01/2023. FINDINGS: Low lung volume. Bilateral lung fields are clear. Bilateral costophrenic angles are clear. Elevated right hemidiaphragm. Stable cardio-mediastinal silhouette. No acute osseous abnormalities. The soft tissues are within normal limits. IMPRESSION: No active disease. Electronically Signed   By: Beula Brunswick M.D.   On: 04/09/2024 15:02   CT T-SPINE NO CHARGE Result Date: 04/09/2024 CLINICAL DATA:  Mechanical fall.  Patient fell backwards. EXAM: CT Thoracic and Lumbar spine without contrast TECHNIQUE: Multiplanar CT images of the thoracic and lumbar spine were reconstructed from contemporary CT of the  Chest, Abdomen, and Pelvis. RADIATION DOSE REDUCTION: This exam was performed according to the departmental dose-optimization program which includes automated exposure control, adjustment of the mA and/or kV according to patient size and/or use of iterative reconstruction technique. CONTRAST:  None or No additional COMPARISON:  None Available. FINDINGS: CT THORACIC SPINE FINDINGS Alignment: Normal. Vertebrae: No acute fracture or focal pathologic process. Paraspinal and other soft tissues: Negative. Disc levels: Moderate multilevel degenerative changes characterized by marginal osteophyte formation. CT LUMBAR SPINE FINDINGS Segmentation: 5 lumbar type vertebrae. Alignment: Normal. Vertebrae: No acute fracture or focal pathologic process. Paraspinal and other soft tissues: Negative. Disc levels: Moderate multilevel degenerative changes characterized by marginal osteophyte formation. Mildly reduced T12-L1 and L1-2 intervertebral disc heights. IMPRESSION: *No acute traumatic injury to the thoracolumbar spine. Multilevel degenerative changes. Electronically Signed   By: Beula Brunswick M.D.   On: 04/09/2024 15:02   CT L-SPINE NO CHARGE Result Date: 04/09/2024 CLINICAL DATA:  Mechanical fall.  Patient fell backwards. EXAM: CT Thoracic and Lumbar spine without contrast TECHNIQUE: Multiplanar CT images of the thoracic and lumbar spine were reconstructed from contemporary CT of the Chest, Abdomen, and Pelvis. RADIATION DOSE REDUCTION: This exam was performed according to the departmental dose-optimization program which includes automated exposure control, adjustment of the mA and/or kV according to patient size and/or use of iterative reconstruction technique. CONTRAST:  None or No additional COMPARISON:  None Available. FINDINGS: CT THORACIC SPINE FINDINGS Alignment: Normal. Vertebrae: No acute fracture or focal pathologic process. Paraspinal and other soft tissues: Negative. Disc levels: Moderate multilevel degenerative  changes characterized by marginal osteophyte formation. CT LUMBAR SPINE FINDINGS Segmentation: 5 lumbar type vertebrae. Alignment: Normal. Vertebrae: No acute fracture or focal pathologic process. Paraspinal and other soft tissues: Negative. Disc levels: Moderate multilevel degenerative changes characterized by marginal osteophyte formation. Mildly reduced T12-L1 and L1-2 intervertebral disc heights. IMPRESSION: *No acute traumatic injury to the thoracolumbar spine. Multilevel degenerative changes. Electronically  Signed   By: Beula Brunswick M.D.   On: 04/09/2024 15:02   CT HEAD WO CONTRAST Result Date: 04/09/2024 CLINICAL DATA:  Head trauma, moderate-severe; Polytrauma, blunt. EXAM: CT HEAD WITHOUT CONTRAST CT CERVICAL SPINE WITHOUT CONTRAST TECHNIQUE: Multidetector CT imaging of the head and cervical spine was performed following the standard protocol without intravenous contrast. Multiplanar CT image reconstructions of the cervical spine were also generated. RADIATION DOSE REDUCTION: This exam was performed according to the departmental dose-optimization program which includes automated exposure control, adjustment of the mA and/or kV according to patient size and/or use of iterative reconstruction technique. COMPARISON:  CT scan head and cervical spine from 06/18/2022. FINDINGS: CT HEAD FINDINGS Brain: No evidence of acute infarction, hemorrhage, hydrocephalus, extra-axial collection or mass lesion/mass effect. There is bilateral periventricular hypodensity, which is non-specific but most likely seen in the settings of microvascular ischemic changes. Mild in extent. Bilateral basal ganglia calcifications noted. Otherwise normal appearance of brain parenchyma. Cerebral volume loss with enlargement of the ventricles. Vascular: No hyperdense vessel or unexpected calcification. Intracranial arteriosclerosis. Skull: Normal. Negative for fracture or focal lesion. Sinuses/Orbits: No acute finding. Other: Visualized  mastoid air cells are unremarkable. No mastoid effusion. CT CERVICAL SPINE FINDINGS Alignment: Normal. This examination does not assess for ligamentous injury or stability. Skull base and vertebrae: No acute fracture. No primary bone lesion or focal pathologic process. Subtle mildly displaced osteophyte versus fracture deformity of the posteroinferior C6 vertebral body on the right side (series 9, image 49) is similar to the prior study. Soft tissues and spinal canal: No prevertebral fluid or swelling. No visible canal hematoma. Disc levels: Mild-to-moderate multilevel degenerative changes characterized by reduced intervertebral disc height (mainly at C3-4 and C6-7 levels) and mild to moderate multilevel marginal osteophyte formation. Upper chest: Negative. Other: None. IMPRESSION: 1. No acute intracranial abnormality. 2. No acute osseous injury of the cervical spine. 3. Multilevel degenerative changes of the cervical spine, as described above. Electronically Signed   By: Beula Brunswick M.D.   On: 04/09/2024 14:58   CT CERVICAL SPINE WO CONTRAST Result Date: 04/09/2024 CLINICAL DATA:  Head trauma, moderate-severe; Polytrauma, blunt. EXAM: CT HEAD WITHOUT CONTRAST CT CERVICAL SPINE WITHOUT CONTRAST TECHNIQUE: Multidetector CT imaging of the head and cervical spine was performed following the standard protocol without intravenous contrast. Multiplanar CT image reconstructions of the cervical spine were also generated. RADIATION DOSE REDUCTION: This exam was performed according to the departmental dose-optimization program which includes automated exposure control, adjustment of the mA and/or kV according to patient size and/or use of iterative reconstruction technique. COMPARISON:  CT scan head and cervical spine from 06/18/2022. FINDINGS: CT HEAD FINDINGS Brain: No evidence of acute infarction, hemorrhage, hydrocephalus, extra-axial collection or mass lesion/mass effect. There is bilateral periventricular  hypodensity, which is non-specific but most likely seen in the settings of microvascular ischemic changes. Mild in extent. Bilateral basal ganglia calcifications noted. Otherwise normal appearance of brain parenchyma. Cerebral volume loss with enlargement of the ventricles. Vascular: No hyperdense vessel or unexpected calcification. Intracranial arteriosclerosis. Skull: Normal. Negative for fracture or focal lesion. Sinuses/Orbits: No acute finding. Other: Visualized mastoid air cells are unremarkable. No mastoid effusion. CT CERVICAL SPINE FINDINGS Alignment: Normal. This examination does not assess for ligamentous injury or stability. Skull base and vertebrae: No acute fracture. No primary bone lesion or focal pathologic process. Subtle mildly displaced osteophyte versus fracture deformity of the posteroinferior C6 vertebral body on the right side (series 9, image 49) is similar to the prior study. Soft  tissues and spinal canal: No prevertebral fluid or swelling. No visible canal hematoma. Disc levels: Mild-to-moderate multilevel degenerative changes characterized by reduced intervertebral disc height (mainly at C3-4 and C6-7 levels) and mild to moderate multilevel marginal osteophyte formation. Upper chest: Negative. Other: None. IMPRESSION: 1. No acute intracranial abnormality. 2. No acute osseous injury of the cervical spine. 3. Multilevel degenerative changes of the cervical spine, as described above. Electronically Signed   By: Beula Brunswick M.D.   On: 04/09/2024 14:58   CT CHEST ABDOMEN PELVIS WO CONTRAST Result Date: 04/09/2024 CLINICAL DATA:  Polytrauma, blunt. Mechanical fall on blood thinners. Patient fell backwards. EXAM: CT CHEST, ABDOMEN AND PELVIS WITHOUT CONTRAST TECHNIQUE: Multidetector CT imaging of the chest, abdomen and pelvis was performed following the standard protocol without IV contrast. RADIATION DOSE REDUCTION: This exam was performed according to the departmental dose-optimization  program which includes automated exposure control, adjustment of the mA and/or kV according to patient size and/or use of iterative reconstruction technique. COMPARISON:  CT scan abdomen and pelvis from 09/01/2023. FINDINGS: CT CHEST FINDINGS Cardiovascular: Mild cardiomegaly. No pericardial effusion. No aortic aneurysm. There are coronary artery calcifications, in keeping with coronary artery disease. There are also mild peripheral atherosclerotic vascular calcifications of thoracic aorta and its major branches. Mediastinum/Nodes: Visualized thyroid gland appears grossly unremarkable. No solid / cystic mediastinal masses. The esophagus is nondistended precluding optimal assessment. No axillary, mediastinal or hilar lymphadenopathy by size criteria. Lungs/Pleura: The central tracheo-bronchial tree is patent. There are occlusive and nonocclusive filling defects in the distal subsegmental bronchi in the bilateral lower lobes, likely due to mucous/secretion or aspiration. There are dependent changes in bilateral lungs. There are patchy areas of linear, plate-like atelectasis and/or scarring throughout bilateral lungs. No mass or consolidation. No pleural effusion or pneumothorax. No suspicious lung nodules. There are multiple sub 5 mm scattered calcified granulomas throughout bilateral lungs. Musculoskeletal: The visualized soft tissues of the chest wall are grossly unremarkable. No suspicious osseous lesions. There are mild to moderate multilevel degenerative changes in the visualized spine. CT ABDOMEN PELVIS FINDINGS Hepatobiliary: The liver is normal in size. Non-cirrhotic configuration. No suspicious mass. No intrahepatic or extrahepatic bile duct dilation. Small volume dependent tiny calcified gallstones/sludge noted without imaging signs of acute cholecystitis. Normal gallbladder wall thickness. No pericholecystic inflammatory changes. Pancreas: Unremarkable. No pancreatic ductal dilatation or surrounding  inflammatory changes. Spleen: Within normal limits. No focal lesion. Adrenals/Urinary Tract: Adrenal glands are unremarkable. No suspicious renal mass. There is an approximately 1.6 x 1.7 cm cortical cyst arising from the left kidney interpolar region, posteromedially. No hydronephrosis. No renal or ureteric calculi. Unremarkable urinary bladder. Stomach/Bowel: No disproportionate dilation of the small or large bowel loops. No evidence of abnormal bowel wall thickening or inflammatory changes. The appendix is unremarkable. There are multiple diverticula mainly in the sigmoid colon, without imaging signs of diverticulitis. Vascular/Lymphatic: No ascites or pneumoperitoneum. No abdominal or pelvic lymphadenopathy, by size criteria. No aneurysmal dilation of the major abdominal arteries. There are moderate peripheral atherosclerotic vascular calcifications of the aorta and its major branches. Bilateral external/common femoral arterial vascular stents noted. Reproductive: Enlarged prostate. Symmetric seminal vesicles. Other: There is a tiny fat containing umbilical hernia. The soft tissues and abdominal wall are otherwise unremarkable. Musculoskeletal: No suspicious osseous lesions. There are mild - moderate multilevel degenerative changes in the visualized spine. IMPRESSION: 1. No acute traumatic injury to the chest, abdomen or pelvis. 2. Multiple other nonacute observations, as described above. Electronically Signed   By: Beula Brunswick  M.D.   On: 04/09/2024 14:50    Procedures Procedures    Medications Ordered in ED Medications  acetaminophen  (TYLENOL ) tablet 650 mg (650 mg Oral Not Given 04/09/24 1633)  lactated ringers bolus 500 mL (has no administration in time range)    ED Course/ Medical Decision Making/ A&P                                 Medical Decision Making Amount and/or Complexity of Data Reviewed Labs: ordered. Radiology: ordered.  Risk OTC drugs.   This patient presents to the  ED for concern of fall, this involves an extensive number of treatment options, and is a complaint that carries with it a high risk of complications and morbidity.  The differential diagnosis includes acute injuries   Co morbidities that complicate the patient evaluation  HTN, DM, CKD, OSA, aortic stenosis, CAD, carotid artery stenosis, anxiety, depression, HLD, anemia, CHF, mild cognitive impairment   Additional history obtained:  Additional history obtained from EMS, patient's wife External records from outside source obtained and reviewed including EMR   Lab Tests:  I Ordered, and personally interpreted labs.  The pertinent results include: Glycemia without evidence of DKA, baseline CKD, normal electrolytes, normal hemoglobin, no leukocytosis   Imaging Studies ordered:  I ordered imaging studies including chest and pelvis; CT scan of head, cervical spine, chest, abdomen, pelvis, T-spine, L-spine I independently visualized and interpreted imaging which showed no acute findings I agree with the radiologist interpretation   Cardiac Monitoring: / EKG:  The patient was maintained on a cardiac monitor.  I personally viewed and interpreted the cardiac monitored which showed an underlying rhythm of: Sinus rhythm   Problem List / ED Course / Critical interventions / Medication management  Patient presents after mechanical fall.  He is on Eliquis .  He arrives as a level 2 trauma.  On arrival, patient has alert and oriented.  He states that he struck the top/back of his head when he fell off of the for step.  No skin breakage or hematomas appreciated on exam.  Patient has no focal neurologic deficits.  He has some mild right shoulder pain which he states preceded his fall.  Range of motion of right shoulder is preserved.  He has no chest or abdominal tenderness.  He does have some mild pain in his back which she does attribute to the fall.  Tylenol  was ordered for analgesia.  Workup was  initiated.  Patient's lab work notable for hyperglycemia.  IV fluids were ordered.  Imaging studies did not show any acute findings.  On reassessment, patient resting comfortably.  He was able to stand and take a few steps without difficulty.  At baseline, he does ambulate with a walker.  Patient was discharged in stable condition. I ordered medication including Tylenol  for analgesia Reevaluation of the patient after these medicines showed that the patient improved I have reviewed the patients home medicines and have made adjustments as needed  Social Determinants of Health:  Lives at home with family        Final Clinical Impression(s) / ED Diagnoses Final diagnoses:  Fall, initial encounter    Rx / DC Orders ED Discharge Orders     None         Iva Mariner, MD 04/09/24 1654

## 2024-04-09 NOTE — ED Notes (Signed)
 Pt understood d/c instructions and when to return to the ED. IV taken out and VS retaken. Pt pushed out to the lobby where wife is going to take him home

## 2024-04-09 NOTE — Discharge Instructions (Signed)
 Your test results today were reassuring.  Take Tylenol  as needed for pain and soreness.  Return to the emergency department for any new or worsening symptoms of concern.

## 2024-04-09 NOTE — ED Triage Notes (Signed)
 Pt BIBA from w/ a mechanical fall on blood thinners. Pt did fall backwards abd hit back of his head but denies LOC. Fall was unwitnessed but wife heard pt fall and called 911. GCS 15. No other injuries noted. Pt is speaking full sentences and states a 2/10 pain in the back of the head. Medium size hematoma w/o opening of the skin noted to the back of the head

## 2024-04-16 MED ORDER — FUROSEMIDE 40 MG PO TABS: 40.0000 mg | ORAL_TABLET | Freq: Every day | ORAL | 1 refills | Status: DC

## 2024-04-16 NOTE — Telephone Encounter (Signed)
 Patient has request refill on medication that hasn't been refilled by PCP [name], [name], NP. Medication pend and sent to PCP.

## 2024-04-17 ENCOUNTER — Ambulatory Visit: Payer: Medicare Other | Admitting: Podiatry

## 2024-04-22 MED ORDER — BD PEN NEEDLE MINI U/F 31G X 5 MM MISC: 1 refills | Status: AC

## 2024-04-24 ENCOUNTER — Encounter: Payer: Self-pay | Admitting: Podiatry

## 2024-04-24 ENCOUNTER — Ambulatory Visit: Payer: Medicare Other | Admitting: Podiatry

## 2024-04-24 DIAGNOSIS — L84 Corns and callosities: Secondary | ICD-10-CM

## 2024-04-24 DIAGNOSIS — B351 Tinea unguium: Secondary | ICD-10-CM | POA: Diagnosis not present

## 2024-04-24 DIAGNOSIS — E1151 Type 2 diabetes mellitus with diabetic peripheral angiopathy without gangrene: Secondary | ICD-10-CM

## 2024-04-24 DIAGNOSIS — M79675 Pain in left toe(s): Secondary | ICD-10-CM

## 2024-04-24 DIAGNOSIS — M79674 Pain in right toe(s): Secondary | ICD-10-CM

## 2024-04-24 NOTE — Progress Notes (Signed)
 Subjective:  [name]: George Boyer, male    DOB: 1951/09/12,  MRN: [id number]  George Boyer presents to clinic today for at risk foot care. Pt has h/o NIDDM with PAD and callus(es) right lower extremity and painful mycotic toenails that are difficult to trim. Painful toenails interfere with ambulation. Aggravating factors include wearing enclosed shoe gear. Pain is relieved with periodic professional debridement. Painful calluses are aggravated when weightbearing with and without shoegear. Pain is relieved with periodic professional debridement. Patient is accompanied by his wife on today's visit. Wife states patient has had a fall since his last visit. He is seeing Neurology. Chief Complaint  Patient presents with   [facility]    Rm16/Dfc/ blood sugar 129/ A1c 8.9/ Last pcp visit March 14 2024 with Dr Arlis Lakes   New problem(s): None.   PCP is [name], Elijio Guadeloupe, NP.  Allergies  Allergen Reactions   Sunflower Oil Anaphylaxis    Swelling of throat Swelling of throat Swelling of throat-any sunflower product Swelling of throat-any sunflower product    Bupropion  Other (See Comments)    Tremors   Glucophage [Metformin Hcl] Diarrhea   Ozempic (0.25 Or 0.5 Mg-Dose) [Semaglutide(0.25 Or 0.5mg -Dos)]    Semaglutide Other (See Comments)    Review of Systems: Negative except as noted in the HPI.  Objective: No changes noted in today's physical examination. There were no vitals filed for this visit. George Boyer is a pleasant 73 y.o. male obese in NAD. AAO x 3.  Vascular Examination: CFT less than 3 seconds. DP pulses palpable b/l. PT pulses nonpalpable b/l. Digital hair absent. Skin temperature gradient warm to warn b/l. No ischemia or gangrene. No cyanosis or clubbing noted b/l. Trace edema noted BLE.   Neurological Examination: Sensation grossly intact b/l with 10 gram monofilament. Tremors b/l feet.  Dermatological Examination: No open wounds. No interdigital macerations.   Toenails  1-5 b/l thick, discolored, elongated with subungual debris and pain on dorsal palpation.   Minimal hyperkeratos(is/es) noted submet head 1 right foot.  Musculoskeletal Examination: HAV with bunion deformity noted b/l LE. Utilizes transport chair for ambulation assistance.  Radiographs: None  Last A1c:      Latest Ref Rng & Units 03/14/2024   12:00 PM 12/14/2023    2:25 PM  Hemoglobin A1C  Hemoglobin-A1c <5.7 % 8.3  8.9    Assessment/Plan: 1. Pain due to onychomycosis of toenails of both feet   2. Type II diabetes mellitus with peripheral circulatory disorder [facility])     Consent given for treatment. Patient examined. All patient's and/or POA's questions/concerns addressed on today's visit. Mycotic toenails 1-5 debrided in length and girth without incident. Continue foot and shoe inspections daily. Monitor blood glucose per PCP/Endocrinologist's recommendations.Continue soft, supportive shoe gear daily. Report any pedal injuries to medical professional. Call office if there are any quesitons/concerns. -Patient/POA to call should there be question/concern in the interim.   Return in about 3 months (around 07/25/2024).  Luella Sager, DPM      [place] LOCATION: [address] [place] [address]                   Office [phone]   [place] LOCATION: [phone]  [place], [place] [address] Office [phone]

## 2024-05-04 ENCOUNTER — Other Ambulatory Visit: Payer: Self-pay | Admitting: Adult Health

## 2024-05-06 NOTE — Telephone Encounter (Signed)
 Pharmacy requested refill.  Pended Rx and sent to [facility] for approval due to HIGH ALERT Warning.

## 2024-05-31 ENCOUNTER — Ambulatory Visit: Payer: Self-pay

## 2024-05-31 NOTE — Telephone Encounter (Signed)
-    Patient can be evaluated in the clinic, pls schedule for appointment.

## 2024-05-31 NOTE — Telephone Encounter (Signed)
Message routed to covering provider

## 2024-05-31 NOTE — Telephone Encounter (Signed)
 FYI Only or Action Required?: FYI only for provider.  Patient was last seen in primary care on 03/14/2024 by [name], Roxan BROCKS, NP.  Called Nurse Triage reporting Memory Loss.  Symptoms began several weeks ago.  Interventions attempted: Nothing.  Symptoms are: rapidly worsening.  Triage Disposition: No disposition on file.  Patient/caregiver understands and will follow disposition?:     Copied from CRM [id number]. Topic: Clinical - Red Word Triage >> May 31, 2024  2:21 PM Miquel SAILOR wrote: Red Word that prompted transfer to Nurse Triage: Having issues with recognozing his wife/Form of dementia. Hit his head on 05/20. Getting worse  since May Reason for Disposition  New or worsening falling    Increased confusion  Answer Assessment - Initial Assessment Questions 1. MAIN CONCERN OR SYMPTOM:  What is your main concern right now? What questions do you have? What's the main symptom you're worried about? (e.g., confusion, memory loss)     Memory confusion, loss 2. ONSET:  When did the symptom start (or worsen)? (minutes, hours, days, weeks)     04/09/24 -fell hit his head and worsening ever since 3. BETTER-SAME-WORSE: Are you (the patient) getting better, staying the same, or getting worse compared to the day you (they) were diagnosed or most recent hospital discharge?     Getting worse - patient unable to recall words 4. DIAGNOSIS: Was the dementia diagnosed by a doctor? If Yes, ask: When? (e.g., days, months, years ago)     yes 5. MEDICINES: Has there been any change in medicines recently? (e.g., narcotics, antihistamines, benzodiazepines, etc.)     no 6. OTHER SYMPTOMS: Are there any other symptoms? (e.g., cough, falling, fever, pain)     no 7. SUPPORT: What type of support do you (the patient) have? Note: Document living circumstances and support (e.g., family, nursing home).     Family   Pt wife stated pt has a form of dementia and it is getting. Pt reports  increased confusion, not being able to recall names of things, etc.  Protocols used: Dementia Symptoms and Questions-A-AH

## 2024-06-03 NOTE — Telephone Encounter (Signed)
Mychart message sent to patient with providers response.

## 2024-06-05 ENCOUNTER — Encounter: Payer: Self-pay | Admitting: Adult Health

## 2024-06-05 ENCOUNTER — Ambulatory Visit (INDEPENDENT_AMBULATORY_CARE_PROVIDER_SITE_OTHER): Admitting: Adult Health

## 2024-06-05 VITALS — BP 130/86 | HR 60 | Temp 98.6°F | Ht 68.0 in | Wt 199.8 lb

## 2024-06-05 DIAGNOSIS — H1013 Acute atopic conjunctivitis, bilateral: Secondary | ICD-10-CM

## 2024-06-05 DIAGNOSIS — F5101 Primary insomnia: Secondary | ICD-10-CM

## 2024-06-05 DIAGNOSIS — F0390 Unspecified dementia without behavioral disturbance: Secondary | ICD-10-CM | POA: Diagnosis not present

## 2024-06-05 DIAGNOSIS — E1142 Type 2 diabetes mellitus with diabetic polyneuropathy: Secondary | ICD-10-CM | POA: Diagnosis not present

## 2024-06-05 MED ORDER — OLOPATADINE HCL 0.1 % OP SOLN: 1.0000 [drp] | Freq: Two times a day (BID) | OPHTHALMIC | 1 refills | Status: AC | PRN

## 2024-06-05 MED ORDER — HYDROXYZINE PAMOATE 25 MG PO CAPS: 25.0000 mg | ORAL_CAPSULE | Freq: Every day | ORAL | 1 refills | Status: DC

## 2024-06-05 NOTE — Progress Notes (Signed)
 Location:  [facility]  POS: Clinic  Provider: Tawni America, ANP  Goals of Care:     04/09/2024    1:45 PM  Advanced Directives  Does Patient Have a Medical Advance Directive? No  Would patient like information on creating a medical advance directive? No - Guardian declined     Chief Complaint  Patient presents with   Acute Visit    Patients has concerns pain in his head located behind his eyes..Sleep all day and has sundown's    HPI: Discussed the use of AI scribe software for clinical note transcription with the patient, who gave verbal consent to proceed.  History of Present Illness   George Boyer is a 73 year old male with severe cognitive impairment who presents with a a  headache. He is accompanied by his wife, who is his primary caregiver.  He experienced a severe headache last night that woke him from sleep, with intense pain making it difficult to get out of bed. The headache was on the sides and top of his head. After taking Tylenol , the headache improved to a medium level but has not completely resolved. No changes in vision, nausea, or dizziness. There is no recent history of falls, although he did fall on May 20th, hitting the back of his head on concrete, which required a hospital visit and CT scans. According to his wife, he was told there was no bleeding in the brain.  He has a history of dementia, which has worsened since his fall in May. His wife reports nocturnal restlessness and confusion, with attempts to leave the house. He was previously on multiple medications, but many have been stopped due to side effects and a shift towards palliative care. He continues to take insulin  (Toujeo ) for diabetes management. He was prescribed Zoloft in June but never started it. He takes Klonopin  and trazodone at bedtime, but they do not effectively help him sleep.  He has a history of fluid overload, for which he was hospitalized in October and treated with furosemide ,  resulting in a 30-pound weight loss. He experienced tremors and frequent falls from October to February, which have since resolved. He is no longer on Lasix  and has not experienced significant fluid retention since stopping it.  His eyes are red and irritated, and he frequently rubs them. He uses a spray for his eyelids and lashes prescribed by an ophthalmologist, but it is not for the inside of the eyes. His wife suspects allergies may be contributing to his symptoms. He reports not drinking enough water, which may be affecting his headaches.  His blood sugar was 284 after eating cereal and grapes, but no low blood sugar was noted during the headache event. He has a history of elevated creatinine, which has led to adjustments in his medication regimen.        His wife is requesting an FL2 form She is favoring more palliative care Past Medical History:  Diagnosis Date   Anxiety    Asthma    Atrial fibrillation (HCC)    Cellulitis of right hand 06/04/2020   CHF (congestive heart failure) (HCC)    Chronic kidney disease (CKD)    Colon polyps    Coronary artery disease    Diabetes mellitus without complication (HCC)    Diastolic heart failure (HCC)    Gallstones    HLD (hyperlipidemia)    Hypertension    IDA (iron  deficiency anemia)    Iron  deficiency anemia 01/2024  Obesity    Pneumonia    Sleep apnea treated with continuous positive airway pressure (CPAP)     Past Surgical History:  Procedure Laterality Date   COLONOSCOPY     CORONARY STENT PLACEMENT     TONSILLECTOMY     UPPER GASTROINTESTINAL ENDOSCOPY      Allergies  Allergen Reactions   Sunflower Oil Anaphylaxis    Swelling of throat Swelling of throat Swelling of throat-any sunflower product Swelling of throat-any sunflower product    Bupropion  Other (See Comments)    Tremors   Glucophage [Metformin Hcl] Diarrhea   Ozempic (0.25 Or 0.5 Mg-Dose) [Semaglutide(0.25 Or 0.5mg -Dos)]    Semaglutide Other (See  Comments)    Outpatient Encounter Medications as of 06/05/2024  Medication Sig   ACCU-CHEK AVIVA PLUS test strip USE TO TEST BLOOD SUGARS FOUR TIMES DAILY DX E11.9 AND Z79.4   Accu-Chek FastClix Lancets MISC Use to monitor blood glucose 4 time(s) daily DX CODE E11.9 and Z79.4   B-D UF III MINI PEN NEEDLES 31G X 5 MM MISC Use to test Blood Sugar three time daily Dx: E11.42   blood glucose meter kit and supplies KIT Dispense based on patient and insurance preference. Use up to four times daily as directed.Accu check Dx:E11.42   Blood Glucose Monitoring Suppl (ACCU-CHEK AVIVA PLUS) w/Device KIT Use to test blood sugar four times daily. Dx: E11.42   clonazePAM  (KLONOPIN ) 0.5 MG tablet Take 1 tablet (0.5 mg total) by mouth at bedtime.   hydrOXYzine  (VISTARIL ) 25 MG capsule Take 1 capsule (25 mg total) by mouth at bedtime.   Insulin  Syringe-Needle U-100 (INSULIN  SYRINGE 1CC/31GX5/16) 31G X 5/16 1 ML MISC Use with insulin  five (5) times daily  DX E11.42 and Z79.4   nitroGLYCERIN  (NITROSTAT ) 0.4 MG SL tablet Place 1 tablet (0.4 mg total) under the tongue every 5 (five) minutes as needed for chest pain.   olopatadine  (PATADAY ) 0.1 % ophthalmic solution Place 1 drop into both eyes 2 (two) times daily as needed for allergies.   TOUJEO  SOLOSTAR 300 UNIT/ML Solostar Pen INJECT 25 UNITS INTO THE SKIN IN THE MORNING AND AT BEDTIME.   Vitamin D , Ergocalciferol , (DRISDOL ) 1.25 MG (50000 UNIT) CAPS capsule TAKE 1 CAPSULE BY MOUTH ONCE A WEEK   [DISCONTINUED] traZODone (DESYREL) 100 MG tablet Take 100 mg by mouth at bedtime.   [DISCONTINUED] apixaban  (ELIQUIS ) 5 MG TABS tablet Take 5 mg by mouth 2 (two) times daily.   [DISCONTINUED] atorvastatin  (LIPITOR) 10 MG tablet Take 1 tablet (10 mg total) by mouth daily. (Patient not taking: Reported on 04/09/2024)   [DISCONTINUED] furosemide  (LASIX ) 40 MG tablet Take 1 tablet (40 mg total) by mouth daily.   [DISCONTINUED] pravastatin  (PRAVACHOL ) 20 MG tablet Take 20 mg by  mouth every evening.   [DISCONTINUED] ranolazine  (RANEXA ) 500 MG 12 hr tablet Take 500 mg by mouth 2 (two) times daily.   No facility-administered encounter medications on file as of 06/05/2024.    Review of Systems:  Review of Systems  Constitutional:  Positive for activity change (weakner over time.). Negative for appetite change, chills, diaphoresis, fatigue, fever and unexpected weight change.  HENT:  Negative for congestion, ear pain, facial swelling, postnasal drip, rhinorrhea, sinus pressure, sneezing and sore throat.   Eyes:  Positive for redness and itching. Negative for photophobia, pain, discharge and visual disturbance.  Respiratory:  Negative for cough, shortness of breath, wheezing and stridor.   Cardiovascular:  Negative for chest pain, palpitations and leg swelling.  Gastrointestinal:  Negative for abdominal distention, abdominal pain, constipation and diarrhea.  Genitourinary:  Negative for difficulty urinating, dysuria, flank pain and hematuria.  Musculoskeletal:  Positive for gait problem. Negative for arthralgias, back pain, joint swelling and myalgias.  Neurological:  Positive for headaches. Negative for dizziness, seizures, syncope, facial asymmetry, speech difficulty, weakness, light-headedness and numbness.  Hematological:  Negative for adenopathy. Does not bruise/bleed easily.  Psychiatric/Behavioral:  Positive for agitation, behavioral problems, confusion and sleep disturbance.     Health Maintenance  Topic Date Due   COVID-19 Vaccine (3 - Pfizer risk series) 01/28/2020   Medicare Annual Wellness (AWV)  03/12/2024   OPHTHALMOLOGY EXAM  04/21/2024   Colonoscopy  10/02/2024   Zoster Vaccines- Shingrix (1 of 2) 06/13/2024 (Originally 12/06/1969)   DTaP/Tdap/Td (1 - Tdap) 03/14/2025 (Originally 12/06/1969)   INFLUENZA VACCINE  06/21/2024   HEMOGLOBIN A1C  09/13/2024   FOOT EXAM  10/09/2024   Diabetic kidney evaluation - Urine ACR  12/13/2024   Diabetic kidney  evaluation - eGFR measurement  04/09/2025   Pneumococcal Vaccine: 50+ Years  Completed   Hepatitis C Screening  Completed   Hepatitis B Vaccines  Aged Out   HPV VACCINES  Aged Out   Meningococcal B Vaccine  Aged Out    Physical Exam: Vitals:   06/05/24 1404  BP: 130/86  Pulse: 60  Temp: 98.6 F (37 C)  SpO2: 95%  Weight: 199 lb 12.8 oz (90.6 kg)  Height: 5' 8 (1.727 m)   Body mass index is 30.38 kg/m. Wt Readings from Last 3 Encounters:  06/05/24 199 lb 12.8 oz (90.6 kg)  04/09/24 200 lb (90.7 kg)  03/14/24 207 lb 12.8 oz (94.3 kg)     Physical Exam Vitals and nursing note reviewed.  Constitutional:      Appearance: Normal appearance.  HENT:     Head: Normocephalic and atraumatic.     Right Ear: Tympanic membrane normal.     Left Ear: Tympanic membrane normal.     Nose: Nose normal.     Mouth/Throat:     Mouth: Mucous membranes are moist.     Pharynx: Oropharynx is clear.  Eyes:     General:        Right eye: No discharge.        Left eye: No discharge.     Conjunctiva/sclera: Conjunctivae normal.     Pupils: Pupils are equal, round, and reactive to light.     Comments: Erythema to upper and lower lids and conjunctiva No drainage Sclera normal  Cardiovascular:     Rate and Rhythm: Normal rate and regular rhythm.     Heart sounds: No murmur heard. Pulmonary:     Effort: Pulmonary effort is normal. No respiratory distress.     Breath sounds: Normal breath sounds. No wheezing.  Abdominal:     General: Bowel sounds are normal. There is no distension.     Palpations: Abdomen is soft.     Tenderness: There is no abdominal tenderness.  Musculoskeletal:     Cervical back: No rigidity.     Right lower leg: No edema.     Left lower leg: No edema.  Lymphadenopathy:     Cervical: No cervical adenopathy.  Skin:    General: Skin is warm and dry.  Neurological:     General: No focal deficit present.     Mental Status: He is alert. Mental status is at baseline.      Cranial Nerves: No cranial nerve deficit.  Psychiatric:  Mood and Affect: Mood normal.     Labs reviewed: Basic Metabolic Panel: Recent Labs    09/01/23 1223 09/01/23 1227 09/02/23 0314 09/03/23 0356 12/14/23 1425 03/14/24 1200 04/09/24 1315 04/09/24 1341  NA  --   --  139   < > 139 139 136 137  K  --   --  4.7   < > 4.4 4.6 4.2 4.2  CL  --   --  107   < > 102 105 102 100  CO2  --   --  25   < > 29 25 26   --   GLUCOSE  --   --  98   < > 195* 257* 339* 340*  BUN  --   --  38*   < > 27* 27* 29* 30*  CREATININE  --   --  2.55*   < > 2.14* 1.56* 1.89* 2.00*  CALCIUM   --   --  8.7*   < > 9.1 9.1 8.6*  --   MG 2.3  --  2.3  --   --   --  1.9  --   TSH  --  1.218  --   --  0.76 0.62  --   --    < > = values in this interval not displayed.   Liver Function Tests: Recent Labs    09/02/23 0314 09/03/23 0356 12/14/23 1425 03/14/24 1200 04/09/24 1315  AST 12* 16 12 11 17   ALT 17 19 13 9 14   ALKPHOS 63 71  --   --  59  BILITOT 0.8 1.0 0.7 0.5 0.8  PROT 6.1* 7.1 6.6 6.0* 5.8*  ALBUMIN 2.7* 3.2*  --   --  2.6*   Recent Labs    09/01/23 1223  LIPASE 12   No results for input(s): AMMONIA in the last 8760 hours. CBC: Recent Labs    11/23/23 1117 12/14/23 1425 03/14/24 1200 04/09/24 1315 04/09/24 1341  WBC 6.3 7.4 6.4 6.8  --   NEUTROABS 3.7 3,885 3,565  --   --   HGB 10.7* 10.7* 13.2 11.9* 11.9*  HCT 33.0* 33.5* 40.6 35.2* 35.0*  MCV 84.5 87.9 92.5 91.0  --   PLT 309.0 349 257 213  --    Lipid Panel: Recent Labs    12/14/23 1425 03/14/24 1200  CHOL 185 214*  HDL 52 47  LDLCALC 110* 141*  TRIG 121 140  CHOLHDL 3.6 4.6   Lab Results  Component Value Date   HGBA1C 8.3 (H) 03/14/2024    Procedures since last visit: No results found.  Assessment/Plan     Headache Acute severe headache, now improving after tylenol . Differential includes tension headache, allergic conjunctivitis, bp or glucose issue. Palliative care approach prioritizes  symptom monitoring. Previous CT neg. Taken off eliquis , and statin due to goals of care per wife. Discussed with his wife. Will treat with tylenol  and vistaril  for allergic conjunctivitis and behaviors/sleep issues.  - Monitor symptoms, use acetaminophen  for pain. -BP controlled -Normal neuro exam  - Encourage hydration.  Dementia Progressive cognitive decline post-fall. Management is palliative, focusing on comfort and quality of life. -has seen neurlogy  - wife is focusing on palliative care -considering nursing admit Wants quantiferon gold Filled out FL2 - Consider Vistaril  25 mg at night for sleep and eye irritation, increase to 50 mg if needed. Discontinue trazodone if effective.  Allergic Conjunctivitis Redness and irritation likely due to allergies. Current treatment includes eyelid spray. - add vistaril  -  pataday  prn  Insomnia D/c trazodone Try vistaril  also on clonazepam   Diabetes Mellitus Diabetes managed with insulin . Recent blood glucose 284 mg/dL postprandial. - Order J8r test during next blood draw for Quantiferon test.  Chronic Kidney Disease Continue to periodically monitor BMP and avoid nephrotoxic agents   Goals of Care Family opted for palliative approach, focusing on comfort and quality of life. - Document palliative care approach in medical records.  Follow-up Follow-up needed to assess response to treatment and symptoms. Virtual preferred due to mobility challenges. - Schedule virtual follow-up in two weeks for sleep and eye symptoms. - Maintain routine follow-up in October with Doyal.          Labs/tests ordered:  * No order type specified *A1C TB screen Next appt:  09/13/2024   Total time :  time greater than 50% of total time spent doing pt counseling and coordination of care

## 2024-06-06 ENCOUNTER — Ambulatory Visit: Payer: Self-pay | Admitting: Adult Health

## 2024-06-08 LAB — QUANTIFERON-TB GOLD PLUS
Mitogen-NIL: 9.97 [IU]/mL
NIL: 0.02 [IU]/mL
QuantiFERON-TB Gold Plus: NEGATIVE
TB1-NIL: 0 [IU]/mL
TB2-NIL: 0 [IU]/mL

## 2024-06-08 LAB — HEMOGLOBIN A1C
Hgb A1c MFr Bld: 8.3 % — ABNORMAL HIGH (ref <5.7)
Mean Plasma Glucose: 192 mg/dL
eAG (mmol/L): 10.6 mmol/L

## 2024-06-12 ENCOUNTER — Other Ambulatory Visit: Payer: Self-pay | Admitting: Family

## 2024-06-12 DIAGNOSIS — F411 Generalized anxiety disorder: Secondary | ICD-10-CM

## 2024-06-12 NOTE — Telephone Encounter (Signed)
 Refer to chart review- Misc Reports- Chart review:Routing History for documentation or electronic fax/routing info

## 2024-06-12 NOTE — Telephone Encounter (Signed)
 Patient has request for refill on 06/12/2024. Patient last refill was 03/14/24. Patient has contract on file dated 03/21/2024. Patient has upcoming appointment 06/19/2024. Updated contract/Sign contract was added to appointment notes. Medication pend and sent to PCP ([name], [name], NP) for approval.

## 2024-06-13 ENCOUNTER — Ambulatory Visit: Payer: Medicare Other | Admitting: Family

## 2024-06-19 ENCOUNTER — Ambulatory Visit (INDEPENDENT_AMBULATORY_CARE_PROVIDER_SITE_OTHER): Admitting: Family

## 2024-06-19 ENCOUNTER — Encounter: Payer: Self-pay | Admitting: Family

## 2024-06-19 VITALS — BP 114/70 | HR 50 | Temp 97.8°F | Ht 68.0 in | Wt 202.4 lb

## 2024-06-19 DIAGNOSIS — N183 Chronic kidney disease, stage 3 unspecified: Secondary | ICD-10-CM

## 2024-06-19 DIAGNOSIS — E1142 Type 2 diabetes mellitus with diabetic polyneuropathy: Secondary | ICD-10-CM

## 2024-06-19 DIAGNOSIS — R2681 Unsteadiness on feet: Secondary | ICD-10-CM

## 2024-06-19 DIAGNOSIS — G8929 Other chronic pain: Secondary | ICD-10-CM

## 2024-06-19 DIAGNOSIS — M5442 Lumbago with sciatica, left side: Secondary | ICD-10-CM

## 2024-06-19 DIAGNOSIS — M25562 Pain in left knee: Secondary | ICD-10-CM

## 2024-06-19 DIAGNOSIS — E785 Hyperlipidemia, unspecified: Secondary | ICD-10-CM | POA: Diagnosis not present

## 2024-06-19 DIAGNOSIS — G4733 Obstructive sleep apnea (adult) (pediatric): Secondary | ICD-10-CM

## 2024-06-19 DIAGNOSIS — I2510 Atherosclerotic heart disease of native coronary artery without angina pectoris: Secondary | ICD-10-CM

## 2024-06-19 DIAGNOSIS — I5032 Chronic diastolic (congestive) heart failure: Secondary | ICD-10-CM

## 2024-06-19 DIAGNOSIS — I10 Essential (primary) hypertension: Secondary | ICD-10-CM

## 2024-06-19 DIAGNOSIS — E1122 Type 2 diabetes mellitus with diabetic chronic kidney disease: Secondary | ICD-10-CM

## 2024-06-19 DIAGNOSIS — F5101 Primary insomnia: Secondary | ICD-10-CM | POA: Diagnosis not present

## 2024-06-19 DIAGNOSIS — F03918 Unspecified dementia, unspecified severity, with other behavioral disturbance: Secondary | ICD-10-CM | POA: Diagnosis not present

## 2024-06-19 NOTE — Progress Notes (Signed)
 Provider: Roxan Plough FNP-C   Johnanna Bakke, Roxan BROCKS, NP  Patient Care Team: Tyronda Vizcarrondo, Roxan BROCKS, NP as PCP - General (Family Medicine) Gaynel Delon CROME, DPM as Consulting Physician (Podiatry) Cleotilde Elspeth CROME, OD [facility])  Extended Emergency Contact Information Primary Emergency Contact: [facility] Address: [address] [place], [place] [address] [place] Home Phone: [phone] Mobile Phone: [phone] Relation: Spouse  Code Status:  Full Code  Goals of care: Advanced Directive information    06/19/2024    3:04 PM  Advanced Directives  Does Patient Have a Medical Advance Directive? Yes  Type of Estate agent of [place];Living will  Does patient want to make changes to medical advance directive? No - Patient declined  Copy of Healthcare Power of Attorney in Chart? Yes - validated most recent copy scanned in chart (See row information)     Chief Complaint  Patient presents with   Medical Management of Chronic Issues    2 week follow up. Patient's wife, Ronal, wants to talk to you about some forms as well. Discuss care gaps.      Discussed the use of AI scribe software for clinical note transcription with the patient, who gave verbal consent to proceed.  History of Present Illness   George Boyer is a 73 year old male with dementia and diabetes who presents for follow-up after a fall and cognitive decline.  He experienced a fall on May 20th, hitting his head on a concrete patio, which led to a 911 call and subsequent hospitalization. Since the fall, there has been a significant cognitive decline. He frequently wakes up every couple of hours at night, unable to return to sleep, and has become combative, not recognizing familiar people, and experiencing confusion about time and day.  He has issues with sleep, waking up multiple times during the night to eat cereal, which is his primary food intake. He has difficulty  with meat but can manage other foods. His nighttime combativeness has led to his caregiver sleeping in a separate room due to safety concerns.  He has a history of diabetes and is currently on palliative care, with most medications stopped except for Trimo. He was previously on clozapine for anxiety and trazodone, but these have not been effective. He is not using his CPAP machine, which he stopped using a couple of months ago.  He experiences pain in his right shoulder, lower back, and left leg, which keeps him up at night. He has been taking Tylenol  for pain management.  He underwent a blood test to screen for TB, which returned negative. He has not had a shingles vaccine. His last flu shot was on September 11, 2023, and he had pneumonia vaccines in 2022 and 2018.  He has fallen out of bed recently and almost fell during a shower, indicating a risk of falls.    Past Medical History:  Diagnosis Date   Anxiety    Asthma    Atrial fibrillation (HCC)    Cellulitis of right hand 06/04/2020   CHF (congestive heart failure) (HCC)    Chronic kidney disease (CKD)    Colon polyps    Coronary artery disease    Diabetes mellitus without complication (HCC)    Diastolic heart failure (HCC)    Gallstones    HLD (hyperlipidemia)    Hypertension    IDA (iron  deficiency anemia)    Iron  deficiency anemia 01/2024  Obesity    Pneumonia    Sleep apnea treated with continuous positive airway pressure (CPAP)    Past Surgical History:  Procedure Laterality Date   COLONOSCOPY     CORONARY STENT PLACEMENT     TONSILLECTOMY     UPPER GASTROINTESTINAL ENDOSCOPY      Allergies  Allergen Reactions   Sunflower Oil Anaphylaxis    Swelling of throat Swelling of throat Swelling of throat-any sunflower product Swelling of throat-any sunflower product    Bupropion  Other (See Comments)    Tremors   Glucophage [Metformin Hcl] Diarrhea   Ozempic (0.25 Or 0.5 Mg-Dose) [Semaglutide(0.25 Or 0.5mg -Dos)]     Semaglutide Other (See Comments)    Allergies as of 06/19/2024       Reactions   Sunflower Oil Anaphylaxis   Swelling of throat Swelling of throat Swelling of throat-any sunflower product Swelling of throat-any sunflower product   Bupropion  Other (See Comments)   Tremors   Glucophage [metformin Hcl] Diarrhea   Ozempic (0.25 Or 0.5 Mg-dose) [semaglutide(0.25 Or 0.5mg -dos)]    Semaglutide Other (See Comments)        Medication List        Accurate as of June 19, 2024  4:07 PM. If you have any questions, ask your nurse or doctor.          Accu-Chek Aviva Plus test strip Generic drug: glucose blood USE TO TEST BLOOD SUGARS FOUR TIMES DAILY DX E11.9 AND Z79.4   Accu-Chek Aviva Plus w/Device Kit Use to test blood sugar four times daily. Dx: E11.42   Accu-Chek FastClix Lancets Misc Use to monitor blood glucose 4 time(s) daily DX CODE E11.9 and Z79.4   B-D UF III MINI PEN NEEDLES 31G X 5 MM Misc Generic drug: Insulin  Pen Needle Use to test Blood Sugar three time daily Dx: E11.42   blood glucose meter kit and supplies Kit Dispense based on patient and insurance preference. Use up to four times daily as directed.Accu check Dx:E11.42   clonazePAM  0.5 MG tablet Commonly known as: KLONOPIN  TAKE 1 TABLET BY MOUTH AT BEDTIME.   hydrOXYzine  25 MG capsule Commonly known as: VISTARIL  Take 1 capsule (25 mg total) by mouth at bedtime.   INSULIN  SYRINGE 1CC/31GX5/16 31G X 5/16 1 ML Misc Use with insulin  five (5) times daily  DX E11.42 and Z79.4   nitroGLYCERIN  0.4 MG SL tablet Commonly known as: NITROSTAT  Place 1 tablet (0.4 mg total) under the tongue every 5 (five) minutes as needed for chest pain.   olopatadine  0.1 % ophthalmic solution Commonly known as: Pataday  Place 1 drop into both eyes 2 (two) times daily as needed for allergies.   Toujeo  SoloStar 300 UNIT/ML Solostar Pen Generic drug: insulin  glargine (1 Unit Dial) INJECT 25 UNITS INTO THE SKIN IN THE MORNING  AND AT BEDTIME.   Vitamin D  (Ergocalciferol ) 1.25 MG (50000 UNIT) Caps capsule Commonly known as: DRISDOL  TAKE 1 CAPSULE BY MOUTH ONE TIME PER WEEK        Review of Systems  Unable to perform ROS: Dementia (information provided by patient's wife)  Constitutional:  Negative for appetite change, chills, fatigue, fever and unexpected weight change.  HENT:  Negative for congestion, dental problem, ear discharge, ear pain, facial swelling, hearing loss, nosebleeds, postnasal drip, rhinorrhea, sinus pressure, sinus pain, sneezing, sore throat, tinnitus and trouble swallowing.   Eyes:  Negative for pain, discharge, redness, itching and visual disturbance.  Respiratory:  Negative for cough, chest tightness, shortness of breath and wheezing.  Cardiovascular:  Negative for chest pain, palpitations and leg swelling.  Gastrointestinal:  Negative for abdominal distention, abdominal pain, blood in stool, constipation, diarrhea, nausea and vomiting.  Endocrine: Negative for cold intolerance, heat intolerance, polydipsia, polyphagia and polyuria.  Genitourinary:  Negative for difficulty urinating, dysuria, flank pain, frequency and urgency.  Musculoskeletal:  Positive for arthralgias, back pain and gait problem. Negative for joint swelling, myalgias, neck pain and neck stiffness.  Skin:  Negative for color change, pallor, rash and wound.  Neurological:  Negative for dizziness, syncope, speech difficulty, weakness, light-headedness, numbness and headaches.  Hematological:  Does not bruise/bleed easily.  Psychiatric/Behavioral:  Positive for behavioral problems. Negative for agitation, confusion, hallucinations, self-injury, sleep disturbance and suicidal ideas. The patient is not nervous/anxious.        Memory loss     Immunization History  Administered Date(s) Administered   Fluad Quad(high Dose 65+) 08/13/2020   Fluad Trivalent(High Dose 65+) 09/11/2023   Influenza Inj Mdck Quad Pf 10/05/2021    Influenza,inj,quad, With Preservative 08/17/2018   Influenza-Unspecified 08/17/2018, 08/17/2018, 08/01/2019, 08/01/2019   PFIZER(Purple Top)SARS-COV-2 Vaccination 12/10/2019, 12/31/2019   PNEUMOCOCCAL CONJUGATE-20 10/05/2021   Pneumococcal Polysaccharide-23 07/09/2019   Pneumococcal-Unspecified 07/09/2019   Pertinent  Health Maintenance Due  Topic Date Due   OPHTHALMOLOGY EXAM  04/21/2024   Colonoscopy  10/02/2024   INFLUENZA VACCINE  06/21/2024   FOOT EXAM  10/09/2024   HEMOGLOBIN A1C  12/06/2024      01/16/2024    1:04 PM 01/17/2024   10:05 AM 01/24/2024    1:05 PM 03/14/2024   10:54 AM 06/05/2024    2:06 PM  Fall Risk  Falls in the past year? 1 1 1 1 1   Was there an injury with Fall? 1 1 1 1  0  Was there an injury with Fall? - Comments  sprang knee/ hit head once     Fall Risk Category Calculator 3 3 3 2 1   Patient at Risk for Falls Due to Impaired balance/gait;Impaired mobility  Impaired balance/gait History of fall(s) Impaired balance/gait  Fall risk Follow up Falls evaluation completed Falls evaluation completed Falls evaluation completed Falls evaluation completed Falls evaluation completed   Functional Status Survey:    Vitals:   06/19/24 1507  BP: 114/70  Pulse: (!) 50  Temp: 97.8 F (36.6 C)  SpO2: 99%  Weight: 202 lb 6.4 oz (91.8 kg)  Height: 5' 8 (1.727 m)   Body mass index is 30.77 kg/m. Physical Exam GENERAL: Alert, cooperative, well developed, no acute distress HEENT: Normocephalic, normal oropharynx, moist mucous membranes CHEST: Clear to auscultation bilaterally, no wheezes, rhonchi, or crackles CARDIOVASCULAR: Normal heart rate and rhythm, S1 and S2 normal without murmurs ABDOMEN: Soft, non-tender, non-distended, without organomegaly, normal bowel sounds EXTREMITIES: No cyanosis, edema, or swelling NEUROLOGICAL: Cranial nerves grossly intact, moves all extremities without gross motor or sensory deficit  SKIN: No rash,no lesion or erythema    PSYCHIATRY/BEHAVIORAL: Memory loss ,Mood stable    Labs reviewed: Recent Labs    09/01/23 1223 09/02/23 0314 09/03/23 0356 12/14/23 1425 03/14/24 1200 04/09/24 1315 04/09/24 1341  NA  --  139   < > 139 139 136 137  K  --  4.7   < > 4.4 4.6 4.2 4.2  CL  --  107   < > 102 105 102 100  CO2  --  25   < > 29 25 26   --   GLUCOSE  --  98   < > 195* 257* 339* 340*  BUN  --  38*   < > 27* 27* 29* 30*  CREATININE  --  2.55*   < > 2.14* 1.56* 1.89* 2.00*  CALCIUM   --  8.7*   < > 9.1 9.1 8.6*  --   MG 2.3 2.3  --   --   --  1.9  --    < > = values in this interval not displayed.   Recent Labs    09/02/23 0314 09/03/23 0356 12/14/23 1425 03/14/24 1200 04/09/24 1315  AST 12* 16 12 11 17   ALT 17 19 13 9 14   ALKPHOS 63 71  --   --  59  BILITOT 0.8 1.0 0.7 0.5 0.8  PROT 6.1* 7.1 6.6 6.0* 5.8*  ALBUMIN 2.7* 3.2*  --   --  2.6*   Recent Labs    11/23/23 1117 12/14/23 1425 03/14/24 1200 04/09/24 1315 04/09/24 1341  WBC 6.3 7.4 6.4 6.8  --   NEUTROABS 3.7 3,885 3,565  --   --   HGB 10.7* 10.7* 13.2 11.9* 11.9*  HCT 33.0* 33.5* 40.6 35.2* 35.0*  MCV 84.5 87.9 92.5 91.0  --   PLT 309.0 349 257 213  --    Lab Results  Component Value Date   TSH 0.62 03/14/2024   Lab Results  Component Value Date   HGBA1C 8.3 (H) 06/05/2024   Lab Results  Component Value Date   CHOL 214 (H) 03/14/2024   HDL 47 03/14/2024   LDLCALC 141 (H) 03/14/2024   TRIG 140 03/14/2024   CHOLHDL 4.6 03/14/2024    Significant Diagnostic Results in last 30 days:  No results found.  Assessment/Plan  Dementia with behavioral disturbance Dementia with behavioral disturbances, characterized by nocturnal agitation, confusion, and combative behavior. Recent cognitive decline noted after a fall with head injury. Differential diagnosis includes Lewy body dementia, but confirmation requires MRI of the brain. - Refer to hospice care for further management and support - Order MRI of the brain to evaluate  for Lewy body dementia - Encourage participation in memory care activities to support cognitive function  Insomnia and nocturnal agitation Insomnia and nocturnal agitation, with frequent waking and confusion at night. Previous trials of trazodone and antihistamines were ineffective. - Refer to memory care facility for 24-hour monitoring and support - Encourage participation in activities to improve sleep patterns  Fall with head injury Fall on May 20th resulting in head injury. Cognitive decline noted since the fall. - Ensure safety measures are in place to prevent future falls  Chronic pain (back, knee, right shoulder, buttock, left leg) Chronic pain reported in the back, knee, right shoulder, buttock, and left leg. Pain is affecting sleep quality. - Administer Tylenol  for pain management, especially at night to aid sleep - Instruct facility staff to provide pain management as needed  Obstructive sleep apnea, not using CPAP Obstructive sleep apnea, with non-compliance to CPAP use for several months. Potential impact on sleep quality and overall health. - Encourage resumption of CPAP use to improve sleep quality  Type 2 diabetes mellitus Type 2 diabetes mellitus, currently managed with a palliative approach. Dietary habits include frequent cereal consumption, which may affect blood sugar levels. - Encourage a balanced diet with vegetables to help manage blood sugar levels and improve bowel movements  Family/ staff Communication: Reviewed plan of care with patient and wife verbalized understanding   Labs/tests ordered: None   Next Appointment : Return if symptoms worsen or fail to improve.   Spent 30 minutes  of Face to face and non-face to face with patient  >50% time spent counseling; reviewing medical record; tests; labs; documentation and developing future plan of care.   Roxan JAYSON Plough, NP

## 2024-06-27 ENCOUNTER — Other Ambulatory Visit: Payer: Self-pay | Admitting: Adult Health

## 2024-06-27 DIAGNOSIS — H1013 Acute atopic conjunctivitis, bilateral: Secondary | ICD-10-CM

## 2024-07-06 ENCOUNTER — Emergency Department (HOSPITAL_COMMUNITY)

## 2024-07-06 ENCOUNTER — Encounter (HOSPITAL_COMMUNITY): Payer: Self-pay

## 2024-07-06 ENCOUNTER — Emergency Department (HOSPITAL_COMMUNITY)
Admission: EM | Admit: 2024-07-06 | Discharge: 2024-07-07 | Disposition: A | Discharge status: Home / Self Care | Attending: Emergency Medicine | Admitting: Emergency Medicine

## 2024-07-06 DIAGNOSIS — E1122 Type 2 diabetes mellitus with diabetic chronic kidney disease: Secondary | ICD-10-CM | POA: Insufficient documentation

## 2024-07-06 DIAGNOSIS — Y92009 Unspecified place in unspecified non-institutional (private) residence as the place of occurrence of the external cause: Secondary | ICD-10-CM | POA: Diagnosis not present

## 2024-07-06 DIAGNOSIS — I2510 Atherosclerotic heart disease of native coronary artery without angina pectoris: Secondary | ICD-10-CM | POA: Diagnosis not present

## 2024-07-06 DIAGNOSIS — M25521 Pain in right elbow: Secondary | ICD-10-CM

## 2024-07-06 DIAGNOSIS — S50311A Abrasion of right elbow, initial encounter: Secondary | ICD-10-CM | POA: Insufficient documentation

## 2024-07-06 DIAGNOSIS — N189 Chronic kidney disease, unspecified: Secondary | ICD-10-CM | POA: Diagnosis not present

## 2024-07-06 DIAGNOSIS — W010XXA Fall on same level from slipping, tripping and stumbling without subsequent striking against object, initial encounter: Secondary | ICD-10-CM | POA: Insufficient documentation

## 2024-07-06 DIAGNOSIS — Z794 Long term (current) use of insulin: Secondary | ICD-10-CM | POA: Insufficient documentation

## 2024-07-06 DIAGNOSIS — I130 Hypertensive heart and chronic kidney disease with heart failure and stage 1 through stage 4 chronic kidney disease, or unspecified chronic kidney disease: Secondary | ICD-10-CM | POA: Insufficient documentation

## 2024-07-06 DIAGNOSIS — I5030 Unspecified diastolic (congestive) heart failure: Secondary | ICD-10-CM | POA: Diagnosis not present

## 2024-07-06 DIAGNOSIS — W19XXXA Unspecified fall, initial encounter: Secondary | ICD-10-CM

## 2024-07-06 DIAGNOSIS — J45909 Unspecified asthma, uncomplicated: Secondary | ICD-10-CM | POA: Insufficient documentation

## 2024-07-06 MED ORDER — ACETAMINOPHEN 500 MG PO TABS
1000.0000 mg | ORAL_TABLET | Freq: Once | ORAL | Status: AC
  Administered 2024-07-06: 1000 mg via ORAL
  Filled 2024-07-06: qty 2

## 2024-07-06 NOTE — ED Triage Notes (Signed)
 Pt BIB after a fall today. Pt states pain in the right shoulder and small skin tears in the right elbow. Pt denies LOC, hitting his head, no blood thinners. HX of DM. VS stable

## 2024-07-06 NOTE — Discharge Instructions (Signed)
 Thank you for coming to [facility] Emergency Department. You were seen for fall. We did an exam, and imaging, and these showed no acute fracture or dislocation in the chest or right arm.  You can take tylenol  1,000 mg every 8 hours for pain or discomfort. You can use antibiotic ointment twice per day on the abrasions.   Please follow up with your primary care provider within 1 week.   Do not hesitate to return to the ED or call 911 if you experience: -Worsening symptoms -Increased headache, confusion, stroke-like symptoms -Lightheadedness, passing out -Fevers/chills -Anything else that concerns you

## 2024-07-06 NOTE — ED Provider Notes (Signed)
 [place] EMERGENCY DEPARTMENT AT [facility] Provider Note   CSN: [id number] Arrival date & time: 07/06/24  2028     History  Chief Complaint  Patient presents with   George Boyer    JYAIRE KOUDELKA is a 73 y.o. male with PMH as listed below who presents with BIB after a fall today. Lives at [facility] care unit. Presents with wife and daughter who provide additional history. Tripped over a piece of carpet today at his facility while walking with walker and fell into the desk with his right shoulder. Reports pain in right shoulder/humerus elbow and right anterior chest wall. Pt states pain in the right shoulder and small skin tears in the right elbow. Endorses some mild shortness of breath which is also chronically intermittent for him.   Family at bedside state that he is acting at his baseline. No blood thinners. Unsure if he hit his head but did not lose consciousness.    Past Medical History:  Diagnosis Date   Anxiety    Asthma    Atrial fibrillation (HCC)    Cellulitis of right hand 06/04/2020   CHF (congestive heart failure) (HCC)    Chronic kidney disease (CKD)    Colon polyps    Coronary artery disease    Diabetes mellitus without complication (HCC)    Diastolic heart failure (HCC)    Gallstones    HLD (hyperlipidemia)    Hypertension    IDA (iron  deficiency anemia)    Iron  deficiency anemia 01/2024   Obesity    Pneumonia    Sleep apnea treated with continuous positive airway pressure (CPAP)        Home Medications Prior to Admission medications   Medication Sig Start Date End Date Taking? Authorizing Provider  ACCU-CHEK AVIVA PLUS test strip USE TO TEST BLOOD SUGARS FOUR TIMES DAILY DX E11.9 AND Z79.4 09/03/18   [provider]  Accu-Chek FastClix Lancets MISC Use to monitor blood glucose 4 time(s) daily DX CODE E11.9 and Z79.4 02/08/19   [provider]  B-D UF III MINI PEN NEEDLES 31G X 5 MM MISC Use to test Blood Sugar three  time daily Dx: E11.42 04/22/24   [name], [name], NP  blood glucose meter kit and supplies KIT Dispense based on patient and insurance preference. Use up to four times daily as directed.Accu check Dx:E11.42 03/14/24   [name], [name], NP  Blood Glucose Monitoring Suppl (ACCU-CHEK AVIVA PLUS) w/Device KIT Use to test blood sugar four times daily. Dx: E11.42 Patient not taking: Reported on 06/19/2024 03/14/24   [name], [name], NP  clonazePAM  (KLONOPIN ) 0.5 MG tablet TAKE 1 TABLET BY MOUTH AT BEDTIME. 06/12/24   [name], [name], NP  hydrOXYzine  (VISTARIL ) 25 MG capsule Take 1 capsule (25 mg total) by mouth at bedtime. 06/05/24   [name], NP  Insulin  Syringe-Needle U-100 (INSULIN  SYRINGE 1CC/31GX5/16) 31G X 5/16 1 ML MISC Use with insulin  five (5) times daily  DX E11.42 and Z79.4 02/08/19   [provider]  nitroGLYCERIN  (NITROSTAT ) 0.4 MG SL tablet Place 1 tablet (0.4 mg total) under the tongue every 5 (five) minutes as needed for chest pain. 12/14/23   [name], [name], NP  olopatadine  (PATADAY ) 0.1 % ophthalmic solution Place 1 drop into both eyes 2 (two) times daily as needed for allergies. 06/05/24   [name], NP  TOUJEO  SOLOSTAR 300 UNIT/ML Solostar Pen INJECT 25 UNITS INTO THE SKIN IN THE MORNING AND AT BEDTIME. 05/07/24  [name], [name], NP  Vitamin D , Ergocalciferol , (DRISDOL ) 1.25 MG (50000 UNIT) CAPS capsule TAKE 1 CAPSULE BY MOUTH ONE TIME PER WEEK 06/12/24   [name], [name], NP      Allergies    Sunflower oil, Bupropion , Glucophage [metformin hcl], Ozempic (0.25 or 0.5 mg-dose) [semaglutide(0.25 or 0.5mg -dos)], and Semaglutide    Review of Systems   Review of Systems A 10 point review of systems was performed and is negative unless otherwise reported in HPI.  Physical Exam Updated Vital Signs BP (!) 172/77 (BP Location: Left Arm)   Pulse 71   Temp 97.8 F (36.6 C) (Oral)   Resp 20   SpO2 99%  Physical Exam General: Normal appearing eldelry male, lying in  bed.  HEENT: NCAT, PERRLA, Sclera anicteric, MMM, trachea midline. No e/o facial trauma.  Cardiology: RRR, no murmurs/rubs/gallops. Mild right anterior chest wall TTP without any deformities or crepitus.  Resp: Normal respiratory rate and effort. CTAB, no wheezes, rhonchi, crackles.  Abd: Soft, non-tender, non-distended. No rebound tenderness or guarding.  Pelvis: Pelvis stable/nontender MSK: TTP to R distal humerus and less so diffusely throughout right shoulder. Small abrasions noted to right elbow. Intact AROM/PROM though with pain. No peripheral edema or signs of trauma. NVI, intact distal pulses and sensation. No compartment swelling.  Skin: warm, dry.  Back: No CVA tenderness. No midline C T or L spine TTP deformities or step-offs.  Neuro: A&Ox4, CNs II-XII grossly intact. MAEs. Sensation grossly intact.  Psych: Normal mood and affect.   ED Results / Procedures / Treatments   Labs (all labs ordered are listed, but only abnormal results are displayed) Labs Reviewed - No data to display  EKG None  Radiology DG Elbow 2 Views Right Result Date: 07/06/2024 CLINICAL DATA:  Fall, right elbow pain. EXAM: RIGHT ELBOW - 2 VIEW COMPARISON:  Elbow series earlier today. FINDINGS: No visible fracture, subluxation or dislocation. No joint effusion. Vascular calcifications noted. IMPRESSION: No acute bony abnormality or joint effusion. Electronically Signed   By: Franky Crease M.D.   On: 07/06/2024 22:26   DG Chest Portable 1 View Result Date: 07/06/2024 CLINICAL DATA:  Chest pain, right anterior after a fall EXAM: PORTABLE CHEST 1 VIEW COMPARISON:  CT chest 04/09/2024 and radiograph 04/09/2024 FINDINGS: Stable cardiomegaly. No focal consolidation, pleural effusion, or pneumothorax. No displaced rib fractures. IMPRESSION: No active disease. Electronically Signed   By: Norman Gatlin M.D.   On: 07/06/2024 22:22   DG Elbow 2 Views Right Result Date: 07/06/2024 CLINICAL DATA:  Fall today. Pain in the  right shoulder and small skin tears to the right elbow EXAM: RIGHT ELBOW - 2 VIEW; RIGHT HUMERUS - 2+ VIEW; RIGHT SHOULDER - 2+ VIEW COMPARISON:  Shoulder radiographs 02/21/2013 FINDINGS: No evidence of acute fracture or dislocation in the right shoulder or humerus. No definite elbow fracture though evaluation is limited by unusual obliquity for lateral view. No definite elbow joint effusion or dislocation. Degenerative arthritis AC and glenohumeral joints. IMPRESSION: No evidence of acute fracture or dislocation in the right shoulder or humerus. No definite elbow joint fracture or dislocation noting limitation of unusual lateral obliquity. If desired, a repeat lateral could be obtained and an addendum added to this report. Electronically Signed   By: Norman Gatlin M.D.   On: 07/06/2024 21:47   DG Humerus Right Result Date: 07/06/2024 CLINICAL DATA:  Fall today. Pain in the right shoulder and small skin tears to the right elbow EXAM: RIGHT ELBOW - 2 VIEW;  RIGHT HUMERUS - 2+ VIEW; RIGHT SHOULDER - 2+ VIEW COMPARISON:  Shoulder radiographs 02/21/2013 FINDINGS: No evidence of acute fracture or dislocation in the right shoulder or humerus. No definite elbow fracture though evaluation is limited by unusual obliquity for lateral view. No definite elbow joint effusion or dislocation. Degenerative arthritis AC and glenohumeral joints. IMPRESSION: No evidence of acute fracture or dislocation in the right shoulder or humerus. No definite elbow joint fracture or dislocation noting limitation of unusual lateral obliquity. If desired, a repeat lateral could be obtained and an addendum added to this report. Electronically Signed   By: Norman Gatlin M.D.   On: 07/06/2024 21:47   DG Shoulder Right Result Date: 07/06/2024 CLINICAL DATA:  Fall today. Pain in the right shoulder and small skin tears to the right elbow EXAM: RIGHT ELBOW - 2 VIEW; RIGHT HUMERUS - 2+ VIEW; RIGHT SHOULDER - 2+ VIEW COMPARISON:  Shoulder  radiographs 02/21/2013 FINDINGS: No evidence of acute fracture or dislocation in the right shoulder or humerus. No definite elbow fracture though evaluation is limited by unusual obliquity for lateral view. No definite elbow joint effusion or dislocation. Degenerative arthritis AC and glenohumeral joints. IMPRESSION: No evidence of acute fracture or dislocation in the right shoulder or humerus. No definite elbow joint fracture or dislocation noting limitation of unusual lateral obliquity. If desired, a repeat lateral could be obtained and an addendum added to this report. Electronically Signed   By: Norman Gatlin M.D.   On: 07/06/2024 21:47    Procedures Procedures    Medications Ordered in ED Medications - No data to display  ED Course/ Medical Decision Making/ A&P                          Medical Decision Making Amount and/or Complexity of Data Reviewed Radiology: ordered. Decision-making details documented in ED Course.    This patient presents to the ED for concern of fall with right upper extremity and right anterior chest pain, this involves an extensive number of treatment options, and is a complaint that carries with it a high risk of complications and morbidity.  Patient is overall at his baseline and complaining of pain in the right upper extremity  MDM:    DDX for trauma includes but is not limited to:  -Head Injury such as skull fx or ICH -patient reported to triage that he did not hit his head but with me told me he was not sure if he hit his head.  Did not lose consciousness.  There is no evidence of trauma on his face or head and patient has no focal neurodeficits.  Patient is under hospice care.  Discussed with his wife and daughter at bedside that even if he did have an ICH that there would likely not be any means for intervention.  I do believe that head trauma is unlikely and discussed with patient's family, shared decision making, will not perform a CT head  today. -Chest Injury -does report anterior chest pain.  No evidence of trauma or crepitus/instability.  Chest x-ray is negative for any obvious displaced rib fractures, hemo or pneumothorax.  Patient does not have any hypoxia, tachypnea on exam and is resting comfortably. -Vertebral injury - no midline C-spine TTP deformities or stepoffs -Fractures -does have 2 abrasions in the right elbow and tenderness especially around the elbow but x-rays of the right shoulder humerus and elbow do not demonstrate any joint effusion or bony abnormality.  Likely soft tissue contusion.  Advised to take Tylenol  1000 mg every 8 hours for discomfort and to RICE.  Patient's wife and daughter at bedside report understanding of the negative workup today and states that patient is at his baseline mental status.  He will be transported back to [facility].   Clinical Course as of 07/06/24 2314  Sat Jul 06, 2024  2150 DG Elbow 2 Views Right No evidence of acute fracture or dislocation in the right shoulder or humerus.  No definite elbow joint fracture or dislocation noting limitation of unusual lateral obliquity. If desired, a repeat lateral could be obtained and an addendum added to this report.   [HN]  2230 DG Elbow 2 Views Right No acute bony abnormality or joint effusion. [HN]  2230 DG Chest Portable 1 View No active disease. [HN]    Clinical Course User Index [HN] Franklyn Sid SAILOR, MD    Imaging Studies ordered: I ordered imaging studies including CXR, RUE XRs I independently visualized and interpreted imaging. I agree with the radiologist interpretation  Additional history obtained from chart review, family at bedside.    Reevaluation: After the interventions noted above, I reevaluated the patient and found that they have :stayed the same  Social Determinants of Health: Lives at [facility], under hospice care  Disposition:  DC w/ discharge instructions/return precautions. All questions  answered to patient's wife's and daughter's satisfaction.  Will be transported back to his facility  Co morbidities that complicate the patient evaluation  Past Medical History:  Diagnosis Date   Anxiety    Asthma    Atrial fibrillation (HCC)    Cellulitis of right hand 06/04/2020   CHF (congestive heart failure) (HCC)    Chronic kidney disease (CKD)    Colon polyps    Coronary artery disease    Diabetes mellitus without complication (HCC)    Diastolic heart failure (HCC)    Gallstones    HLD (hyperlipidemia)    Hypertension    IDA (iron  deficiency anemia)    Iron  deficiency anemia 01/2024   Obesity    Pneumonia    Sleep apnea treated with continuous positive airway pressure (CPAP)      Medicines No orders of the defined types were placed in this encounter.   I have reviewed the patients home medicines and have made adjustments as needed  Problem List / ED Course: Problem List Items Addressed This Visit   None Visit Diagnoses       Fall in home, initial encounter    -  Primary     Right elbow pain                       This note was created using dictation software, which may contain spelling or grammatical errors.    Franklyn Sid SAILOR, MD 07/06/24 [phone]

## 2024-07-23 ENCOUNTER — Other Ambulatory Visit: Payer: Self-pay | Admitting: Adult Health

## 2024-07-23 DIAGNOSIS — H1013 Acute atopic conjunctivitis, bilateral: Secondary | ICD-10-CM

## 2024-07-27 ENCOUNTER — Other Ambulatory Visit: Payer: Self-pay | Admitting: Adult Health

## 2024-07-27 DIAGNOSIS — H1013 Acute atopic conjunctivitis, bilateral: Secondary | ICD-10-CM

## 2024-08-13 ENCOUNTER — Ambulatory Visit: Payer: Medicare Other | Admitting: Podiatry

## 2024-08-25 ENCOUNTER — Other Ambulatory Visit: Payer: Self-pay | Admitting: Family

## 2024-08-25 DIAGNOSIS — H1013 Acute atopic conjunctivitis, bilateral: Secondary | ICD-10-CM

## 2024-08-26 NOTE — Telephone Encounter (Signed)
 Patient last seen by Tawni America, NP.

## 2024-09-13 ENCOUNTER — Ambulatory Visit: Admitting: Family

## 2024-09-23 ENCOUNTER — Encounter: Payer: Self-pay | Admitting: Radiology

## 2024-10-25 ENCOUNTER — Other Ambulatory Visit: Payer: Self-pay

## 2024-10-25 ENCOUNTER — Emergency Department (HOSPITAL_COMMUNITY)
Admission: EM | Admit: 2024-10-25 | Discharge: 2024-10-25 | Disposition: A | Discharge status: Skilled Nursing Facility | Attending: Emergency Medicine | Admitting: Emergency Medicine

## 2024-10-25 ENCOUNTER — Emergency Department (HOSPITAL_COMMUNITY)

## 2024-10-25 DIAGNOSIS — F0390 Unspecified dementia without behavioral disturbance: Secondary | ICD-10-CM | POA: Diagnosis not present

## 2024-10-25 DIAGNOSIS — W19XXXA Unspecified fall, initial encounter: Secondary | ICD-10-CM

## 2024-10-25 DIAGNOSIS — W1839XA Other fall on same level, initial encounter: Secondary | ICD-10-CM | POA: Diagnosis not present

## 2024-10-25 DIAGNOSIS — M25551 Pain in right hip: Secondary | ICD-10-CM | POA: Insufficient documentation

## 2024-10-25 NOTE — Discharge Instructions (Signed)
Please follow-up with your family doctor in the office. °

## 2024-10-25 NOTE — Progress Notes (Signed)
 [facility]  [facility] Liaison Note  This patient is a current hospice patient with [facility], admitted with a terminal diagnosis of cerebral vascular disease.  Patient is a DNR and Karanvir Balderston  [phone] is the patient's primary caregiver.  We will continue to follow for any discharge planning needs and to coordinate continuation of hospice care.  Please don't hesitate to call with any hospice related questions or concerns.  Thank you for the opportunity to participate in this patient's care.  Inocente Jacobs RN BSN [facility] Liaison [phone]

## 2024-10-25 NOTE — ED Provider Notes (Signed)
 [place] EMERGENCY DEPARTMENT AT [facility] Provider Note   CSN: [id number] Arrival date & time: 10/25/24  1659     Patient presents with: Fall and Hip Pain (/)   George Boyer is a 73 y.o. male.   73 yo M with a complaints of a fall.  Patient reportedly lost his balance and fell at a facility.  Landed on his right side.  Complaining of right hip pain.  Was able to get up and walk afterwards.  Patient has dementia.  He is not sure exactly what happened.  Thinks he hit his head.   Fall  Hip Pain       Prior to Admission medications   Medication Sig Start Date End Date Taking? Authorizing Provider  ACCU-CHEK AVIVA PLUS test strip USE TO TEST BLOOD SUGARS FOUR TIMES DAILY DX E11.9 AND Z79.4 09/03/18   [provider]  Accu-Chek FastClix Lancets MISC Use to monitor blood glucose 4 time(s) daily DX CODE E11.9 and Z79.4 02/08/19   [provider]  B-D UF III MINI PEN NEEDLES 31G X 5 MM MISC Use to test Blood Sugar three time daily Dx: E11.42 04/22/24   [name], [name], NP  blood glucose meter kit and supplies KIT Dispense based on patient and insurance preference. Use up to four times daily as directed.Accu check Dx:E11.42 03/14/24   [name], [name], NP  Blood Glucose Monitoring Suppl (ACCU-CHEK AVIVA PLUS) w/Device KIT Use to test blood sugar four times daily. Dx: E11.42 Patient not taking: Reported on 06/19/2024 03/14/24   [name], [name], NP  clonazePAM  (KLONOPIN ) 0.5 MG tablet TAKE 1 TABLET BY MOUTH AT BEDTIME. 06/12/24   [name], [name], NP  hydrOXYzine  (VISTARIL ) 25 MG capsule TAKE 1 CAPSULE BY MOUTH EVERYDAY AT BEDTIME 08/26/24   [name], NP  Insulin  Syringe-Needle U-100 (INSULIN  SYRINGE 1CC/31GX5/16) 31G X 5/16 1 ML MISC Use with insulin  five (5) times daily  DX E11.42 and Z79.4 02/08/19   [provider]  nitroGLYCERIN  (NITROSTAT ) 0.4 MG SL tablet Place 1 tablet (0.4 mg total) under the tongue every 5 (five) minutes as needed for  chest pain. 12/14/23   [name], [name], NP  olopatadine  (PATADAY ) 0.1 % ophthalmic solution Place 1 drop into both eyes 2 (two) times daily as needed for allergies. 06/05/24   [name], NP  TOUJEO  SOLOSTAR 300 UNIT/ML Solostar Pen INJECT 25 UNITS INTO THE SKIN IN THE MORNING AND AT BEDTIME. 05/07/24   [name], [name], NP  Vitamin D , Ergocalciferol , (DRISDOL ) 1.25 MG (50000 UNIT) CAPS capsule TAKE 1 CAPSULE BY MOUTH ONE TIME PER WEEK 06/12/24   [name], [name], NP    Allergies: Sunflower oil, Bupropion , Glucophage [metformin hcl], Ozempic (0.25 or 0.5 mg-dose) [semaglutide(0.25 or 0.5mg -dos)], and Semaglutide    Review of Systems  Updated Vital Signs BP 133/67   Pulse 75   Temp 98.1 F (36.7 C)   Resp 17   Ht 5' 8 (1.727 m)   Wt 83.9 kg   SpO2 100%   BMI 28.13 kg/m   Physical Exam Vitals and nursing note reviewed.  Constitutional:      Appearance: He is well-developed.  HENT:     Head: Normocephalic and atraumatic.  Eyes:     Pupils: Pupils are equal, round, and reactive to light.  Neck:     Vascular: No JVD.  Cardiovascular:     Rate and Rhythm: Normal rate and regular rhythm.     Heart sounds: No  murmur heard.    No friction rub. No gallop.  Pulmonary:     Effort: No respiratory distress.     Breath sounds: No wheezing.  Abdominal:     General: There is no distension.     Tenderness: There is no abdominal tenderness. There is no guarding or rebound.  Musculoskeletal:        General: Normal range of motion.     Cervical back: Normal range of motion and neck supple.     Comments: No obvious pain with internal/external rotation of the right lower extremity.  Pulse motor and sensation intact distally.  Palpated from head to toe without any other obvious noted areas of bony tenderness  Skin:    Coloration: Skin is not pale.     Findings: No rash.  Neurological:     Mental Status: He is alert and oriented to person, place, and time.  Psychiatric:         Behavior: Behavior normal.     (all labs ordered are listed, but only abnormal results are displayed) Labs Reviewed - No data to display  EKG: None  Radiology: CT Head Wo Contrast Result Date: 10/25/2024 EXAM: CT HEAD AND CERVICAL SPINE 10/25/2024 05:58:23 PM TECHNIQUE: CT of the head and cervical spine was performed without the administration of intravenous contrast. Multiplanar reformatted images are provided for review. Automated exposure control, iterative reconstruction, and/or weight based adjustment of the mA/kV was utilized to reduce the radiation dose to as low as reasonably achievable. COMPARISON: Comparison from 04/09/2024 CLINICAL HISTORY: Head trauma, minor (Age >= 65y) FINDINGS: CT HEAD BRAIN AND VENTRICLES: No acute intracranial hemorrhage. No mass effect or midline shift. No abnormal extra-axial fluid collection. No evidence of acute infarct. No hydrocephalus. Watch ORBITS: No acute abnormality. SINUSES AND MASTOIDS: No acute abnormality. SOFT TISSUES AND SKULL: No acute skull fracture. No acute soft tissue abnormality. CT CERVICAL SPINE BONES AND ALIGNMENT: Nondisplaced right T1 and T2 transverse process fractures that are favored subacute or chronic. Although subtle, these fractures probably were present on May 20th CT of the cervical spine. No traumatic malalignment. DEGENERATIVE CHANGES: Moderate multilevel degenerative change including degenerative disc disease and facet/uncovertebral hypertrophy. SOFT TISSUES: No prevertebral soft tissue swelling. IMPRESSION: 1. No acute intracranial abnormality. 2. No convincing acute fracture or traumatic malalignment in the cervical spine. 3. Nondisplaced right T1 and T2 transverse process fractures that are favored subacute or chronic. Although subtle, these fractures probably were present and acuteon May 20th CT of the cervical spine. Electronically signed by: Gilmore Molt MD 10/25/2024 06:28 PM EST RP Workstation: [id number]   CT Cervical  Spine Wo Contrast Result Date: 10/25/2024 EXAM: CT HEAD AND CERVICAL SPINE 10/25/2024 05:58:23 PM TECHNIQUE: CT of the head and cervical spine was performed without the administration of intravenous contrast. Multiplanar reformatted images are provided for review. Automated exposure control, iterative reconstruction, and/or weight based adjustment of the mA/kV was utilized to reduce the radiation dose to as low as reasonably achievable. COMPARISON: Comparison from 04/09/2024 CLINICAL HISTORY: Head trauma, minor (Age >= 65y) FINDINGS: CT HEAD BRAIN AND VENTRICLES: No acute intracranial hemorrhage. No mass effect or midline shift. No abnormal extra-axial fluid collection. No evidence of acute infarct. No hydrocephalus. Watch ORBITS: No acute abnormality. SINUSES AND MASTOIDS: No acute abnormality. SOFT TISSUES AND SKULL: No acute skull fracture. No acute soft tissue abnormality. CT CERVICAL SPINE BONES AND ALIGNMENT: Nondisplaced right T1 and T2 transverse process fractures that are favored subacute or chronic. Although subtle, these fractures probably  were present on May 20th CT of the cervical spine. No traumatic malalignment. DEGENERATIVE CHANGES: Moderate multilevel degenerative change including degenerative disc disease and facet/uncovertebral hypertrophy. SOFT TISSUES: No prevertebral soft tissue swelling. IMPRESSION: 1. No acute intracranial abnormality. 2. No convincing acute fracture or traumatic malalignment in the cervical spine. 3. Nondisplaced right T1 and T2 transverse process fractures that are favored subacute or chronic. Although subtle, these fractures probably were present and acuteon May 20th CT of the cervical spine. Electronically signed by: Gilmore Molt MD 10/25/2024 06:28 PM EST RP Workstation: [id number]   DG Hip Unilat W or Wo Pelvis 2-3 Views Right Result Date: 10/25/2024 EXAM: 2 or 3 VIEW(S) XRAY OF THE RIGHT HIP 10/25/2024 05:51:30 PM COMPARISON: 11/30/2023 CLINICAL HISTORY: hip  pain post fall FINDINGS: BONES AND JOINTS: No acute fracture. No malalignment. SOFT TISSUES: The soft tissues are unremarkable. IMPRESSION: 1. No acute fracture or dislocation of the right hip or visualized pelvis. Electronically signed by: Lynwood Seip MD 10/25/2024 06:06 PM EST RP Workstation: [id number]     Procedures   Medications Ordered in the ED - No data to display                                  Medical Decision Making Amount and/or Complexity of Data Reviewed Radiology: ordered.   73 yo M with a chief complaint of a fall.  Nonsyncopal by history.  Complaining of right hip pain.  Will obtain a plain film.  CT head and C-spine.  Plain film of the right hip on my independent interpretation without fracture.  CT of the head and C-spine without acute intracranial or intra C-spine pathology.  Will discharge back to his facility.  7:30 PM:  I have discussed the diagnosis/risks/treatment options with the patient.  Evaluation and diagnostic testing in the emergency department does not suggest an emergent condition requiring admission or immediate intervention beyond what has been performed at this time.  They will follow up with PCP. We also discussed returning to the ED immediately if new or worsening sx occur. We discussed the sx which are most concerning (e.g., sudden worsening pain, fever, inability to tolerate by mouth) that necessitate immediate return. Medications administered to the patient during their visit and any new prescriptions provided to the patient are listed below.  Medications given during this visit Medications - No data to display   The patient appears reasonably screen and/or stabilized for discharge and I doubt any other medical condition or other [facility] requiring further screening, evaluation, or treatment in the ED at this time prior to discharge.       Final diagnoses:  Fall, initial encounter    ED Discharge Orders     None          Emil Share, DO 10/25/24 1930

## 2024-10-25 NOTE — ED Triage Notes (Signed)
 Patient bib EMS from a [facility]. He is alert to self only. He was brought in after a unwitnessed fall at the facility. Unsure if he hit his head or had a LOC. No bruises or hematomas on head. Staff at facility states he was grabbing his right hip but he was able to walk after the fall. No deformities to the hip, no shortening of the leg or outer rotation. EMS reports pain on palpation. He was brought in in a c-collar by EMS.   No thinners, has a history of A-fib

## 2024-10-29 ENCOUNTER — Encounter: Payer: Self-pay | Admitting: Gastroenterology

## 2024-11-10 ENCOUNTER — Other Ambulatory Visit: Payer: Self-pay

## 2024-11-10 ENCOUNTER — Encounter (HOSPITAL_COMMUNITY): Payer: Self-pay

## 2024-11-10 ENCOUNTER — Emergency Department (HOSPITAL_COMMUNITY)
Admission: EM | Admit: 2024-11-10 | Discharge: 2024-11-11 | Disposition: A | Discharge status: Hospice | Attending: Emergency Medicine | Admitting: Emergency Medicine

## 2024-11-10 ENCOUNTER — Emergency Department (HOSPITAL_COMMUNITY)

## 2024-11-10 DIAGNOSIS — S0101XA Laceration without foreign body of scalp, initial encounter: Secondary | ICD-10-CM | POA: Diagnosis not present

## 2024-11-10 DIAGNOSIS — S0990XA Unspecified injury of head, initial encounter: Secondary | ICD-10-CM | POA: Diagnosis present

## 2024-11-10 DIAGNOSIS — W19XXXA Unspecified fall, initial encounter: Secondary | ICD-10-CM | POA: Insufficient documentation

## 2024-11-10 DIAGNOSIS — Z23 Encounter for immunization: Secondary | ICD-10-CM | POA: Insufficient documentation

## 2024-11-10 MED ORDER — TETANUS-DIPHTH-ACELL PERTUSSIS 5-2-15.5 LF-MCG/0.5 IM SUSP
0.5000 mL | Freq: Once | INTRAMUSCULAR | Status: AC
  Administered 2024-11-10: 0.5 mL via INTRAMUSCULAR
  Filled 2024-11-10: qty 0.5

## 2024-11-10 MED ORDER — LIDOCAINE-EPINEPHRINE-TETRACAINE (LET) TOPICAL GEL
3.0000 mL | Freq: Once | TOPICAL | Status: AC
  Administered 2024-11-10: 3 mL via TOPICAL
  Filled 2024-11-10: qty 3

## 2024-11-10 NOTE — ED Provider Notes (Signed)
 " [place] EMERGENCY DEPARTMENT AT [facility] Provider Note   CSN: [id number] Arrival date & time: 11/10/24  8042     Patient presents with: Fall (BIBA from hospice, [facility] care unit, for unwitnessed fall.  No thinners since August.  Sustained approc 3-4 cm laceration/hematoma to head.  Spouse at bedside.  Baseline AxOX1)   George Boyer is a 73 y.o. male.  Presents today from hospice after a unwitnessed fall.  Patient has laceration/hematoma to head.  Patient at baseline orientated to only self.  Patient has not been on blood thinners since August.    Fall       Prior to Admission medications  Medication Sig Start Date End Date Taking? Authorizing Provider  ACCU-CHEK AVIVA PLUS test strip USE TO TEST BLOOD SUGARS FOUR TIMES DAILY DX E11.9 AND Z79.4 09/03/18   [provider]  Accu-Chek FastClix Lancets MISC Use to monitor blood glucose 4 time(s) daily DX CODE E11.9 and Z79.4 02/08/19   [provider]  B-D UF III MINI PEN NEEDLES 31G X 5 MM MISC Use to test Blood Sugar three time daily Dx: E11.42 04/22/24   [name], [name], NP  blood glucose meter kit and supplies KIT Dispense based on patient and insurance preference. Use up to four times daily as directed.Accu check Dx:E11.42 03/14/24   [name], [name], NP  Blood Glucose Monitoring Suppl (ACCU-CHEK AVIVA PLUS) w/Device KIT Use to test blood sugar four times daily. Dx: E11.42 Patient not taking: Reported on 06/19/2024 03/14/24   [name], [name], NP  clonazePAM  (KLONOPIN ) 0.5 MG tablet TAKE 1 TABLET BY MOUTH AT BEDTIME. 06/12/24   [name], [name], NP  hydrOXYzine  (VISTARIL ) 25 MG capsule TAKE 1 CAPSULE BY MOUTH EVERYDAY AT BEDTIME 08/26/24   [name], NP  Insulin  Syringe-Needle U-100 (INSULIN  SYRINGE 1CC/31GX5/16) 31G X 5/16 1 ML MISC Use with insulin  five (5) times daily  DX E11.42 and Z79.4 02/08/19   [provider]  nitroGLYCERIN  (NITROSTAT ) 0.4 MG SL tablet Place 1  tablet (0.4 mg total) under the tongue every 5 (five) minutes as needed for chest pain. 12/14/23   [name], [name], NP  olopatadine  (PATADAY ) 0.1 % ophthalmic solution Place 1 drop into both eyes 2 (two) times daily as needed for allergies. 06/05/24   Wert, Christina, NP  TOUJEO  SOLOSTAR 300 UNIT/ML Solostar Pen INJECT 25 UNITS INTO THE SKIN IN THE MORNING AND AT BEDTIME. 05/07/24   [name], [name], NP  Vitamin D , Ergocalciferol , (DRISDOL ) 1.25 MG (50000 UNIT) CAPS capsule TAKE 1 CAPSULE BY MOUTH ONE TIME PER WEEK 06/12/24   [name], [name], NP    Allergies: Sunflower oil, Bupropion , Glucophage [metformin hcl], Ozempic (0.25 or 0.5 mg-dose) [semaglutide(0.25 or 0.5mg -dos)], and Semaglutide    Review of Systems  Updated Vital Signs BP (!) 153/72 (BP Location: Right Arm)   Pulse 83   Temp (!) 97 F (36.1 C) (Oral)   Resp 11   Ht 5' 8 (1.727 m)   Wt 83.9 kg   SpO2 100% Comment: Simultaneous filing. User may not have seen previous data.  BMI 28.12 kg/m   Physical Exam Vitals and nursing note reviewed.  Constitutional:      General: He is not in acute distress.    Appearance: He is well-developed. He is not toxic-appearing.  HENT:     Head: Normocephalic. No raccoon eyes or Battle's sign.      Comments: Large semicircular in shape approximately 9 cm  laceration as noted above.  With active oozing on exam.    Mouth/Throat:     Mouth: Mucous membranes are moist.  Eyes:     Conjunctiva/sclera: Conjunctivae normal.  Cardiovascular:     Rate and Rhythm: Normal rate and regular rhythm.     Pulses: Normal pulses.  Pulmonary:     Effort: Pulmonary effort is normal. No respiratory distress.  Abdominal:     Palpations: Abdomen is soft.     Tenderness: There is no abdominal tenderness.  Musculoskeletal:        General: No swelling.     Cervical back: Normal range of motion and neck supple.  Skin:    General: Skin is warm and dry.     Capillary Refill: Capillary refill takes less than 2  seconds.  Neurological:     Mental Status: He is alert. Mental status is at baseline.  Psychiatric:        Mood and Affect: Mood normal.     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 Multiplanar reformatted images are provided for review. Automated exposure control, iterative reconstruction, and/or weight based adjustment of the mA/kV was utilized to reduce the radiation dose to as low as reasonably achievable. COMPARISON: Comparison with 10/25/2024 CLINICAL HISTORY: Neck trauma (Age >= 65y) FINDINGS: BONES AND ALIGNMENT: No acute fracture or traumatic malalignment. DEGENERATIVE CHANGES: No significant change from 10/25/2024. Disc space height loss and degenerative endplate changes are greatest at C3-C4 and C6-C7 where there are moderate to advanced. No severe spinal canal narrowing. SOFT TISSUES: No prevertebral soft tissue swelling. IMPRESSION: 1. No evidence of acute traumatic injury. Electronically signed by: Norman Gatlin MD 11/10/2024 09:36 PM EST RP Workstation: [id number]   CT Head Wo Contrast Result Date: 11/10/2024 EXAM: CT HEAD WITHOUT CONTRAST 11/10/2024 09:20:52 PM TECHNIQUE: CT of the head was performed without the administration of intravenous contrast. Automated exposure control, iterative reconstruction, and/or weight based adjustment of the mA/kV was utilized to reduce the  radiation dose to as low as reasonably achievable. COMPARISON: 10/25/2024 CLINICAL HISTORY: Head trauma, minor (Age >= 65y) FINDINGS: BRAIN AND VENTRICLES: No acute hemorrhage. No evidence of acute infarct. No hydrocephalus. No extra-axial collection. No mass effect or midline shift. Moderate periventricular and subcortical white matter hypoattenuation, consistent with moderate chronic ischemic microvascular disease. Basal ganglia calcifications. ORBITS: No acute abnormality. SINUSES: No acute abnormality. SOFT TISSUES AND SKULL: Right frontotemporal scalp hematoma. Left frontal scalp laceration. No skull fracture. Advanced vascular calcifications at skull base. IMPRESSION: 1. No acute intracranial abnormality. 2. Right frontotemporal scalp hematoma. No calvarial fracture. Electronically signed by: Norman Gatlin MD 11/10/2024 09:33 PM EST RP Workstation: [id number]     .Laceration Repair  Date/Time: 11/10/2024 11:49 PM  Performed by: Francis Ileana SAILOR, PA-C Authorized by: Francis Ileana SAILOR, PA-C   Consent:    Consent obtained:  Verbal   Consent given by:  Patient   Risks discussed:  Pain, poor cosmetic  result, poor wound healing and infection   Alternatives discussed:  No treatment Universal protocol:    Imaging studies available: yes     Patient identity confirmed:  Arm band Anesthesia:    Anesthesia method:  Topical application   Topical anesthetic:  LET Laceration details:    Location:  Scalp   Scalp location:  Frontal   Length (cm):  9   Depth (mm):  4 Exploration:    Hemostasis achieved with:  Direct pressure   Imaging outcome: foreign body not noted   Treatment:    Area cleansed with:  Saline   Amount of cleaning:  Extensive Skin repair:    Repair method:  Staples   Number of staples:  19 Approximation:    Approximation:  Close Repair type:    Repair type:  Simple Post-procedure details:    Dressing:  Open (no dressing)   Procedure completion:  Tolerated with difficulty     Medications Ordered in the ED  lidocaine -EPINEPHrine -tetracaine  (LET) topical gel (3 mLs Topical Given 11/10/24 2257)  Tdap (ADACEL ) injection 0.5 mL (0.5 mLs Intramuscular Given 11/10/24 2257)                                    Medical Decision Making Amount and/or Complexity of Data Reviewed Radiology: ordered.  Risk Prescription drug management.   This patient presents to the ED for concern of fall differential diagnosis includes laceration, abrasion, brain bleed, concussion, musculoskeletal pain    Additional history obtained Additional history obtained from Electronic Medical Record External records from outside source obtained and reviewed including medicine notes   Lab Tests:  I Ordered, and personally interpreted labs.  The pertinent results include: EKG which showed A-fib   Imaging Studies ordered:  I ordered imaging studies including CT head and C-spine Noncon I independently visualized and interpreted imaging which showed no acute intracranial abnormality.  Right frontotemporal scalp hematoma.  No calvarial fracture.  No evidence of acute traumatic injury to the C-spine I agree with the radiologist interpretation   Medicines ordered and prescription drug management:  I ordered medication including Tdap, LET gel    I have reviewed the patients home medicines and have made adjustments as needed   Problem List / ED Course:  Considered for admission or further workup however patient's vital signs, physical exam, and imaging are reassuring.  Patient's laceration was closed using staples and patient's Tdap was updated.  Return precautions given.  I feel patient is safe for discharge at this time.      Final diagnoses:  Fall, initial encounter  Laceration of scalp, initial encounter    ED Discharge Orders     None          Francis Ileana SAILOR, PA-C 11/10/24 2354  "

## 2024-11-10 NOTE — Discharge Instructions (Addendum)
 Today you were seen after a fall.  Your workup on the emergency department was reassuring.  Please be evaluated for staple removal in 7 to 10 days.  Please return to the ED if you have signs of infection to include puslike discharge, red streaking around the wound, or worsening pain.  Thank you for letting us  treat you today. After reviewing your imaging, I feel you are safe to go home. Please follow up with your PCP in the next several days and provide them with your records from this visit. Return to the Emergency Room if pain becomes severe or symptoms worsen.

## 2024-11-10 NOTE — ED Notes (Signed)
Suture cart outside patient room

## 2024-11-10 NOTE — ED Triage Notes (Signed)
 BIBA from hospice, [facility] care unit, for unwitnessed fall.  No thinners since August.  Sustained approc 3-4 cm laceration/hematoma to head.  Spouse at bedside.  Baseline AxOX1

## 2024-11-10 NOTE — ED Notes (Signed)
 First poc with patient. Pt axox1.  Per spouse and daughter whom arrived to bedside, close to baseline.  PT remains in C-Collar, provided blanket. Pt remains on continuous cardiac, pulse ox and bp monitoring. No respiratory distress noted.  Family updated on plan of care, awaiting MDOs.

## 2024-11-11 NOTE — ED Notes (Signed)
 [facility] called for transportation back to [facility] unit.  Family left bedside, provided address and phone number to facility
